# Patient Record
Sex: Male | Born: 1940 | Race: White | Hispanic: No | Marital: Single | Smoking: Former smoker
Health system: Southern US, Community
[De-identification: ages and names within clinical notes are randomized; demographics above are authoritative.]

## PROBLEM LIST (undated history)

## (undated) DIAGNOSIS — F329 Major depressive disorder, single episode, unspecified: Secondary | ICD-10-CM

## (undated) DIAGNOSIS — F419 Anxiety disorder, unspecified: Secondary | ICD-10-CM

## (undated) DIAGNOSIS — F32A Depression, unspecified: Secondary | ICD-10-CM

## (undated) DIAGNOSIS — E7800 Pure hypercholesterolemia, unspecified: Secondary | ICD-10-CM

## (undated) DIAGNOSIS — I10 Essential (primary) hypertension: Secondary | ICD-10-CM

## (undated) HISTORY — PX: WRIST FRACTURE SURGERY: SHX121

## (undated) HISTORY — PX: OTHER SURGICAL HISTORY: SHX169

---

## 1988-02-02 HISTORY — PX: SHOULDER SURGERY: SHX246

## 2008-10-19 ENCOUNTER — Emergency Department (HOSPITAL_COMMUNITY): Admission: EM | Admit: 2008-10-19 | Discharge: 2008-10-19 | Payer: Self-pay | Admitting: Emergency Medicine

## 2008-11-07 ENCOUNTER — Encounter: Admission: RE | Admit: 2008-11-07 | Discharge: 2008-11-07 | Payer: Self-pay | Admitting: Orthopedic Surgery

## 2008-11-27 ENCOUNTER — Encounter: Admission: RE | Admit: 2008-11-27 | Discharge: 2009-01-29 | Payer: Self-pay | Admitting: Neurology

## 2009-02-03 ENCOUNTER — Encounter: Admission: RE | Admit: 2009-02-03 | Discharge: 2009-02-05 | Payer: Self-pay | Admitting: Neurology

## 2009-02-25 ENCOUNTER — Encounter: Admission: RE | Admit: 2009-02-25 | Discharge: 2009-02-25 | Payer: Self-pay | Admitting: *Deleted

## 2009-06-20 ENCOUNTER — Encounter: Admission: RE | Admit: 2009-06-20 | Discharge: 2009-06-20 | Payer: Self-pay | Admitting: *Deleted

## 2010-10-03 IMAGING — CT CT CERVICAL SPINE W/O CM
3 of 4 series · 16 of 28 positions shown, 18 images · non-contrast
Comparison: MRI 11/07/2008.

CLINICAL DATA: Radiculitis.  Evaluate for nonunion.  Right arm
pain.  Prior cervical fusion.

CT CERVICAL SPINE WITHOUT CONTRAST
TECHNIQUE: Multidetector CT imaging of the cervical spine was
performed. Multiplanar CT image reconstructions were also
generated.

[Series 2: c spine bone · axial · 0.27mm/px · z∈[+73,+211]mm · 5 of 83 slices shown, 7 images]
[im 14/83  soft-tissue]
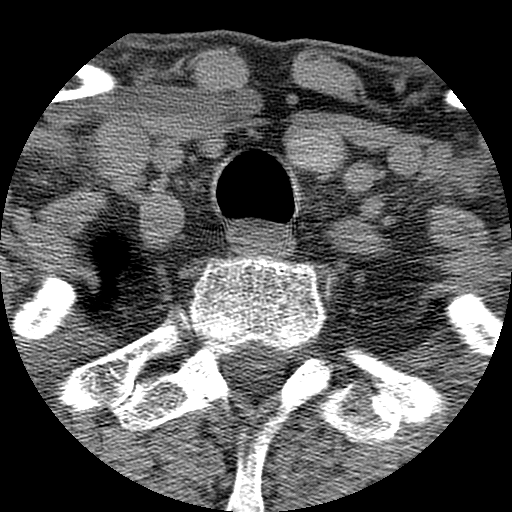
[im 14/83  bone]
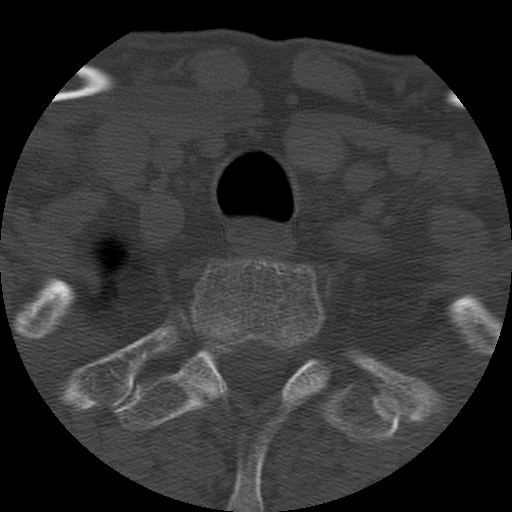
[im 28/83  bone]
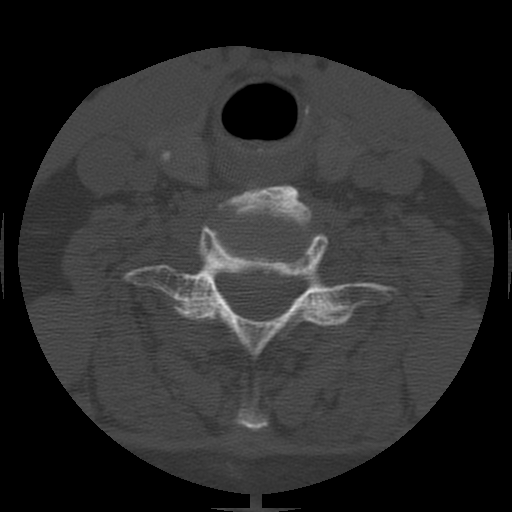
[im 42/83  bone]
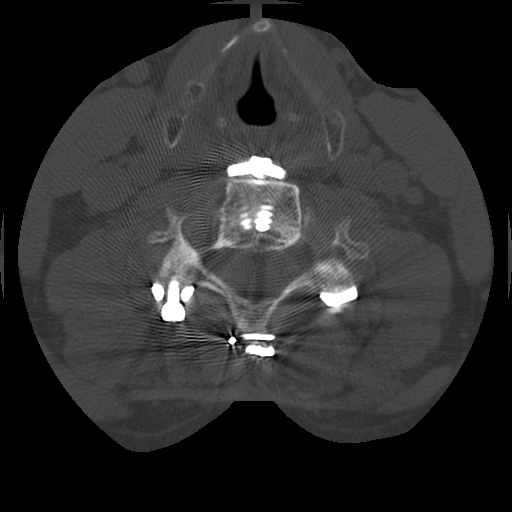
[im 55/83  bone]
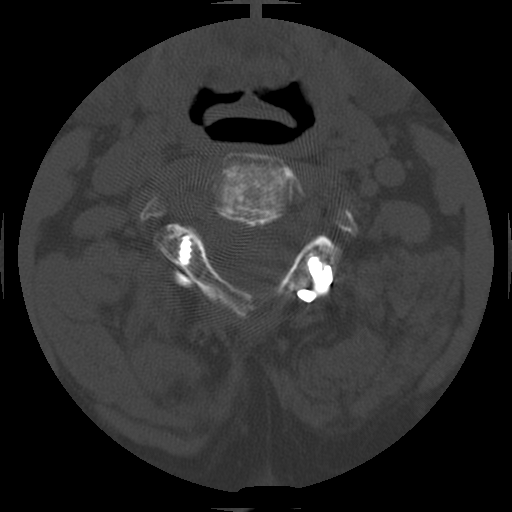
[im 69/83  soft-tissue]
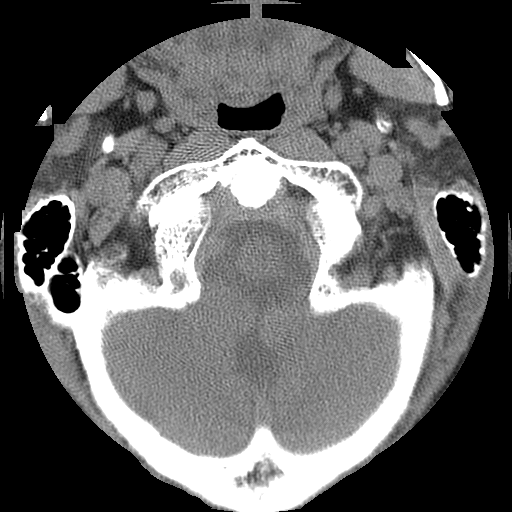
[im 69/83  bone]
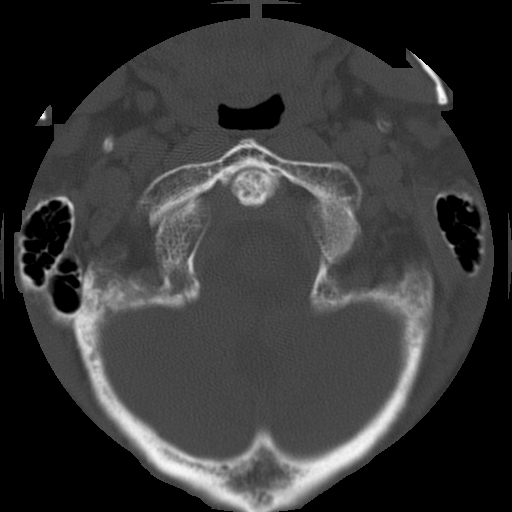

[Series 3: c spine soft · axial · 0.27mm/px · z∈[+68,+211]mm · 5 of 84 slices shown]
[im 14/84  soft-tissue]
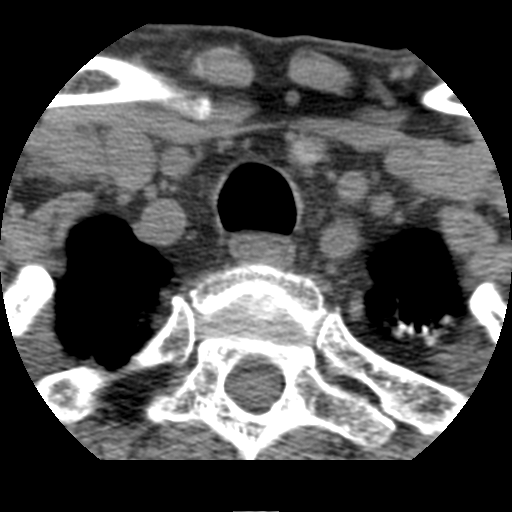
[im 28/84  soft-tissue]
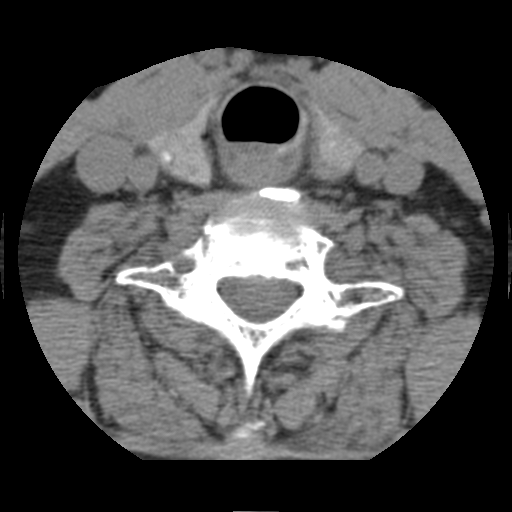
[im 42/84  soft-tissue]
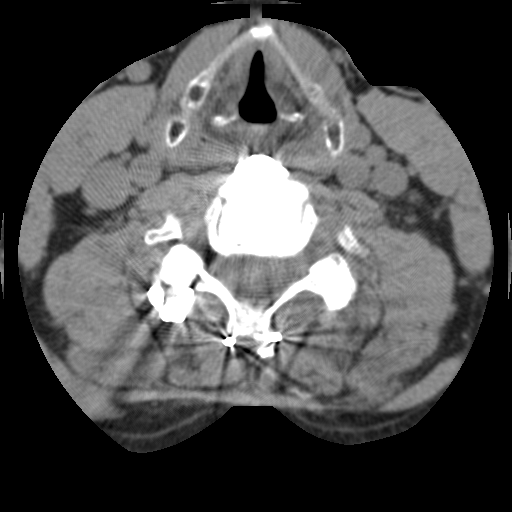
[im 56/84  soft-tissue]
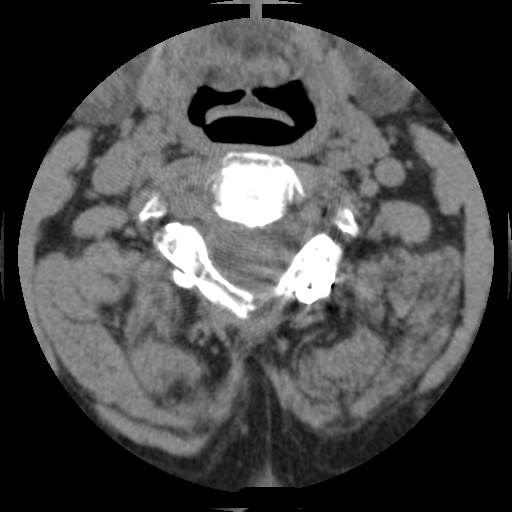
[im 70/84  soft-tissue]
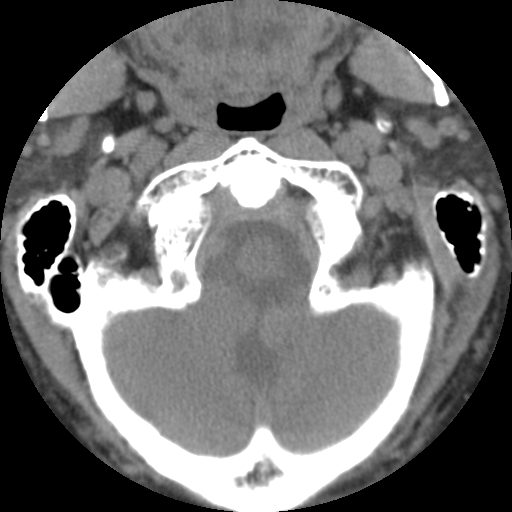

[Series 400: coronal · coronal · 0.42mm/px · 6 of 40 slices shown]
[im 2/40  soft-tissue]
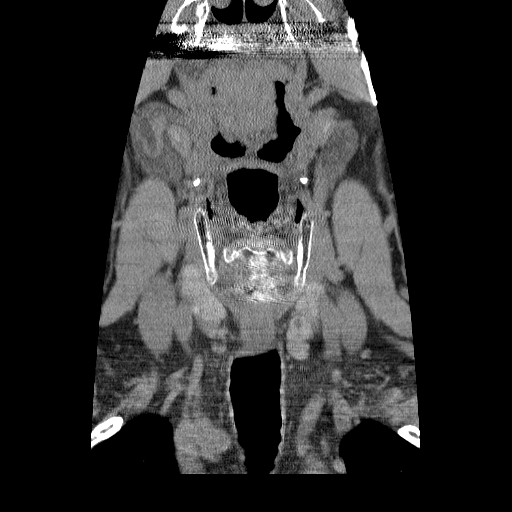
[im 7/40  bone]
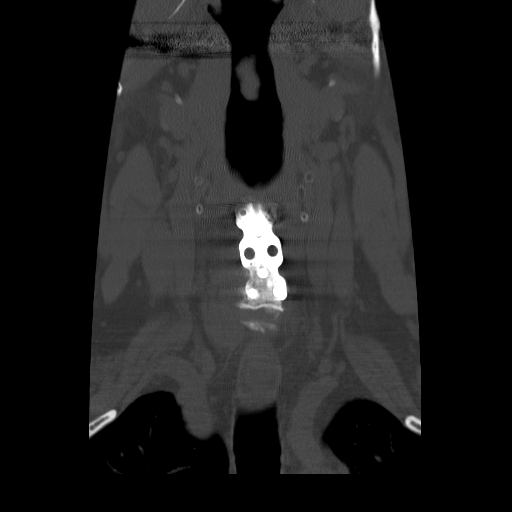
[im 14/40  bone]
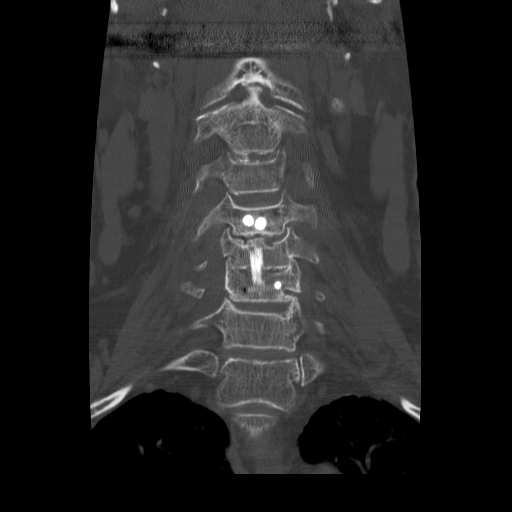
[im 20/40  bone]
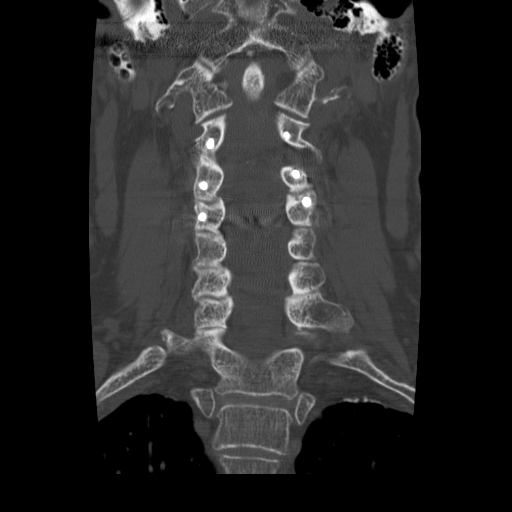
[im 27/40  bone]
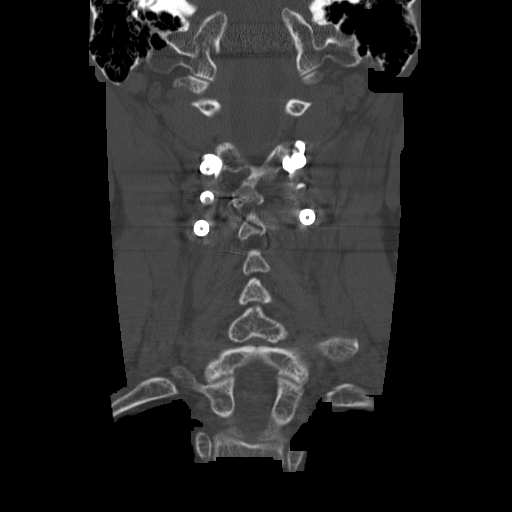
[im 33/40  bone]
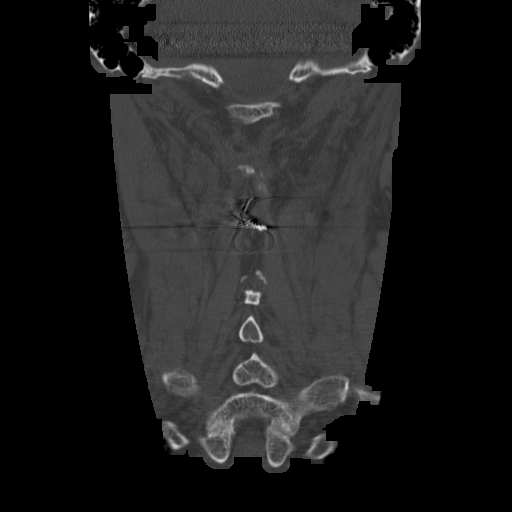

[16 of 28 positions shown; findings below may reference images not displayed]

FINDINGS: Normal cervical alignment.  No fracture or acute bony
abnormality.  Postsurgical changes and fusion are similar to the
prior study.

ACDF of C4-5 and C5-6.  Posterior screw and plate fusion of C2-C4.
Posterior cerclage wires at C3-4.  Hardware is similar to the prior
study.

C2-3:  Disc degeneration and mild spurring without stenosis.

C3-4:  Mild disc degeneration and spurring without stenosis.

C4-5:  Anterior plate and screws are unchanged.  There is lack of
bony fusion at this level compatible with pseudoarthrosis.  This is
unchanged from the CT of 10/19/2008.

C5-6:  Solid fusion.  There is incorporated bone graft material
which is unchanged.

C6-7:  Disc degeneration with broad-based central disc protrusion
and mild uncovertebral spurring.  There is mild spinal stenosis.
This is similar to the prior study.

C7-T1:  Negative

Multiple small thyroid nodules are present bilaterally.  There is
calcification of one of the nodules on the right.  This is
unchanged.
IMPRESSION: There is pseudoarthrosis of the ACDF at C4-5.  There is a solid
fusion at C5-6.  Posterior hardware at C2-C4 is unchanged appears
satisfactory.

Broad-based disc protrusion and spondylosis at C6-7 is unchanged
from the prior study.

## 2013-06-26 ENCOUNTER — Emergency Department (HOSPITAL_COMMUNITY): Payer: No Typology Code available for payment source

## 2013-06-26 ENCOUNTER — Emergency Department (HOSPITAL_COMMUNITY)
Admission: EM | Admit: 2013-06-26 | Discharge: 2013-06-26 | Disposition: A | Discharge status: Home / Self Care | Payer: No Typology Code available for payment source | Attending: Emergency Medicine | Admitting: Emergency Medicine

## 2013-06-26 ENCOUNTER — Encounter (HOSPITAL_COMMUNITY): Payer: Self-pay | Admitting: Emergency Medicine

## 2013-06-26 DIAGNOSIS — Z8639 Personal history of other endocrine, nutritional and metabolic disease: Secondary | ICD-10-CM | POA: Insufficient documentation

## 2013-06-26 DIAGNOSIS — S72409A Unspecified fracture of lower end of unspecified femur, initial encounter for closed fracture: Secondary | ICD-10-CM | POA: Diagnosis not present

## 2013-06-26 DIAGNOSIS — S0990XA Unspecified injury of head, initial encounter: Secondary | ICD-10-CM | POA: Diagnosis not present

## 2013-06-26 DIAGNOSIS — F411 Generalized anxiety disorder: Secondary | ICD-10-CM | POA: Insufficient documentation

## 2013-06-26 DIAGNOSIS — Y92410 Unspecified street and highway as the place of occurrence of the external cause: Secondary | ICD-10-CM | POA: Insufficient documentation

## 2013-06-26 DIAGNOSIS — S199XXA Unspecified injury of neck, initial encounter: Secondary | ICD-10-CM

## 2013-06-26 DIAGNOSIS — S8990XA Unspecified injury of unspecified lower leg, initial encounter: Secondary | ICD-10-CM | POA: Diagnosis not present

## 2013-06-26 DIAGNOSIS — I959 Hypotension, unspecified: Secondary | ICD-10-CM | POA: Insufficient documentation

## 2013-06-26 DIAGNOSIS — Z981 Arthrodesis status: Secondary | ICD-10-CM | POA: Insufficient documentation

## 2013-06-26 DIAGNOSIS — S298XXA Other specified injuries of thorax, initial encounter: Secondary | ICD-10-CM | POA: Diagnosis not present

## 2013-06-26 DIAGNOSIS — S0993XA Unspecified injury of face, initial encounter: Secondary | ICD-10-CM | POA: Diagnosis not present

## 2013-06-26 DIAGNOSIS — S72401A Unspecified fracture of lower end of right femur, initial encounter for closed fracture: Secondary | ICD-10-CM

## 2013-06-26 DIAGNOSIS — Y9389 Activity, other specified: Secondary | ICD-10-CM | POA: Insufficient documentation

## 2013-06-26 DIAGNOSIS — R61 Generalized hyperhidrosis: Secondary | ICD-10-CM | POA: Insufficient documentation

## 2013-06-26 DIAGNOSIS — S3981XA Other specified injuries of abdomen, initial encounter: Secondary | ICD-10-CM | POA: Diagnosis not present

## 2013-06-26 DIAGNOSIS — IMO0002 Reserved for concepts with insufficient information to code with codable children: Secondary | ICD-10-CM | POA: Diagnosis not present

## 2013-06-26 DIAGNOSIS — Z862 Personal history of diseases of the blood and blood-forming organs and certain disorders involving the immune mechanism: Secondary | ICD-10-CM | POA: Insufficient documentation

## 2013-06-26 DIAGNOSIS — M25569 Pain in unspecified knee: Secondary | ICD-10-CM | POA: Diagnosis not present

## 2013-06-26 HISTORY — DX: Depression, unspecified: F32.A

## 2013-06-26 HISTORY — DX: Pure hypercholesterolemia, unspecified: E78.00

## 2013-06-26 HISTORY — DX: Major depressive disorder, single episode, unspecified: F32.9

## 2013-06-26 LAB — I-STAT CHEM 8, ED
BUN: 19 mg/dL (ref 6–23)
CHLORIDE: 103 meq/L (ref 96–112)
Calcium, Ion: 1.16 mmol/L (ref 1.13–1.30)
Creatinine, Ser: 1.4 mg/dL — ABNORMAL HIGH (ref 0.50–1.35)
GLUCOSE: 170 mg/dL — AB (ref 70–99)
HEMATOCRIT: 47 % (ref 39.0–52.0)
HEMOGLOBIN: 16 g/dL (ref 13.0–17.0)
POTASSIUM: 3.2 meq/L — AB (ref 3.7–5.3)
SODIUM: 143 meq/L (ref 137–147)
TCO2: 19 mmol/L (ref 0–100)

## 2013-06-26 LAB — COMPREHENSIVE METABOLIC PANEL
ALBUMIN: 4.4 g/dL (ref 3.5–5.2)
ALT: 18 U/L (ref 0–53)
AST: 19 U/L (ref 0–37)
Alkaline Phosphatase: 63 U/L (ref 39–117)
BILIRUBIN TOTAL: 0.9 mg/dL (ref 0.3–1.2)
BUN: 20 mg/dL (ref 6–23)
CHLORIDE: 103 meq/L (ref 96–112)
CO2: 20 mEq/L (ref 19–32)
CREATININE: 1.29 mg/dL (ref 0.50–1.35)
Calcium: 9.7 mg/dL (ref 8.4–10.5)
GFR calc Af Amer: 63 mL/min — ABNORMAL LOW (ref >90)
GFR calc non Af Amer: 55 mL/min — ABNORMAL LOW (ref >90)
Glucose, Bld: 172 mg/dL — ABNORMAL HIGH (ref 70–99)
POTASSIUM: 3.5 meq/L — AB (ref 3.7–5.3)
Sodium: 140 mEq/L (ref 137–147)
TOTAL PROTEIN: 7.2 g/dL (ref 6.0–8.3)

## 2013-06-26 LAB — SAMPLE TO BLOOD BANK

## 2013-06-26 LAB — CBC
HEMATOCRIT: 44.3 % (ref 39.0–52.0)
Hemoglobin: 15.4 g/dL (ref 13.0–17.0)
MCH: 29 pg (ref 26.0–34.0)
MCHC: 34.8 g/dL (ref 30.0–36.0)
MCV: 83.4 fL (ref 78.0–100.0)
PLATELETS: 133 10*3/uL — AB (ref 150–400)
RBC: 5.31 MIL/uL (ref 4.22–5.81)
RDW: 13.9 % (ref 11.5–15.5)
WBC: 5.4 10*3/uL (ref 4.0–10.5)

## 2013-06-26 LAB — CBG MONITORING, ED
GLUCOSE-CAPILLARY: 176 mg/dL — AB (ref 70–99)
Glucose-Capillary: 112 mg/dL — ABNORMAL HIGH (ref 70–99)

## 2013-06-26 LAB — TROPONIN I: Troponin I: <0.3 ng/mL (ref <0.30)

## 2013-06-26 LAB — ETHANOL: Alcohol, Ethyl (B): <11 mg/dL (ref 0–11)

## 2013-06-26 LAB — PROTIME-INR
INR: 0.97 (ref 0.00–1.49)
Prothrombin Time: 12.7 seconds (ref 11.6–15.2)

## 2013-06-26 LAB — CDS SEROLOGY

## 2013-06-26 MED ORDER — ONDANSETRON 4 MG PO TBDP: 4.0000 mg | ORAL_TABLET | Freq: Four times a day (QID) | ORAL | Status: DC | PRN

## 2013-06-26 MED ORDER — DOCUSATE SODIUM 100 MG PO CAPS: 100.0000 mg | ORAL_CAPSULE | Freq: Two times a day (BID) | ORAL | Status: DC

## 2013-06-26 MED ORDER — ONDANSETRON 4 MG PO TBDP
4.0000 mg | ORAL_TABLET | Freq: Once | ORAL | Status: AC
  Administered 2013-06-26: 4 mg via ORAL
  Filled 2013-06-26: qty 1

## 2013-06-26 MED ORDER — SODIUM CHLORIDE 0.9 % IV BOLUS (SEPSIS)
500.0000 mL | Freq: Once | INTRAVENOUS | Status: AC
  Administered 2013-06-26: 500 mL via INTRAVENOUS

## 2013-06-26 MED ORDER — MORPHINE SULFATE 4 MG/ML IJ SOLN
4.0000 mg | Freq: Once | INTRAMUSCULAR | Status: AC
  Administered 2013-06-26: 4 mg via INTRAVENOUS
  Filled 2013-06-26: qty 1

## 2013-06-26 MED ORDER — IOHEXOL 300 MG/ML  SOLN
80.0000 mL | Freq: Once | INTRAMUSCULAR | Status: AC | PRN
  Administered 2013-06-26: 80 mL via INTRAVENOUS

## 2013-06-26 MED ORDER — ONDANSETRON HCL 4 MG/2ML IJ SOLN
4.0000 mg | Freq: Once | INTRAMUSCULAR | Status: AC
Start: 2013-06-26 — End: 2013-06-26
  Administered 2013-06-26: 4 mg via INTRAVENOUS
  Filled 2013-06-26: qty 2

## 2013-06-26 MED ORDER — OXYCODONE-ACETAMINOPHEN 5-325 MG PO TABS: 1.0000 | ORAL_TABLET | ORAL | Status: DC | PRN

## 2013-06-26 NOTE — Discharge Instructions (Signed)
Fracture A fracture is a break in a bone, due to a force on the bone that is greater than the bone's strength can handle. There are many types of fractures, including:  Complete fracture: The break passes completely through the bone.  Displaced: The ends of the bone fragments are not properly aligned.  Non-displaced: The ends of the bone fragments are in proper alignment.  Incomplete fracture (greenstick): The break does not pass completely through the bone. Incomplete fractures may or may not be angular (angulated).  Open fracture (compound): Part of the broken bone pokes through the skin. Open fractures have a high risk for infection.  Closed fracture: The fracture has not broken through the skin.  Comminuted fracture: The bone is broken into more than two pieces.  Compression fracture: The break occurs from extreme pressure on the bone (includes crushing injury).  Impacted fracture: The broken bone ends have been driven into each other.  Avulsion fracture: A ligament or tendon pulls a small piece of bone off from the main bony segment.  Pathologic fracture: A fracture due to the bone being made weak by a disease (osteoporosis or tumors).  Stress fracture: A fracture caused by intense exercise or repetitive and prolonged pressure that makes the bone weak. SYMPTOMS   Pain, tenderness, bleeding, bruising, and swelling at the fracture site.  Weakness and inability to bear weight on the injured extremity.  Paleness and deformity (sometimes).  Loss of pulse, numbness, tingling, or paralysis below the fracture site (usually a limb); these are emergencies. CAUSES  Bone being subjected to a force greater than its strength. RISK INCREASES WITH:  Contact sports and falls from heights.  Previous or current bone problems (osteoporosis or tumors).  Poor balance.  Poor strength and flexibility. PREVENTION   Warm up and stretch properly before activity.  Maintain physical  fitness:  Cardiovascular fitness.  Muscle strength.  Flexibility and endurance.  Wear proper protective equipment.  Use proper exercise technique. RELATED COMPLICATIONS   Bone fails to heal (nonunion).  Bone heals in a poor position (malunion).  Low blood volume (hypovolemic), shock due to blood loss.  Clump of fat cells travels through the blood (fat embolus) from the injury site to the lungs or brain (more common with thigh fractures).  Obstruction of nearby arteries. TREATMENT  Treatment first requires realigning of the bones (reduction) by a medically trained person, if the fracture is displaced. After realignment if the fracture is completed, or for non-displaced fractures, ice and medicine are used to reduce pain and inflammation. The bone and adjacent joints are then restrained with a splint, cast, or brace to allow the bones to heal without moving. Surgery is sometimes needed, to reposition the bones and hold the position with rods, pins, plates, or screws. Restraint for long periods of time may result in muscle and joint weakness or build up of fluid in tissues (edema). For this reason, physical therapy is often needed to regain strength and full range of motion. Recovery is complete when there is no bone motion at the fracture site and x-rays (radiographs) show complete healing.  MEDICATION   General anesthesia, sedation, or muscle relaxants may be needed to allow for realignment of the fracture. If pain medicine is needed, nonsteroidal anti-inflammatory medicines (aspirin and ibuprofen), or other minor pain relievers (acetaminophen), are often advised.  Do not take pain medicine for 7 days before surgery.  Stronger pain relievers may be prescribed by your caregiver. Use only as directed and only as much   as you need. SEEK MEDICAL CARE IF:   The following occur after restraint or surgery. (Report any of these signs immediately):  Swelling above or below the fracture  site.  Severe, persistent pain.  Blue or gray skin below the fracture site, especially under the nails. Numbness or loss of feeling below the fracture site. Document Released: 01/18/2005 Document Revised: 01/05/2012 Document Reviewed: 05/02/2008 [employer] Patient Information 2014 [employer].  

## 2013-06-26 NOTE — ED Notes (Signed)
RN notified [facility] [id number]

## 2013-06-26 NOTE — ED Notes (Signed)
Pt transported to radiology.

## 2013-06-26 NOTE — Progress Notes (Signed)
Orthopedic Tech Progress Note Patient Details:  Kevin Santiago 1941-01-29 [id number]  Ortho Devices Type of Ortho Device: Crutches;Knee Immobilizer Ortho Device/Splint Interventions: Application   [name] 06/26/2013, 1:40 PM
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
## 2013-06-26 NOTE — ED Notes (Signed)
After getting dressed and walking pt became nauseated, pale, and diaphoretic. Pt placed back in bed and more v/s obtained. HR 69 and BP 137/75. EDP made aware. Zofran given. Will continue to assess.

## 2013-06-26 NOTE — ED Notes (Signed)
After getting dressed Patient stated he felt ill.  Patient became very pale.   Helped  Patient back into stretcher. Notified RN.
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
## 2013-06-26 NOTE — ED Notes (Signed)
Pt reports he was feeling better then he ambulated around the room and became diaphoretic and nauseated again. Pt placed back in bed and EDP notified.

## 2013-06-26 NOTE — ED Provider Notes (Signed)
Ambulated with the tech and had no acute complaints. No nausea, worsening pain, or trouble with the crutches. He feels comfortable going home. Discussed return precautions and will give pain control and he will follow up with ortho  Audree Camel, MD 06/26/13 [phone]

## 2013-06-26 NOTE — ED Notes (Signed)
Notified RN of [facility] 112

## 2013-06-26 NOTE — ED Notes (Signed)
Assisted pt with crutches to ambulate out of the room into the hallway, pt stated " this is more than i've done the first two times."

## 2013-06-28 DIAGNOSIS — M79609 Pain in unspecified limb: Secondary | ICD-10-CM | POA: Diagnosis not present

## 2013-06-30 ENCOUNTER — Emergency Department (INDEPENDENT_AMBULATORY_CARE_PROVIDER_SITE_OTHER)
Admission: EM | Admit: 2013-06-30 | Discharge: 2013-06-30 | Disposition: A | Discharge status: Home / Self Care | Payer: Self-pay | Source: Home / Self Care | Attending: Family Medicine | Admitting: Family Medicine

## 2013-06-30 ENCOUNTER — Encounter (HOSPITAL_COMMUNITY): Payer: Self-pay | Admitting: Emergency Medicine

## 2013-06-30 DIAGNOSIS — K625 Hemorrhage of anus and rectum: Secondary | ICD-10-CM

## 2013-06-30 DIAGNOSIS — K649 Unspecified hemorrhoids: Secondary | ICD-10-CM

## 2013-06-30 MED ORDER — HYDROCORTISONE 2.5 % RE CREA: 1.0000 "application " | TOPICAL_CREAM | Freq: Two times a day (BID) | RECTAL | Status: DC

## 2013-06-30 NOTE — Discharge Instructions (Signed)
Hemorrhoids Hemorrhoids are puffy (swollen) veins around the rectum or anus. Hemorrhoids can cause pain, itching, bleeding, or irritation. HOME CARE  Eat foods with fiber, such as whole grains, beans, nuts, fruits, and vegetables. Ask your doctor about taking products with added fiber in them (fibersupplements).  Drink enough fluid to keep your pee (urine) clear or pale yellow.  Exercise often.  Go to the bathroom when you have the urge to poop. Do not wait.  Avoid straining to poop (bowel movement).  Keep the butt area dry and clean. Use wet toilet paper or moist paper towels.  Medicated creams and medicine inserted into the anus (anal suppository) may be used or applied as told.  Only take medicine as told by your doctor.  Take a warm water bath (sitz bath) for 15 20 minutes to ease pain. Do this 3 4 times a day.  Place ice packs on the area if it is tender or puffy. Use the ice packs between the warm water baths.  Put ice in a plastic bag.  Place a towel between your skin and the bag.  Leave the ice on for 15-20 minutes, 03-04 times a day.  Do not use a donut-shaped pillow or sit on the toilet for a long time. GET HELP RIGHT AWAY IF:   You have more pain that is not controlled by treatment or medicine.  You have bleeding that will not stop.  You have trouble or are unable to poop (bowel movement).  You have pain or puffiness outside the area of the hemorrhoids. MAKE SURE YOU:   Understand these instructions.  Will watch your condition.  Will get help right away if you are not doing well or get worse. Document Released: 10/28/2007 Document Revised: 01/05/2012 Document Reviewed: 11/30/2011 [facility] Patient Information 2014 [place], [place].  Treatment of constipation is the mainstay. Continue use of stool softner. Drink a lot of fluids and increase fiber in diet all of which are essential. Should the bleeding continue out past 1-2 weeks, keep f/u with primary  doctor.
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
## 2013-07-01 NOTE — ED Provider Notes (Signed)
Medical screening examination/treatment/procedure(s) were performed by resident physician or non-physician practitioner and as supervising physician I was immediately available for consultation/collaboration.   Barkley Bruns MD.   Linna Hoff, MD 07/01/13 [phone]

## 2013-07-12 DIAGNOSIS — IMO0002 Reserved for concepts with insufficient information to code with codable children: Secondary | ICD-10-CM | POA: Diagnosis not present

## 2013-07-20 DIAGNOSIS — M1710 Unilateral primary osteoarthritis, unspecified knee: Secondary | ICD-10-CM | POA: Diagnosis not present

## 2013-08-28 DIAGNOSIS — M1710 Unilateral primary osteoarthritis, unspecified knee: Secondary | ICD-10-CM | POA: Diagnosis not present

## 2014-05-13 DIAGNOSIS — M2241 Chondromalacia patellae, right knee: Secondary | ICD-10-CM | POA: Diagnosis not present

## 2014-07-01 ENCOUNTER — Emergency Department (HOSPITAL_COMMUNITY): Payer: Medicare Other

## 2014-07-01 ENCOUNTER — Inpatient Hospital Stay (HOSPITAL_COMMUNITY)
Admission: EM | Admit: 2014-07-01 | Discharge: 2014-07-05 | DRG: 814 | Disposition: A | Discharge status: Home / Self Care | Payer: Medicare Other | Attending: Internal Medicine | Admitting: Internal Medicine

## 2014-07-01 ENCOUNTER — Encounter (HOSPITAL_COMMUNITY): Payer: Self-pay | Admitting: Nurse Practitioner

## 2014-07-01 DIAGNOSIS — K661 Hemoperitoneum: Secondary | ICD-10-CM | POA: Diagnosis present

## 2014-07-01 DIAGNOSIS — D72819 Decreased white blood cell count, unspecified: Secondary | ICD-10-CM | POA: Diagnosis not present

## 2014-07-01 DIAGNOSIS — S36030A Superficial (capsular) laceration of spleen, initial encounter: Principal | ICD-10-CM | POA: Diagnosis present

## 2014-07-01 DIAGNOSIS — Z91013 Allergy to seafood: Secondary | ICD-10-CM | POA: Diagnosis not present

## 2014-07-01 DIAGNOSIS — D61818 Other pancytopenia: Secondary | ICD-10-CM | POA: Diagnosis not present

## 2014-07-01 DIAGNOSIS — D709 Neutropenia, unspecified: Secondary | ICD-10-CM | POA: Diagnosis present

## 2014-07-01 DIAGNOSIS — Z880 Allergy status to penicillin: Secondary | ICD-10-CM

## 2014-07-01 DIAGNOSIS — N400 Enlarged prostate without lower urinary tract symptoms: Secondary | ICD-10-CM | POA: Diagnosis present

## 2014-07-01 DIAGNOSIS — E780 Pure hypercholesterolemia: Secondary | ICD-10-CM | POA: Diagnosis present

## 2014-07-01 DIAGNOSIS — R109 Unspecified abdominal pain: Secondary | ICD-10-CM | POA: Diagnosis not present

## 2014-07-01 DIAGNOSIS — D696 Thrombocytopenia, unspecified: Secondary | ICD-10-CM | POA: Diagnosis present

## 2014-07-01 DIAGNOSIS — F329 Major depressive disorder, single episode, unspecified: Secondary | ICD-10-CM | POA: Diagnosis present

## 2014-07-01 DIAGNOSIS — E785 Hyperlipidemia, unspecified: Secondary | ICD-10-CM | POA: Diagnosis present

## 2014-07-01 DIAGNOSIS — E559 Vitamin D deficiency, unspecified: Secondary | ICD-10-CM | POA: Diagnosis present

## 2014-07-01 DIAGNOSIS — S36031A Moderate laceration of spleen, initial encounter: Secondary | ICD-10-CM | POA: Diagnosis not present

## 2014-07-01 DIAGNOSIS — R112 Nausea with vomiting, unspecified: Secondary | ICD-10-CM | POA: Diagnosis not present

## 2014-07-01 DIAGNOSIS — R58 Hemorrhage, not elsewhere classified: Secondary | ICD-10-CM | POA: Diagnosis not present

## 2014-07-01 DIAGNOSIS — S3600XA Unspecified injury of spleen, initial encounter: Secondary | ICD-10-CM | POA: Diagnosis present

## 2014-07-01 DIAGNOSIS — R161 Splenomegaly, not elsewhere classified: Secondary | ICD-10-CM | POA: Diagnosis not present

## 2014-07-01 DIAGNOSIS — N179 Acute kidney failure, unspecified: Secondary | ICD-10-CM | POA: Diagnosis present

## 2014-07-01 DIAGNOSIS — R7303 Prediabetes: Secondary | ICD-10-CM | POA: Diagnosis present

## 2014-07-01 DIAGNOSIS — R509 Fever, unspecified: Secondary | ICD-10-CM | POA: Diagnosis not present

## 2014-07-01 DIAGNOSIS — R1012 Left upper quadrant pain: Secondary | ICD-10-CM | POA: Diagnosis not present

## 2014-07-01 LAB — URINE MICROSCOPIC-ADD ON

## 2014-07-01 LAB — TYPE AND SCREEN
ABO/RH(D): A POS
ANTIBODY SCREEN: NEGATIVE

## 2014-07-01 LAB — COMPREHENSIVE METABOLIC PANEL
ALK PHOS: 76 U/L (ref 38–126)
ALT: 52 U/L (ref 17–63)
ANION GAP: 10 (ref 5–15)
AST: 60 U/L — ABNORMAL HIGH (ref 15–41)
Albumin: 4.2 g/dL (ref 3.5–5.0)
BILIRUBIN TOTAL: 2.5 mg/dL — AB (ref 0.3–1.2)
BUN: 17 mg/dL (ref 6–20)
CO2: 25 mmol/L (ref 22–32)
CREATININE: 1.58 mg/dL — AB (ref 0.61–1.24)
Calcium: 8.8 mg/dL — ABNORMAL LOW (ref 8.9–10.3)
Chloride: 102 mmol/L (ref 101–111)
GFR calc non Af Amer: 42 mL/min — ABNORMAL LOW (ref >60)
GFR, EST AFRICAN AMERICAN: 49 mL/min — AB (ref >60)
Glucose, Bld: 136 mg/dL — ABNORMAL HIGH (ref 65–99)
Potassium: 3.9 mmol/L (ref 3.5–5.1)
SODIUM: 137 mmol/L (ref 135–145)
Total Protein: 7 g/dL (ref 6.5–8.1)

## 2014-07-01 LAB — I-STAT CHEM 8, ED
BUN: 21 mg/dL — AB (ref 6–20)
CHLORIDE: 102 mmol/L (ref 101–111)
Calcium, Ion: 1.17 mmol/L (ref 1.13–1.30)
Creatinine, Ser: 1.6 mg/dL — ABNORMAL HIGH (ref 0.61–1.24)
GLUCOSE: 119 mg/dL — AB (ref 65–99)
HCT: 44 % (ref 39.0–52.0)
HEMOGLOBIN: 15 g/dL (ref 13.0–17.0)
POTASSIUM: 3.6 mmol/L (ref 3.5–5.1)
SODIUM: 137 mmol/L (ref 135–145)
TCO2: 21 mmol/L (ref 0–100)

## 2014-07-01 LAB — CBC WITH DIFFERENTIAL/PLATELET
BASOS PCT: 0 % (ref 0–1)
Basophils Absolute: 0 10*3/uL (ref 0.0–0.1)
EOS ABS: 0 10*3/uL (ref 0.0–0.7)
Eosinophils Relative: 0 % (ref 0–5)
HCT: 42.9 % (ref 39.0–52.0)
Hemoglobin: 14.7 g/dL (ref 13.0–17.0)
LYMPHS ABS: 0.5 10*3/uL — AB (ref 0.7–4.0)
Lymphocytes Relative: 13 % (ref 12–46)
MCH: 28.2 pg (ref 26.0–34.0)
MCHC: 34.3 g/dL (ref 30.0–36.0)
MCV: 82.2 fL (ref 78.0–100.0)
MONO ABS: 0.2 10*3/uL (ref 0.1–1.0)
Monocytes Relative: 5 % (ref 3–12)
NEUTROS ABS: 3.2 10*3/uL (ref 1.7–7.7)
Neutrophils Relative %: 82 % — ABNORMAL HIGH (ref 43–77)
Platelets: 65 10*3/uL — ABNORMAL LOW (ref 150–400)
RBC: 5.22 MIL/uL (ref 4.22–5.81)
RDW: 13.8 % (ref 11.5–15.5)
WBC: 3.9 10*3/uL — AB (ref 4.0–10.5)

## 2014-07-01 LAB — URINALYSIS, ROUTINE W REFLEX MICROSCOPIC
GLUCOSE, UA: NEGATIVE mg/dL
HGB URINE DIPSTICK: NEGATIVE
Ketones, ur: 15 mg/dL — AB
NITRITE: NEGATIVE
PROTEIN: 30 mg/dL — AB
Specific Gravity, Urine: 1.028 (ref 1.005–1.030)
Urobilinogen, UA: 1 mg/dL (ref 0.0–1.0)
pH: 5 (ref 5.0–8.0)

## 2014-07-01 LAB — APTT: aPTT: 29 seconds (ref 24–37)

## 2014-07-01 LAB — RETICULOCYTES
RBC.: 4.62 MIL/uL (ref 4.22–5.81)
RETIC COUNT ABSOLUTE: 46.2 10*3/uL (ref 19.0–186.0)
Retic Ct Pct: 1 % (ref 0.4–3.1)

## 2014-07-01 LAB — PROTIME-INR
INR: 1.25 (ref 0.00–1.49)
PROTHROMBIN TIME: 15.9 s — AB (ref 11.6–15.2)

## 2014-07-01 LAB — SAVE SMEAR

## 2014-07-01 LAB — BILIRUBIN, FRACTIONATED(TOT/DIR/INDIR)
Bilirubin, Direct: 0.5 mg/dL (ref 0.1–0.5)
Indirect Bilirubin: 1.1 mg/dL — ABNORMAL HIGH (ref 0.3–0.9)
Total Bilirubin: 1.6 mg/dL — ABNORMAL HIGH (ref 0.3–1.2)

## 2014-07-01 LAB — I-STAT TROPONIN, ED: Troponin i, poc: 0 ng/mL (ref 0.00–0.08)

## 2014-07-01 LAB — LIPASE, BLOOD: Lipase: 26 U/L (ref 22–51)

## 2014-07-01 LAB — ABO/RH: ABO/RH(D): A POS

## 2014-07-01 MED ORDER — ONDANSETRON HCL 4 MG/2ML IJ SOLN
4.0000 mg | Freq: Once | INTRAMUSCULAR | Status: AC | PRN
Start: 2014-07-01 — End: 2014-07-01
  Administered 2014-07-01: 4 mg via INTRAVENOUS
  Filled 2014-07-01: qty 2

## 2014-07-01 MED ORDER — ACETAMINOPHEN 650 MG RE SUPP: 650.0000 mg | Freq: Four times a day (QID) | RECTAL | Status: DC | PRN

## 2014-07-01 MED ORDER — IOHEXOL 300 MG/ML  SOLN
80.0000 mL | Freq: Once | INTRAMUSCULAR | Status: AC | PRN
  Administered 2014-07-01: 100 mL via INTRAVENOUS

## 2014-07-01 MED ORDER — MORPHINE SULFATE 2 MG/ML IJ SOLN
2.0000 mg | Freq: Once | INTRAMUSCULAR | Status: AC
  Administered 2014-07-01: 2 mg via INTRAVENOUS
  Filled 2014-07-01: qty 1

## 2014-07-01 MED ORDER — PRAVASTATIN SODIUM 20 MG PO TABS
20.0000 mg | ORAL_TABLET | Freq: Every day | ORAL | Status: DC
Start: 2014-07-01 — End: 2014-07-05
  Administered 2014-07-01 – 2014-07-04 (×4): 20 mg via ORAL
  Filled 2014-07-01 (×5): qty 1

## 2014-07-01 MED ORDER — SODIUM CHLORIDE 0.9 % IV BOLUS (SEPSIS)
1000.0000 mL | Freq: Once | INTRAVENOUS | Status: AC
  Administered 2014-07-01: 1000 mL via INTRAVENOUS

## 2014-07-01 MED ORDER — IOHEXOL 300 MG/ML  SOLN
25.0000 mL | Freq: Once | INTRAMUSCULAR | Status: AC | PRN
  Administered 2014-07-01: 25 mL via ORAL

## 2014-07-01 MED ORDER — SODIUM CHLORIDE 0.9 % IV SOLN
INTRAVENOUS | Status: DC
  Administered 2014-07-01 – 2014-07-02 (×2): via INTRAVENOUS

## 2014-07-01 MED ORDER — SERTRALINE HCL 25 MG PO TABS
25.0000 mg | ORAL_TABLET | Freq: Every day | ORAL | Status: DC
  Administered 2014-07-01 – 2014-07-04 (×4): 25 mg via ORAL
  Filled 2014-07-01 (×5): qty 1

## 2014-07-01 MED ORDER — SODIUM CHLORIDE 0.9 % IV SOLN: Freq: Once | INTRAVENOUS | Status: DC

## 2014-07-01 MED ORDER — ACETAMINOPHEN 325 MG PO TABS
650.0000 mg | ORAL_TABLET | Freq: Four times a day (QID) | ORAL | Status: DC | PRN
  Administered 2014-07-01 – 2014-07-04 (×7): 650 mg via ORAL
  Filled 2014-07-01 (×7): qty 2

## 2014-07-01 MED ORDER — TAMSULOSIN HCL 0.4 MG PO CAPS
0.4000 mg | ORAL_CAPSULE | Freq: Every day | ORAL | Status: DC
  Administered 2014-07-01 – 2014-07-04 (×4): 0.4 mg via ORAL
  Filled 2014-07-01 (×5): qty 1

## 2014-07-01 MED ORDER — MORPHINE SULFATE 4 MG/ML IJ SOLN
4.0000 mg | Freq: Once | INTRAMUSCULAR | Status: AC
  Administered 2014-07-01: 4 mg via INTRAVENOUS
  Filled 2014-07-01: qty 1

## 2014-07-01 NOTE — ED Notes (Signed)
Dr. Goldston at bedside.  

## 2014-07-01 NOTE — ED Notes (Signed)
Pt sts Saturday legs started having generalized pain with subjective  fevers and chills, on Sunday pt was unable to leave the house due to pain being all over. Pt sts this morning had LMQ or abdomen pain, worse with deep inspiration, denies injury. Patient has had 2 episodes of vomiting since abd pain started this morning. Pt endorses odd taste in mouth.

## 2014-07-01 NOTE — ED Provider Notes (Signed)
CSN: [id number]     Arrival date & time 07/01/14  4098 History   First MD Initiated Contact with Patient 07/01/14 1002     Chief Complaint  Patient presents with  . Abdominal Pain     (Consider location/radiation/quality/duration/timing/severity/associated sxs/prior Treatment) HPI  74 year old male presents with left upper quadrant abdominal pain that started this morning. 2 days ago he started feeling generalized body aches like he was having the flu subjective fever and chills but then this morning it seems like the aching has resolved but now he has left upper quadrant abdominal pain. He is not having any shortness of breath but the pain worsens with deep inspiration. Has had nausea and has vomited twice this morning. No diarrhea. No chest pain. Denies any back pain or urinary symptoms. Has not taken anything for the pain.  Past Medical History  Diagnosis Date  . Depression   . Hypercholesteremia    Past Surgical History  Procedure Laterality Date  . Cervical fusion    . Wrist fracture surgery     No family history on file. History  Substance Use Topics  . Smoking status: Never Smoker   . Smokeless tobacco: Not on file  . Alcohol Use: Yes     Comment: ocassional    Review of Systems  Constitutional: Negative for fever.  Respiratory: Negative for shortness of breath.   Cardiovascular: Negative for chest pain.  Gastrointestinal: Positive for nausea, vomiting and abdominal pain. Negative for diarrhea.  Genitourinary: Negative for dysuria and hematuria.  Musculoskeletal: Positive for arthralgias.      Allergies  Fish-derived products and Penicillins  Home Medications   Prior to Admission medications   Medication Sig Start Date End Date Taking? Authorizing Provider  aspirin EC 81 MG tablet Take 81 mg by mouth daily.    Historical Provider, MD  cholecalciferol (VITAMIN D) 1000 UNITS tablet Take 1,000 Units by mouth at bedtime.    Historical Provider, MD  docusate  sodium (COLACE) 100 MG capsule Take 1 capsule (100 mg total) by mouth every 12 (twelve) hours. 06/26/13   Pricilla Loveless, MD  hydrocortisone (ANUSOL-HC) 2.5 % rectal cream Place 1 application rectally 2 (two) times daily. 06/30/13   Riki Sheer, PA-C  hydroxypropyl methylcellulose (ISOPTO TEARS) 2.5 % ophthalmic solution Place 2 drops into both eyes 2 (two) times daily as needed for dry eyes.    Historical Provider, MD  ondansetron (ZOFRAN ODT) 4 MG disintegrating tablet Take 1 tablet (4 mg total) by mouth every 6 (six) hours as needed for nausea or vomiting. 06/26/13   Pricilla Loveless, MD  oxyCODONE-acetaminophen (PERCOCET/ROXICET) 5-325 MG per tablet Take 1 tablet by mouth every 4 (four) hours as needed for severe pain. 06/26/13   Zadie Rhine, MD  pravastatin (PRAVACHOL) 20 MG tablet Take 20 mg by mouth at bedtime.    Historical Provider, MD  sertraline (ZOLOFT) 25 MG tablet Take 25 mg by mouth at bedtime.    Historical Provider, MD  tacrolimus (PROGRAF) 1 MG capsule Take 1 mg by mouth at bedtime.    Historical Provider, MD   BP 103/62 mmHg  Pulse 60  Temp(Src) 97.7 F (36.5 C) (Oral)  Resp 25  Ht  (1.778 m)  Wt 180 lb (81.647 kg)  BMI 25.83 kg/m2  SpO2 96% Physical Exam  Constitutional: He is oriented to person, place, and time. He appears well-developed and well-nourished. No distress.  HENT:  Head: Normocephalic and atraumatic.  Right Ear: External ear normal.  Left  Ear: External ear normal.  Nose: Nose normal.  Eyes: Right eye exhibits no discharge. Left eye exhibits no discharge.  Neck: Neck supple.  Cardiovascular: Normal rate, regular rhythm, normal heart sounds and intact distal pulses.   Pulmonary/Chest: Effort normal and breath sounds normal. He has no wheezes. He has no rales. He exhibits no tenderness.  Abdominal: Soft. There is tenderness (LUQ tenderness, palpation anywhere causes referred pain to LUQ).  Musculoskeletal: He exhibits no edema.  Neurological: He  is alert and oriented to person, place, and time.  Skin: Skin is warm and dry.  Nursing note and vitals reviewed.   ED Course  Procedures (including critical care time) Labs Review Labs Reviewed  CBC WITH DIFFERENTIAL/PLATELET - Abnormal; Notable for the following:    WBC 3.9 (*)    Platelets 65 (*)    Neutrophils Relative % 82 (*)    Lymphs Abs 0.5 (*)    All other components within normal limits  COMPREHENSIVE METABOLIC PANEL - Abnormal; Notable for the following:    Glucose, Bld 136 (*)    Creatinine, Ser 1.58 (*)    Calcium 8.8 (*)    AST 60 (*)    Total Bilirubin 2.5 (*)    GFR calc non Af Amer 42 (*)    GFR calc Af Amer 49 (*)    All other components within normal limits  URINALYSIS, ROUTINE W REFLEX MICROSCOPIC (NOT AT [facility]) - Abnormal; Notable for the following:    Color, Urine ORANGE (*)    Bilirubin Urine SMALL (*)    Ketones, ur 15 (*)    Protein, ur 30 (*)    Leukocytes, UA TRACE (*)    All other components within normal limits  URINE MICROSCOPIC-ADD ON - Abnormal; Notable for the following:    Casts HYALINE CASTS (*)    All other components within normal limits  I-STAT CHEM 8, ED - Abnormal; Notable for the following:    BUN 21 (*)    Creatinine, Ser 1.60 (*)    Glucose, Bld 119 (*)    All other components within normal limits  LIPASE, BLOOD  PROTIME-INR  I-STAT TROPOININ, ED  I-STAT CHEM 8, ED  TYPE AND SCREEN    Imaging Review Ct Abdomen Pelvis W Contrast  07/01/2014   CLINICAL DATA:  General body aches, pain and fever.  EXAM: CT ABDOMEN AND PELVIS WITH CONTRAST  TECHNIQUE: Multidetector CT imaging of the abdomen and pelvis was performed using the standard protocol following bolus administration of intravenous contrast.  CONTRAST:  100mL OMNIPAQUE IOHEXOL 300 MG/ML  SOLN  COMPARISON:  06/26/2013  FINDINGS: Lower chest: The lung bases are clear. There is no pleural effusion.  Hepatobiliary: Small low-attenuation foci within the liver are noted. The  largest measures 5 mm and is unchanged from previous exam, likely cyst. The spleen is normal. The gallbladder is normal. No biliary dilatation.  Pancreas: Negative  Spleen: Progressive splenomegaly. The spleen currently measures 16 cm in length. There is a subtle focus of low attenuation within the mid spleen, image 27/ series 201.  Adrenals/Urinary Tract: The adrenal glands are normal. Unremarkable appearance of the left kidney. There is a cyst within the right kidney measuring 1.1 cm, image 30/ series 201. The urinary bladder appears normal.  Stomach/Bowel: The stomach and the small bowel loops have a normal course and caliber. There is no evidence for bowel obstruction. Normal appearance of the colon.  Vascular/Lymphatic: Normal appearance of the abdominal aorta. No enlarged retroperitoneal or mesenteric  adenopathy. No enlarged pelvic or inguinal lymph nodes.  Reproductive: Prostate gland and seminal vesicles are normal.  Other: There is high attenuation fluid identified around the liver and spleen which is concerning for hemo peritoneum.  Musculoskeletal: No acute bone abnormalities noted.  IMPRESSION: 1. There is high attenuation fluid around the liver and spleen consistent with hemo peritoneum. 2. Subtle area of low attenuation within the enlarged left spleen which may indicate a small grade 1 laceration. This may explain the presence of hemo peritoneum. Alternatively, the patient is noted have very low platelets which could result in spontaneous hemorrhage. Further workup advised. 3. Critical Value/emergent results were called by telephone at the time of interpretation on 07/01/2014 at 12:00 pm to Dr. Pricilla Loveless , who verbally acknowledged these results.   Electronically Signed   By: Signa Kell M.D.   On: 07/01/2014 12:00     EKG Interpretation None     ED ECG REPORT   Date: 07/01/2014  Rate: 64  Rhythm: normal sinus rhythm  QRS Axis: normal  Intervals: normal  ST/T Wave abnormalities:  normal  Conduction Disutrbances:left anterior fascicular block  Narrative Interpretation:   Old EKG Reviewed: unchanged  I have personally reviewed the EKG tracing and agree with the computerized printout as noted.  MDM   Final diagnoses:  Splenomegaly  Thrombocytopenia  Hemoperitoneum    Patient with an apparent atraumatic laceration of spleen with hemoperitoneum. Hemoglobin is normal. Platelets are low and has splenomegaly, concern for new cancer/bone marrow disorder. Is currently stable, but will need further workup and monitoring with serial abdominal exam. Will admit to medicine, surgery consulted.    Pricilla Loveless, MD 07/01/14 [phone]
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
## 2014-07-01 NOTE — Plan of Care (Signed)
Problem: Phase I Progression Outcomes Goal: Initial discharge plan identified Outcome: Completed/Met Date Met:  07/01/14 Return home alone when medically cleared

## 2014-07-01 NOTE — H&P (Signed)
Date: 07/01/2014               Patient Name:  Kevin Santiago MRN: [id number]  DOB: 03/29/1940 Age / Sex: 74 y.o., male   PCP: No primary care provider on file.         Medical Service: Internal Medicine Teaching Service         Attending Physician: Dr. Inez CatalinaEmily B Mullen, MD    First Contact: Dr. Isabella BowensKrall Pager: [phone]  Second Contact: Dr. Yetta BarreJones Pager: [phone]       After Hours (After 5p/  First Contact Pager: [phone]  weekends / holidays): Second Contact Pager: [phone]   Chief Complaint: Abdominal pain  History of Present Illness: Kevin Santiago is a 74 year old man with history of HLD and depression presenting with abdominal pain. He had subjective fevers on Saturday night. He reports myalgias Saturday. Initially felt like he was dehydrated. This resolved yesterday after taking a shower. This morning, he started having a LUQ abdominal pain that is sharp and constant. Worse with breathing. No relieving factors. Denies trauma. Denies previous episodes. He also reports diaphoresis, dizziness, pre-syncope, emesis. Denies cough, hemetemesis, diarrhea, hematochezia, melena, dysuria, hematuria, edema.  Meds: No current facility-administered medications for this encounter.   Current Outpatient Prescriptions  Medication Sig Dispense Refill  . aspirin EC 81 MG tablet Take 81 mg by mouth daily.    . cholecalciferol (VITAMIN D) 1000 UNITS tablet Take 1,000 Units by mouth at bedtime.    . docusate sodium (COLACE) 100 MG capsule Take 1 capsule (100 mg total) by mouth every 12 (twelve) hours. 60 capsule 0  . hydrocortisone (ANUSOL-HC) 2.5 % rectal cream Place 1 application rectally 2 (two) times daily. 30 g 0  . hydroxypropyl methylcellulose (ISOPTO TEARS) 2.5 % ophthalmic solution Place 2 drops into both eyes 2 (two) times daily as needed for dry eyes.    Marland Kitchen. ondansetron (ZOFRAN ODT) 4 MG disintegrating tablet Take 1 tablet (4 mg total) by mouth every 6 (six) hours as needed for nausea or vomiting.  20 tablet 0  . oxyCODONE-acetaminophen (PERCOCET/ROXICET) 5-325 MG per tablet Take 1 tablet by mouth every 4 (four) hours as needed for severe pain. 20 tablet 0  . pravastatin (PRAVACHOL) 20 MG tablet Take 20 mg by mouth at bedtime.    . sertraline (ZOLOFT) 25 MG tablet Take 25 mg by mouth at bedtime.    . tacrolimus (PROGRAF) 1 MG capsule Take 1 mg by mouth at bedtime.      Allergies: Allergies as of 07/01/2014 - Review Complete 07/01/2014  Allergen Reaction Noted  . Fish-derived products Rash 06/26/2013  . Penicillins Itching and Rash 06/26/2013   Past Medical History  Diagnosis Date  . Depression   . Hypercholesteremia    Past Surgical History  Procedure Laterality Date  . Cervical fusion    . Wrist fracture surgery     No family history on file. History   Social History  . Marital Status: Single    Spouse Name: N/A  . Number of Children: N/A  . Years of Education: N/A   Occupational History  . Not on file.   Social History Main Topics  . Smoking status: Never Smoker   . Smokeless tobacco: Not on file  . Alcohol Use: Yes     Comment: ocassional  . Drug Use: No  . Sexual Activity: Yes   Other Topics Concern  . Not on file   Social History Narrative    Review  of Systems: Constitutional: +subjective fevers/chills Eyes: no vision changes Ears, nose, mouth, throat, and face: no cough Respiratory: no shortness of breath Cardiovascular: no chest pain Gastrointestinal: +nausea/vomiting, +abdominal pain, no constipation, no diarrhea Genitourinary: no dysuria, no hematuria Integument: no rash Hematologic/lymphatic: no bleeding/bruising, no edema Musculoskeletal: no arthralgias, no myalgias Neurological: no paresthesias, no weakness  Physical Exam: Blood pressure 118/65, pulse 63, temperature 97.7 F (36.5 C), temperature source Oral, resp. rate 18, height  (1.778 m), weight 180 lb (81.647 kg), SpO2 95 %. General Apperance: NAD Head: Normocephalic,  atraumatic Eyes: PERRL, EOMI, anicteric sclera Ears: Normal external ear canal Nose: Nares normal, septum midline, mucosa normal Throat: Lips, mucosa and tongue normal  Neck: Supple, trachea midline Back: No tenderness or bony abnormality  Lungs: Clear to auscultation bilaterally. No wheezes, rhonchi or rales. Breathing comfortably Chest Wall: Nontender, no deformity Heart: Regular rate and rhythm, no murmur/rub/gallop Abdomen: Soft, LUQ abdominal pain, nondistended, no rebound/guarding Extremities: Normal, atraumatic, warm and well perfused, no edema Pulses: 2+ throughout Skin: No rashes or lesions Neurologic: Alert and oriented x 3. CNII-XII intact. Normal strength and sensation  Lab results: Basic Metabolic Panel:  Recent Labs  16/10/96 1010 07/01/14 1301  NA 137 137  K 3.9 3.6  CL 102 102  CO2 25  --   GLUCOSE 136* 119*  BUN 17 21*  CREATININE 1.58* 1.60*  CALCIUM 8.8*  --    Liver Function Tests:  Recent Labs  07/01/14 1010  AST 60*  ALT 52  ALKPHOS 76  BILITOT 2.5*  PROT 7.0  ALBUMIN 4.2    Recent Labs  07/01/14 1010  LIPASE 26   CBC:  Recent Labs  07/01/14 1010 07/01/14 1301  WBC 3.9*  --   NEUTROABS 3.2  --   HGB 14.7 15.0  HCT 42.9 44.0  MCV 82.2  --   PLT 65*  --    Cardiac Enzymes: Troponin POC 07/01/2014 0.00  Urinalysis:  Recent Labs  07/01/14 1057  COLORURINE ORANGE*  LABSPEC 1.028  PHURINE 5.0  GLUCOSEU NEGATIVE  HGBUR NEGATIVE  BILIRUBINUR SMALL*  KETONESUR 15*  PROTEINUR 30*  UROBILINOGEN 1.0  NITRITE NEGATIVE  LEUKOCYTESUR TRACE*    Imaging results:  Ct Abdomen Pelvis W Contrast  07/01/2014   CLINICAL DATA:  General body aches, pain and fever.  EXAM: CT ABDOMEN AND PELVIS WITH CONTRAST  TECHNIQUE: Multidetector CT imaging of the abdomen and pelvis was performed using the standard protocol following bolus administration of intravenous contrast.  CONTRAST:  OMNIPAQUE IOHEXOL 300 MG/ML  SOLN  COMPARISON:   06/26/2013  FINDINGS: Lower chest: The lung bases are clear. There is no pleural effusion.  Hepatobiliary: Small low-attenuation foci within the liver are noted. The largest measures 5 mm and is unchanged from previous exam, likely cyst. The spleen is normal. The gallbladder is normal. No biliary dilatation.  Pancreas: Negative  Spleen: Progressive splenomegaly. The spleen currently measures 16 cm in length. There is a subtle focus of low attenuation within the mid spleen, image 27/ series 201.  Adrenals/Urinary Tract: The adrenal glands are normal. Unremarkable appearance of the left kidney. There is a cyst within the right kidney measuring 1.1 cm, image 30/ series 201. The urinary bladder appears normal.  Stomach/Bowel: The stomach and the small bowel loops have a normal course and caliber. There is no evidence for bowel obstruction. Normal appearance of the colon.  Vascular/Lymphatic: Normal appearance of the abdominal aorta. No enlarged retroperitoneal or mesenteric adenopathy. No enlarged pelvic  or inguinal lymph nodes.  Reproductive: Prostate gland and seminal vesicles are normal.  Other: There is high attenuation fluid identified around the liver and spleen which is concerning for hemo peritoneum.  Musculoskeletal: No acute bone abnormalities noted.  IMPRESSION: 1. There is high attenuation fluid around the liver and spleen consistent with hemo peritoneum. 2. Subtle area of low attenuation within the enlarged left spleen which may indicate a small grade 1 laceration. This may explain the presence of hemo peritoneum. Alternatively, the patient is noted have very low platelets which could result in spontaneous hemorrhage. Further workup advised. 3. Critical Value/emergent results were called by telephone at the time of interpretation on 07/01/2014 at 12:00 pm to Dr. Pricilla Loveless , who verbally acknowledged these results.   Electronically Signed   By: Signa Kell M.D.   On: 07/01/2014 12:00    Assessment  & Plan by Problem: Active Problems:   Splenomegaly  Abdominal pain: Lipase 26. Total bilirubin 1.6 with 1.1 indirect bilirubin. UA with 15 ketones, trace leukocytes, negative nitrite, and rare bacteria. CT A/P w/ contrast 07/01/2014 with high attenuation fluid around the liver and spleen and subtle area of low attenuation within the enlarged left spleen that may indicate a small grade 1 laceration. INR 1.25 and PTT 29. General surgery consulted. Dr. Cyndie Chime (heme/onc) was consulted. Concern for lymphoma versus myeloproliferative disease. Other differential includes infection. -General surgery consulted, appreciate recommendations -CBC Q6hr -Heme/onc to see in AM -Peripheral smear -Reticulocytes -EKG pending  Leukopenia and thrombocytopenia: WBC 3.9 and plts 65 on admission. Previous WBC 5.4 and plts 133 06/26/2013. -Plan as above  AKI: Creatinine on admission 1.6. Previous Cr 1.29 06/26/2013. -NS@100ml /hr  Hyperlipidemia: continue home pravastatin  QHS  Depression: continue home sertraline  daily  BPH: continue home tamsulosin 0.4mg  QHS  FEN:  -Regular diet, NPO after midnight -NS@100ml /hr  VTE ppx: SCDs  Dispo: Disposition is deferred at this time, awaiting improvement of current medical problems. Anticipated discharge in approximately 1-2 day(s).   The patient does not have a current PCP (No primary care provider on file.) and does not need an [facility] hospital follow-up appointment after discharge.  The patient does not have transportation limitations that hinder transportation to clinic appointments.  Signed: Lora Paula, MD PGY-1 07/01/2014, 2:13 PM

## 2014-07-01 NOTE — Progress Notes (Signed)
NURSING PROGRESS NOTE  Kevin Santiago [id number] Admitted to [id number]: 07/01/2014 Attending Provider: Inez CatalinaEmily B Mullen, MD    Kevin Santiago is a 74 y.o. male patient admitted from ED awake, alert  & orientated  X 4,  Full Code, VSS - Blood pressure 132/75, pulse 64, temperature 97.8 F (36.6 C), temperature source Oral, resp. rate 16, height 5\' 10"  (1.778 m), weight 81.647 kg (180 lb), SpO2 98 %., no c/o shortness of breath, no c/o chest pain, no distress noted. nontele   IV site WDL:  antecubital right, condition patent and no redness with a transparent dsg that's clean dry and intact.  Allergies:   Allergies  Allergen Reactions  . Fish-Derived Products Rash    Fish sticks  . Penicillins Itching and Rash     Past Medical History  Diagnosis Date  . Depression   . Hypercholesteremia     History:  obtained from the patient.  Pt orientation to unit, room and routine. Information packet given to patient/family and safety video watched.  Admission INP armband ID verified with patient/family, and in place. SR up x 2, fall risk assessment complete with Patient and family verbalizing understanding of risks associated with falls. Pt verbalizes an understanding of how to use the call bell and to call for help before getting out of bed.  Skin, clean-dry- intact without evidence of bruising, or skin tears.   No evidence of skin break down noted on exam.    Will cont to monitor and assist as needed.  Julien NordmannJackson, Samik Balkcom Mounds[place], RN 07/01/2014

## 2014-07-01 NOTE — ED Notes (Signed)
The patient is unable to give a urine specimen at this time. 

## 2014-07-01 NOTE — ED Notes (Signed)
Admitting at bedside 

## 2014-07-02 DIAGNOSIS — N179 Acute kidney failure, unspecified: Secondary | ICD-10-CM

## 2014-07-02 DIAGNOSIS — R1012 Left upper quadrant pain: Secondary | ICD-10-CM

## 2014-07-02 DIAGNOSIS — Z981 Arthrodesis status: Secondary | ICD-10-CM

## 2014-07-02 DIAGNOSIS — F329 Major depressive disorder, single episode, unspecified: Secondary | ICD-10-CM

## 2014-07-02 DIAGNOSIS — N400 Enlarged prostate without lower urinary tract symptoms: Secondary | ICD-10-CM

## 2014-07-02 DIAGNOSIS — R7303 Prediabetes: Secondary | ICD-10-CM | POA: Diagnosis present

## 2014-07-02 DIAGNOSIS — D696 Thrombocytopenia, unspecified: Secondary | ICD-10-CM | POA: Diagnosis present

## 2014-07-02 DIAGNOSIS — K661 Hemoperitoneum: Secondary | ICD-10-CM | POA: Diagnosis present

## 2014-07-02 DIAGNOSIS — D61818 Other pancytopenia: Secondary | ICD-10-CM | POA: Diagnosis present

## 2014-07-02 DIAGNOSIS — E785 Hyperlipidemia, unspecified: Secondary | ICD-10-CM

## 2014-07-02 DIAGNOSIS — Z79899 Other long term (current) drug therapy: Secondary | ICD-10-CM

## 2014-07-02 DIAGNOSIS — R161 Splenomegaly, not elsewhere classified: Secondary | ICD-10-CM

## 2014-07-02 DIAGNOSIS — D72819 Decreased white blood cell count, unspecified: Secondary | ICD-10-CM

## 2014-07-02 LAB — MONONUCLEOSIS SCREEN: Mono Screen: NEGATIVE

## 2014-07-02 LAB — CBC
HEMATOCRIT: 36.6 % — AB (ref 39.0–52.0)
Hemoglobin: 12.6 g/dL — ABNORMAL LOW (ref 13.0–17.0)
MCH: 28.3 pg (ref 26.0–34.0)
MCHC: 34.4 g/dL (ref 30.0–36.0)
MCV: 82.1 fL (ref 78.0–100.0)
PLATELETS: 50 10*3/uL — AB (ref 150–400)
RBC: 4.46 MIL/uL (ref 4.22–5.81)
RDW: 14.1 % (ref 11.5–15.5)
WBC: 1.5 10*3/uL — AB (ref 4.0–10.5)

## 2014-07-02 LAB — COMPREHENSIVE METABOLIC PANEL
ALBUMIN: 3.5 g/dL (ref 3.5–5.0)
ALK PHOS: 59 U/L (ref 38–126)
ALT: 41 U/L (ref 17–63)
ANION GAP: 5 (ref 5–15)
AST: 38 U/L (ref 15–41)
BUN: 14 mg/dL (ref 6–20)
CALCIUM: 7.8 mg/dL — AB (ref 8.9–10.3)
CO2: 24 mmol/L (ref 22–32)
Chloride: 109 mmol/L (ref 101–111)
Creatinine, Ser: 1.13 mg/dL (ref 0.61–1.24)
GFR calc non Af Amer: >60 mL/min (ref >60)
GLUCOSE: 105 mg/dL — AB (ref 65–99)
Potassium: 4 mmol/L (ref 3.5–5.1)
Sodium: 138 mmol/L (ref 135–145)
TOTAL PROTEIN: 5.5 g/dL — AB (ref 6.5–8.1)
Total Bilirubin: 1.8 mg/dL — ABNORMAL HIGH (ref 0.3–1.2)

## 2014-07-02 LAB — PROTIME-INR
INR: 1.16 (ref 0.00–1.49)
Prothrombin Time: 15 seconds (ref 11.6–15.2)

## 2014-07-02 LAB — APTT: aPTT: 31 seconds (ref 24–37)

## 2014-07-02 LAB — TECHNOLOGIST SMEAR REVIEW

## 2014-07-02 NOTE — Progress Notes (Signed)
  Date: 07/02/2014  Patient name: Kevin Santiago  Medical record number: [id number]  Date of birth: 1940/08/06   I have seen and evaluated Kevin Santiago and discussed their care with the Residency Team.  Please see Dr. Joaquin MusicKrall's note from 07/01/14.   Briefly, Kevin Santiago is a 74yo man with PMH of HLD and depression who presented with LUQ abdominal pain.  He reports that over the wedkend he had myalgias and subjective fevers and he thought he had the flu or was dehydrated.  This resolved after taking a shower.  He then, on morning of admission, developed LUQ pain which was sharp and constant, worse with deep breathing, no relieving factors noted.  He denies trauma or any other symptoms.  He has had skin cancers which had to be removed from his arms.   On exam, he is an elderly man, NAD, alert, oriented, RR, NR, no murmur, CTAB, pain with deep breathing, + splenomegaly, TTP over LUQ, palpation in other quadrants causes LUQ pain.  Legs are thin.  He has darkened skin over forearms and some rough skin lesions.    Labs showed a Cr of 1.58, AST of 60, WBC of 3.9, Plt of 65.  CT of the abdomen showed likely small splenic laceration.    Assessment and Plan: I have seen and evaluated the patient as outlined above. I agree with the formulated Assessment and Plan as detailed in the residents' admission note, with the following changes:   1. LUQ abdominal pain, splenomegaly - DDx includes infection, lymphoma, MP disease.  - Hematology/Oncology consulted - Plan for BM biopsy - Trend CBC for given hemoperitoneum - Serial abdominal exams.  - Peripheral smear - Retics - Gen surgery consulted for possible splenectomy.   2. Leukopenia, thrombocytopenia - Plan as above, if related to an acute hematologic issue, work up started  Other issues per resident note.   Inez CatalinaEmily B Koichi Platte, MD 5/31/20162:41 PM

## 2014-07-02 NOTE — Progress Notes (Signed)
Subjective: No acute events overnight. He reports he is still having some abdominal pain. Denies nausea or vomiting. No shortness of breath or chest pain.  Objective: Vital signs in last 24 hours: Filed Vitals:   07/01/14 1430 07/01/14 1533 07/01/14 2050 07/02/14 0549  BP: 129/78 132/75 140/66 135/72  Pulse: 54 64 61 63  Temp:  97.8 F (36.6 C) 98.4 F (36.9 C) 98.2 F (36.8 C)  TempSrc:  Oral Oral Oral  Resp: $Remo'21 16 16 15  'ZlRoV$ Height:      Weight:      SpO2: 98% 98% 97% 96%   Weight change:   Intake/Output Summary (Last 24 hours) at 07/02/14 1055 Last data filed at 07/02/14 0700  Gross per 24 hour  Intake    465 ml  Output    900 ml  Net   -435 ml   General Apperance: NAD HEENT: Normocephalic, atraumatic, PERRL, EOMI, anicteric sclera Neck: Supple, trachea midline Lungs: Clear to auscultation bilaterally. No wheezes, rhonchi or rales. Breathing comfortably Heart: Regular rate and rhythm, no murmur/rub/gallop Abdomen: Soft, LUQ abdominal pain, nondistended, no rebound/guarding, + splenomegaly Extremities: Normal, atraumatic, warm and well perfused, no edema Pulses: 2+ throughout Skin: No rashes or lesions Neurologic: Alert and oriented x 3. CNII-XII intact. Normal strength and sensation  Lab Results: Basic Metabolic Panel:  Recent Labs Lab 07/01/14 1010 07/01/14 1301 07/02/14 0441  NA 137 137 138  K 3.9 3.6 4.0  CL 102 102 109  CO2 25  --  24  GLUCOSE 136* 119* 105*  BUN 17 21* 14  CREATININE 1.58* 1.60* 1.13  CALCIUM 8.8*  --  7.8*   Liver Function Tests:  Recent Labs Lab 07/01/14 1010 07/01/14 1715 07/02/14 0441  AST 60*  --  38  ALT 52  --  41  ALKPHOS 76  --  59  BILITOT 2.5* 1.6* 1.8*  PROT 7.0  --  5.5*  ALBUMIN 4.2  --  3.5    Recent Labs Lab 07/01/14 1010  LIPASE 26   CBC:  Recent Labs Lab 07/01/14 1010 07/01/14 1301 07/02/14 0441  WBC 3.9*  --  1.5*  NEUTROABS 3.2  --   --   HGB 14.7 15.0 12.6*  HCT 42.9 44.0 36.6*  MCV  82.2  --  82.1  PLT 65*  --  50*   Coagulation:  Recent Labs Lab 07/01/14 1445 07/02/14 0441  LABPROT 15.9* 15.0  INR 1.25 1.16   Anemia Panel:  Recent Labs Lab 07/01/14 1715  RETICCTPCT 1.0   Urinalysis:  Recent Labs Lab 07/01/14 1057  COLORURINE ORANGE*  LABSPEC 1.028  PHURINE 5.0  GLUCOSEU NEGATIVE  HGBUR NEGATIVE  BILIRUBINUR SMALL*  KETONESUR 15*  PROTEINUR 30*  UROBILINOGEN 1.0  NITRITE NEGATIVE  LEUKOCYTESUR TRACE*    Micro Results: No results found for this or any previous visit (from the past 240 hour(s)). Studies/Results: Ct Abdomen Pelvis W Contrast  07/01/2014   CLINICAL DATA:  General body aches, pain and fever.  EXAM: CT ABDOMEN AND PELVIS WITH CONTRAST  TECHNIQUE: Multidetector CT imaging of the abdomen and pelvis was performed using the standard protocol following bolus administration of intravenous contrast.  CONTRAST:  153mL OMNIPAQUE IOHEXOL 300 MG/ML  SOLN  COMPARISON:  06/26/2013  FINDINGS: Lower chest: The lung bases are clear. There is no pleural effusion.  Hepatobiliary: Small low-attenuation foci within the liver are noted. The largest measures 5 mm and is unchanged from previous exam, likely cyst. The spleen is normal. The  gallbladder is normal. No biliary dilatation.  Pancreas: Negative  Spleen: Progressive splenomegaly. The spleen currently measures 16 cm in length. There is a subtle focus of low attenuation within the mid spleen, image 27/ series 201.  Adrenals/Urinary Tract: The adrenal glands are normal. Unremarkable appearance of the left kidney. There is a cyst within the right kidney measuring 1.1 cm, image 30/ series 201. The urinary bladder appears normal.  Stomach/Bowel: The stomach and the small bowel loops have a normal course and caliber. There is no evidence for bowel obstruction. Normal appearance of the colon.  Vascular/Lymphatic: Normal appearance of the abdominal aorta. No enlarged retroperitoneal or mesenteric adenopathy. No  enlarged pelvic or inguinal lymph nodes.  Reproductive: Prostate gland and seminal vesicles are normal.  Other: There is high attenuation fluid identified around the liver and spleen which is concerning for hemo peritoneum.  Musculoskeletal: No acute bone abnormalities noted.  IMPRESSION: 1. There is high attenuation fluid around the liver and spleen consistent with hemo peritoneum. 2. Subtle area of low attenuation within the enlarged left spleen which may indicate a small grade 1 laceration. This may explain the presence of hemo peritoneum. Alternatively, the patient is noted have very low platelets which could result in spontaneous hemorrhage. Further workup advised. 3. Critical Value/emergent results were called by telephone at the time of interpretation on 07/01/2014 at 12:00 pm to Dr. Sherwood Gambler , who verbally acknowledged these results.   Electronically Signed   By: Kerby Moors M.D.   On: 07/01/2014 12:00   Medications: I have reviewed the patient's current medications. Scheduled Meds: . pravastatin  20 mg Oral QHS  . sertraline  25 mg Oral QHS  . tamsulosin  0.4 mg Oral QHS   Continuous Infusions:  PRN Meds:.acetaminophen **OR** acetaminophen Assessment/Plan: Active Problems:   Splenomegaly   Spleen injury   Other pancytopenia   Hemoperitoneum   Thrombocytopenia Splenomegaly: No immature cells on review of blood smear per Dr. Beryle Beams. Monospot negative. Reticulocytes unremarkable -General surgery consulted, appreciate recommendations -Heme/onc consultation, appreciate recommendations -IR bone biopsy on 07/04/2014 -If bone marrow biopsy nondiagnostic will need diagnostic/therapeutic splenectomy -HIV antibody pending -Obtain CMV, EBV, acute hepatitis panel, LDH, uric acid  Pancytopenia: WBC 3.9 and plts 65 on admission. Previous WBC 5.4 and plts 133 06/26/2013. WBC 1.5, hemoglobin 12.6, platelets 50 today. -Plan as above  AKI: Creatinine on admission 1.6. Previous Cr 1.29  06/26/2013. Resolved with creatinine 1.13 today  Hyperlipidemia: continue home pravastatin $RemoveBeforeDE'20mg'[id number]$  QHS  Depression: continue home sertraline $RemoveBeforeD'25mg'gzqjiekivnfVpp$  daily  BPH: continue home tamsulosin 0.$RemoveBeforeDEI'4mg'eDKpuKSBbXbRsXcf$  QHS  FEN:  -Regular diet, NPO after midnight  VTE ppx: SCDs  Dispo: Disposition is deferred at this time, awaiting improvement of current medical problems.  Anticipated discharge in approximately 2-3 day(s).   The patient does not have a current PCP (No primary care provider on file.) and does not need an [facility] hospital follow-up appointment after discharge.  The patient does not have transportation limitations that hinder transportation to clinic appointments.  .Services Needed at time of discharge: Y = Yes, Blank = No PT:   OT:   RN:   Equipment:   Other:     LOS: 1 day   Milagros Loll, MD 07/02/2014, 10:55 AM

## 2014-07-02 NOTE — Progress Notes (Signed)
Utilization Review completed. Dat Derksen RN BSN CM 
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
## 2014-07-02 NOTE — Consult Note (Signed)
Chief Complaint: Chief Complaint  Patient presents with  . Abdominal Pain  splenomegaly pancytopenia  Referring Physician(s): Dr Cyndie Chime  History of Present Illness: Kevin Santiago is a 74 y.o. male   Pt admitted with "full feeling in abd" Unable to eat much Weakness Work up reveals pancytopenia; splenomegaly Dr Cyndie Chime requesting Bone Marrow bx Scheduled for 8am 6/2 in Radiology   Past Medical History  Diagnosis Date  . Depression   . Hypercholesteremia     Past Surgical History  Procedure Laterality Date  . Cervical fusion    . Wrist fracture surgery    . Shoulder surgery Left 1990    Allergies: Fish-derived products and Penicillins  Medications: Prior to Admission medications   Medication Sig Start Date End Date Taking? Authorizing Provider  aspirin EC 81 MG tablet Take 81 mg by mouth daily.   Yes Historical Provider, MD  Calcium-Magnesium-Vitamin D (CALCIUM 500 PO) Take 1 tablet by mouth daily.   Yes Historical Provider, MD  cholecalciferol (VITAMIN D) 1000 UNITS tablet Take 1,000 Units by mouth at bedtime.   Yes Historical Provider, MD  hydrocortisone (ANUSOL-HC) 2.5 % rectal cream Place 1 application rectally 2 (two) times daily. Patient taking differently: Place 1 application rectally 2 (two) times daily as needed for itching.  06/30/13  Yes Riki Sheer, PA-C  hydroxypropyl methylcellulose (ISOPTO TEARS) 2.5 % ophthalmic solution Place 2 drops into both eyes 2 (two) times daily as needed for dry eyes.   Yes Historical Provider, MD  omeprazole (PRILOSEC) 40 MG capsule Take 40 mg by mouth daily as needed (ACID REFLUX).   Yes Historical Provider, MD  ondansetron (ZOFRAN ODT) 4 MG disintegrating tablet Take 1 tablet (4 mg total) by mouth every 6 (six) hours as needed for nausea or vomiting. 06/26/13  Yes Pricilla Loveless, MD  pravastatin (PRAVACHOL) 20 MG tablet Take 20 mg by mouth at bedtime.   Yes Historical Provider, MD  PRESCRIPTION MEDICATION Pt  gets infusion once a year for osteoporosis  (RECLAST?)  at the [facility] in [place] - Last dose was Feb 2016   Yes Historical Provider, MD  sertraline (ZOLOFT) 25 MG tablet Take 25 mg by mouth at bedtime.   Yes Historical Provider, MD  tamsulosin (FLOMAX) 0.4 MG CAPS capsule Take 0.4 mg by mouth at bedtime.   Yes Historical Provider, MD     History reviewed. No pertinent family history.  History   Social History  . Marital Status: Single    Spouse Name: N/A  . Number of Children: N/A  . Years of Education: N/A   Social History Main Topics  . Smoking status: Never Smoker   . Smokeless tobacco: Not on file  . Alcohol Use: 14.4 oz/week    24 Cans of beer per week     Comment: ocassional case a month  . Drug Use: No  . Sexual Activity: Yes    Birth Control/ Protection: None   Other Topics Concern  . None   Social History Narrative    Review of Systems: A 12 point ROS discussed and pertinent positives are indicated in the HPI above.  All other systems are negative.  Review of Systems  Constitutional: Positive for activity change, appetite change and fatigue. Negative for fever.  Respiratory: Positive for shortness of breath.   Gastrointestinal: Positive for abdominal pain and abdominal distention.  Neurological: Negative for weakness.  Psychiatric/Behavioral: Negative for behavioral problems and confusion.    Vital Signs: BP 135/72 mmHg  Pulse  63  Temp(Src) 98.2 F (36.8 C) (Oral)  Resp 15  Ht 5\' 10"  (1.778 m)  Wt 81.647 kg (180 lb)  BMI 25.83 kg/m2  SpO2 96%  Physical Exam  Constitutional: He is oriented to person, place, and time. He appears well-nourished.  Cardiovascular: Normal rate, regular rhythm and normal heart sounds.   No murmur heard. Pulmonary/Chest: Effort normal and breath sounds normal. He has no wheezes.  Abdominal: Soft. Bowel sounds are normal. He exhibits distension.  Musculoskeletal: Normal range of motion.  Neurological: He is alert and oriented  to person, place, and time.  Skin: Skin is warm and dry.  Psychiatric: He has a normal mood and affect. His behavior is normal. Judgment and thought content normal.  Nursing note and vitals reviewed.   Mallampati Score:  MD Evaluation Airway: WNL Heart: WNL Abdomen: WNL Chest/ Lungs: WNL ASA  Classification: 2 Mallampati/Airway Score: Two  Imaging: Ct Abdomen Pelvis W Contrast  07/01/2014   CLINICAL DATA:  General body aches, pain and fever.  EXAM: CT ABDOMEN AND PELVIS WITH CONTRAST  TECHNIQUE: Multidetector CT imaging of the abdomen and pelvis was performed using the standard protocol following bolus administration of intravenous contrast.  CONTRAST:  100mL OMNIPAQUE IOHEXOL 300 MG/ML  SOLN  COMPARISON:  06/26/2013  FINDINGS: Lower chest: The lung bases are clear. There is no pleural effusion.  Hepatobiliary: Small low-attenuation foci within the liver are noted. The largest measures 5 mm and is unchanged from previous exam, likely cyst. The spleen is normal. The gallbladder is normal. No biliary dilatation.  Pancreas: Negative  Spleen: Progressive splenomegaly. The spleen currently measures 16 cm in length. There is a subtle focus of low attenuation within the mid spleen, image 27/ series 201.  Adrenals/Urinary Tract: The adrenal glands are normal. Unremarkable appearance of the left kidney. There is a cyst within the right kidney measuring 1.1 cm, image 30/ series 201. The urinary bladder appears normal.  Stomach/Bowel: The stomach and the small bowel loops have a normal course and caliber. There is no evidence for bowel obstruction. Normal appearance of the colon.  Vascular/Lymphatic: Normal appearance of the abdominal aorta. No enlarged retroperitoneal or mesenteric adenopathy. No enlarged pelvic or inguinal lymph nodes.  Reproductive: Prostate gland and seminal vesicles are normal.  Other: There is high attenuation fluid identified around the liver and spleen which is concerning for hemo  peritoneum.  Musculoskeletal: No acute bone abnormalities noted.  IMPRESSION: 1. There is high attenuation fluid around the liver and spleen consistent with hemo peritoneum. 2. Subtle area of low attenuation within the enlarged left spleen which may indicate a small grade 1 laceration. This may explain the presence of hemo peritoneum. Alternatively, the patient is noted have very low platelets which could result in spontaneous hemorrhage. Further workup advised. 3. Critical Value/emergent results were called by telephone at the time of interpretation on 07/01/2014 at 12:00 pm to Dr. Pricilla LovelessSCOTT GOLDSTON , who verbally acknowledged these results.   Electronically Signed   By: Signa Kellaylor  Stroud M.D.   On: 07/01/2014 12:00    Labs:  CBC:  Recent Labs  07/01/14 1010 07/01/14 1301 07/02/14 0441  WBC 3.9*  --  1.5*  HGB 14.7 15.0 12.6*  HCT 42.9 44.0 36.6*  PLT 65*  --  50*    COAGS:  Recent Labs  07/01/14 1445 07/01/14 1715 07/02/14 0441  INR 1.25  --  1.16  APTT  --  29 31    BMP:  Recent Labs  07/01/14 1010  07/01/14 1301 07/02/14 0441  NA 137 137 138  K 3.9 3.6 4.0  CL 102 102 109  CO2 25  --  24  GLUCOSE 136* 119* 105*  BUN 17 21* 14  CALCIUM 8.8*  --  7.8*  CREATININE 1.58* 1.60* 1.13  GFRNONAA 42*  --  >60  GFRAA 49*  --  >60    LIVER FUNCTION TESTS:  Recent Labs  07/01/14 1010 07/01/14 1715 07/02/14 0441  BILITOT 2.5* 1.6* 1.8*  AST 60*  --  38  ALT 52  --  41  ALKPHOS 76  --  59  PROT 7.0  --  5.5*  ALBUMIN 4.2  --  3.5    TUMOR MARKERS: No results for input(s): AFPTM, CEA, CA199, CHROMGRNA in the last 8760 hours.  Assessment and Plan:  Splenomegaly Pancytopenia Scheduled for BM bx 6/2 (secondary CT and cyto schedule) Risks and Benefits discussed with the patient including, but not limited to bleeding, infection, damage to adjacent structures or low yield requiring additional tests. All of the patient's questions were answered, patient is agreeable to  proceed. Consent signed and in chart.   Thank you for this interesting consult.  I greatly enjoyed meeting DEVERON SHAMOON and look forward to participating in their care.  Signed: Mandisa Persinger A 07/02/2014, 11:35 AM   I spent a total of 20 minutes  in face to face in clinical consultation, greater than 50% of which was counseling/coordinating care for BM bx

## 2014-07-02 NOTE — Progress Notes (Signed)
Patient ID: Kevin Santiago, male   DOB: [birth year], 74 y.o.   MRN: [id number]    Subjective: Pt still with some abdominal pain, but has not taken pain medicine since last night.  Objective: Vital signs in last 24 hours: Temp:  [97.8 F (36.6 C)-98.4 F (36.9 C)] 98.2 F (36.8 C) (05/31 0549) Pulse Rate:  [54-99] 63 (05/31 0549) Resp:  [15-25] 15 (05/31 0549) BP: (107-140)/(59-85) 135/72 mmHg (05/31 0549) SpO2:  [92 %-98 %] 96 % (05/31 0549) Last BM Date: 06/29/14  Intake/Output from previous day: 05/30 0701 - 05/31 0700 In: 465 [P.O.:240; I.V.:225] Out: 900 [Urine:900] Intake/Output this shift:    PE: Abd: soft, mild tenderness, especially in LUQ  Lab Results:   Recent Labs  07/01/14 1010 07/01/14 1301 07/02/14 0441  WBC 3.9*  --  1.5*  HGB 14.7 15.0 12.6*  HCT 42.9 44.0 36.6*  PLT 65*  --  50*   BMET  Recent Labs  07/01/14 1010 07/01/14 1301 07/02/14 0441  NA 137 137 138  K 3.9 3.6 4.0  CL 102 102 109  CO2 25  --  24  GLUCOSE 136* 119* 105*  BUN 17 21* 14  CREATININE 1.58* 1.60* 1.13  CALCIUM 8.8*  --  7.8*   PT/INR  Recent Labs  07/01/14 1445 07/02/14 0441  LABPROT 15.9* 15.0  INR 1.25 1.16   CMP     Component Value Date/Time   NA 138 07/02/2014 0441   K 4.0 07/02/2014 0441   CL 109 07/02/2014 0441   CO2 24 07/02/2014 0441   GLUCOSE 105* 07/02/2014 0441   BUN 14 07/02/2014 0441   CREATININE 1.13 07/02/2014 0441   CALCIUM 7.8* 07/02/2014 0441   PROT 5.5* 07/02/2014 0441   ALBUMIN 3.5 07/02/2014 0441   AST 38 07/02/2014 0441   ALT 41 07/02/2014 0441   ALKPHOS 59 07/02/2014 0441   BILITOT 1.8* 07/02/2014 0441   GFRNONAA >60 07/02/2014 0441   GFRAA >60 07/02/2014 0441   Lipase     Component Value Date/Time   LIPASE 26 07/01/2014 1010       Studies/Results: Ct Abdomen Pelvis W Contrast  07/01/2014   CLINICAL DATA:  General body aches, pain and fever.  EXAM: CT ABDOMEN AND PELVIS WITH CONTRAST  TECHNIQUE: Multidetector CT  imaging of the abdomen and pelvis was performed using the standard protocol following bolus administration of intravenous contrast.  CONTRAST:  OMNIPAQUE IOHEXOL 300 MG/ML  SOLN  COMPARISON:  06/26/2013  FINDINGS: Lower chest: The lung bases are clear. There is no pleural effusion.  Hepatobiliary: Small low-attenuation foci within the liver are noted. The largest measures 5 mm and is unchanged from previous exam, likely cyst. The spleen is normal. The gallbladder is normal. No biliary dilatation.  Pancreas: Negative  Spleen: Progressive splenomegaly. The spleen currently measures 16 cm in length. There is a subtle focus of low attenuation within the mid spleen, image 27/ series 201.  Adrenals/Urinary Tract: The adrenal glands are normal. Unremarkable appearance of the left kidney. There is a cyst within the right kidney measuring 1.1 cm, image 30/ series 201. The urinary bladder appears normal.  Stomach/Bowel: The stomach and the small bowel loops have a normal course and caliber. There is no evidence for bowel obstruction. Normal appearance of the colon.  Vascular/Lymphatic: Normal appearance of the abdominal aorta. No enlarged retroperitoneal or mesenteric adenopathy. No enlarged pelvic or inguinal lymph nodes.  Reproductive: Prostate gland and seminal vesicles are normal.  Other: There  is high attenuation fluid identified around the liver and spleen which is concerning for hemo peritoneum.  Musculoskeletal: No acute bone abnormalities noted.  IMPRESSION: 1. There is high attenuation fluid around the liver and spleen consistent with hemo peritoneum. 2. Subtle area of low attenuation within the enlarged left spleen which may indicate a small grade 1 laceration. This may explain the presence of hemo peritoneum. Alternatively, the patient is noted have very low platelets which could result in spontaneous hemorrhage. Further workup advised. 3. Critical Value/emergent results were called by telephone at the time  of interpretation on 07/01/2014 at 12:00 pm to Dr. Pricilla LovelessSCOTT GOLDSTON , who verbally acknowledged these results.   Electronically Signed   By: Signa Kellaylor  Stroud M.D.   On: 07/01/2014 12:00    Anti-infectives: Anti-infectives    None       Assessment/Plan  1. Splenomegaly -Dr. Cyndie ChimeGranfortuna has requested a bone marrow bx, pending results, patient may require a splenectomy.   -will follow and chart check results as there is nothing acutely surgical to do.   LOS: 1 day    Koralee Wedeking E 07/02/2014, 10:05 AM Pager: [phone]

## 2014-07-03 DIAGNOSIS — K661 Hemoperitoneum: Secondary | ICD-10-CM

## 2014-07-03 LAB — CBC WITH DIFFERENTIAL/PLATELET
BASOS ABS: 0 10*3/uL (ref 0.0–0.1)
Basophils Relative: 1 % (ref 0–1)
Eosinophils Absolute: 0 10*3/uL (ref 0.0–0.7)
Eosinophils Relative: 0 % (ref 0–5)
HCT: 35.6 % — ABNORMAL LOW (ref 39.0–52.0)
HEMOGLOBIN: 12.3 g/dL — AB (ref 13.0–17.0)
Lymphocytes Relative: 37 % (ref 12–46)
Lymphs Abs: 0.4 10*3/uL — ABNORMAL LOW (ref 0.7–4.0)
MCH: 28.1 pg (ref 26.0–34.0)
MCHC: 34.6 g/dL (ref 30.0–36.0)
MCV: 81.5 fL (ref 78.0–100.0)
Monocytes Absolute: 0.2 10*3/uL (ref 0.1–1.0)
Monocytes Relative: 15 % — ABNORMAL HIGH (ref 3–12)
NEUTROS PCT: 47 % (ref 43–77)
Neutro Abs: 0.5 10*3/uL — ABNORMAL LOW (ref 1.7–7.7)
Platelets: 55 10*3/uL — ABNORMAL LOW (ref 150–400)
RBC: 4.37 MIL/uL (ref 4.22–5.81)
RDW: 14.1 % (ref 11.5–15.5)
Smear Review: DECREASED
WBC: 1.1 10*3/uL — CL (ref 4.0–10.5)

## 2014-07-03 LAB — BASIC METABOLIC PANEL
Anion gap: 8 (ref 5–15)
BUN: 11 mg/dL (ref 6–20)
CALCIUM: 8 mg/dL — AB (ref 8.9–10.3)
CO2: 25 mmol/L (ref 22–32)
CREATININE: 1.19 mg/dL (ref 0.61–1.24)
Chloride: 105 mmol/L (ref 101–111)
GFR calc Af Amer: >60 mL/min (ref >60)
GFR calc non Af Amer: 60 mL/min — ABNORMAL LOW (ref >60)
GLUCOSE: 110 mg/dL — AB (ref 65–99)
Potassium: 3.8 mmol/L (ref 3.5–5.1)
SODIUM: 138 mmol/L (ref 135–145)

## 2014-07-03 LAB — PATHOLOGIST SMEAR REVIEW

## 2014-07-03 LAB — PHOSPHORUS: PHOSPHORUS: 2.3 mg/dL — AB (ref 2.5–4.6)

## 2014-07-03 LAB — HIV ANTIBODY (ROUTINE TESTING W REFLEX): HIV Screen 4th Generation wRfx: NONREACTIVE

## 2014-07-03 LAB — MAGNESIUM: Magnesium: 2.2 mg/dL (ref 1.7–2.4)

## 2014-07-03 LAB — LACTATE DEHYDROGENASE: LDH: 272 U/L — AB (ref 98–192)

## 2014-07-03 LAB — URIC ACID: URIC ACID, SERUM: 3.9 mg/dL — AB (ref 4.4–7.6)

## 2014-07-03 MED ORDER — ONDANSETRON HCL 4 MG PO TABS
4.0000 mg | ORAL_TABLET | Freq: Once | ORAL | Status: AC
  Administered 2014-07-03: 4 mg via ORAL
  Filled 2014-07-03: qty 1

## 2014-07-03 MED ORDER — BISACODYL 5 MG PO TBEC
5.0000 mg | DELAYED_RELEASE_TABLET | Freq: Every day | ORAL | Status: DC | PRN
  Administered 2014-07-03: 5 mg via ORAL
  Filled 2014-07-03: qty 1

## 2014-07-03 NOTE — Progress Notes (Signed)
Subjective:  Complaining of headache over the last 24 hours that resolved with Tylenol and Zofran. No reported focal neurological deficits. Appetite has improved. Patient not reporting any other issues.  Objective: Vital signs in last 24 hours: Filed Vitals:   07/02/14 1421 07/02/14 2140 07/03/14 0520 07/03/14 1406  BP: 128/66 114/53 125/74 128/71  Pulse: 74 66 60 61  Temp: 98.7 F (37.1 C) 99.7 F (37.6 C) 99 F (37.2 C) 98.3 F (36.8 C)  TempSrc: Oral Oral Oral Oral  Resp: _0 Height:      Weight:      SpO2: 97% 97% 98% 96%   Weight change:   Intake/Output Summary (Last 24 hours) at 07/03/14 1628 Last data filed at 07/03/14 1343  Gross per 24 hour  Intake    360 ml  Output   1150 ml  Net   -790 ml   General Apperance: NAD HEENT: Normocephalic, atraumatic, PERRL, EOMI, anicteric sclera Neck: Supple, trachea midline Lungs: Clear to auscultation bilaterally. No wheezes, rhonchi or rales. Breathing comfortably Heart: Regular rate and rhythm, no murmur/rub/gallop Abdomen: Soft, LUQ abdominal pain, nondistended, no rebound/guarding, + splenomegaly Extremities: Normal, atraumatic, warm and well perfused, no edema Pulses: 2+ throughout Skin: No rashes or lesions Neurologic: Alert and oriented x 3. CNII-XII intact. Normal strength and sensation  Lab Results: Basic Metabolic Panel:  Recent Labs Lab 07/02/14 0441 07/03/14 0708  NA 138 138  K 4.0 3.8  CL 109 105  CO2 24 25  GLUCOSE 105* 110*  BUN 14 11  CREATININE 1.13 1.19  CALCIUM 7.8* 8.0*  MG  --  2.2  PHOS  --  2.3*   Liver Function Tests:  Recent Labs Lab 07/01/14 1010 07/01/14 1715 07/02/14 0441  AST 60*  --  38  ALT 52  --  41  ALKPHOS 76  --  59  BILITOT 2.5* 1.6* 1.8*  PROT 7.0  --  5.5*  ALBUMIN 4.2  --  3.5    Recent Labs Lab 07/01/14 1010  LIPASE 26   CBC:  Recent Labs Lab 07/01/14 1010  07/02/14 0441 07/03/14 0708  WBC 3.9*  --  1.5* 1.1*  NEUTROABS 3.2  --   --   0.5*  HGB 14.7  < > 12.6* 12.3*  HCT 42.9  < > 36.6* 35.6*  MCV 82.2  --  82.1 81.5  PLT 65*  --  50* 55*  < > = values in this interval not displayed. Coagulation:  Recent Labs Lab 07/01/14 1445 07/02/14 0441  LABPROT 15.9* 15.0  INR 1.25 1.16   Anemia Panel:  Recent Labs Lab 07/01/14 1715  RETICCTPCT 1.0   Urinalysis:  Recent Labs Lab 07/01/14 1057  COLORURINE ORANGE*  LABSPEC 1.028  PHURINE 5.0  GLUCOSEU NEGATIVE  HGBUR NEGATIVE  BILIRUBINUR SMALL*  KETONESUR 15*  PROTEINUR 30*  UROBILINOGEN 1.0  NITRITE NEGATIVE  LEUKOCYTESUR TRACE*    Micro Results: No results found for this or any previous visit (from the past 240 hour(s)). Studies/Results: No results found. Medications: I have reviewed the patient's current medications. Scheduled Meds: . pravastatin  20 mg Oral QHS  . sertraline  25 mg Oral QHS  . tamsulosin  0.4 mg Oral QHS   Continuous Infusions:  PRN Meds:.acetaminophen **OR** acetaminophen, bisacodyl Assessment/Plan: Principal Problem:   Splenomegaly Active Problems:   Spleen injury   Other pancytopenia   Hemoperitoneum   Thrombocytopenia   Hypocalcemia   Hyperglycemia   AKI (acute kidney injury)  Splenomegaly with mild intra-abdominal bleed: Differential overall include malignancy versus infectious causes. No immature cells on review of blood smear per Dr. Beryle Beams. Monospot negative. Reticulocytes unremarkable -General surgery consulted, appreciate recommendations -Heme/onc consultation, appreciate recommendations -IR bone biopsy on 07/04/2014 -If bone marrow biopsy nondiagnostic will need diagnostic/therapeutic splenectomy -HIV and Monospot negative -CMV, EBV, vitamin D, mycoplasma, hepatitis panel pending  Pancytopenia: Continued downtrend of white count to 1.1 from 3.9 on admission (borderline neutropenic at 500). Platelets stable at 55. Hemoglobin stable at 12.3. No additional signs of bleeding. High LDH, uric acid, and  indirect bilirubin suggestive of hemolysis. -Plan as above -CBC with differential -Neutropenic precautions  AKI: Resolved. Creatinine stable at 1.19.  Hyperlipidemia: continue home pravastatin 12m QHS  Depression: continue home sertraline 296mdaily  BPH: continue home tamsulosin 0.36m38mHS  FEN:  -Regular diet, NPO after midnight  VTE ppx: SCDs  Dispo: Disposition is deferred at this time, awaiting improvement of current medical problems.  Anticipated discharge in approximately 2-3 day(s).   The patient does not have a current PCP (No primary care provider on file.) and [facility] not [facility] after [facility].  The patient does not have transportation limitations that hinder transportation to clinic appointments.  .Services Needed at time of discharge: Y = Yes, Blank = No PT:   OT:   RN:   Equipment:   Other:     LOS: 2 days   LawLuan MooreD 07/03/2014, 4:28 PM

## 2014-07-03 NOTE — Progress Notes (Signed)
Subjective:    Patient complained of headache with associated nausea overnight, resolved with Tylenol and Zofran. Denies any weakness, numbness/tingling, or vision changes. Appetite has improved. No other issues.    Objective:    Vital Signs:   Temp:  [98.7 F (37.1 C)-99.7 F (37.6 C)] 99 F (37.2 C) (06/01 0520) Pulse Rate:  [60-74] 60 (06/01 0520) Resp:  [16-18] 18 (06/01 0520) BP: (114-128)/(53-74) 125/74 mmHg (06/01 0520) SpO2:  [97 %-98 %] 98 % (06/01 0520) Last BM Date: 06/29/14  Intake/Output:   Intake/Output Summary (Last 24 hours) at 07/03/14 1347 Last data filed at 07/03/14 1343  Gross per 24 hour  Intake    600 ml  Output   1150 ml  Net   -550 ml      Physical Exam: General: Appears well-developed, well-nourished, in no acute distress; alert, appropriate and cooperative throughout examination.  Lungs:  Normal respiratory effort. Clear to auscultation BL without crackles or wheezes.  Heart: RRR. S1 and S2 normal without no murmurs appreciated.   Abdomen:  BS normoactive. Soft, non-distended, minimal tenderness in periumbilical area and LUQ.  LUQ fullness present.   Extremities: No pretibial edema. 2+ dorsalis pedis and radial pulses.      Labs: Labs and studies reviewed.   Basic Metabolic Panel:  Recent Labs Lab 07/01/14 1010 07/01/14 1301 07/02/14 0441 07/03/14 0708  NA 137 137 138 138  K 3.9 3.6 4.0 3.8  CL 102 102 109 105  CO2 25  --  24 25  GLUCOSE 136* 119* 105* 110*  BUN 17 21* 14 11  CREATININE 1.58* 1.60* 1.13 1.19  CALCIUM 8.8*  --  7.8* 8.0*  MG  --   --   --  2.2  PHOS  --   --   --  2.3*    Liver Function Tests:  Recent Labs Lab 07/01/14 1010 07/01/14 1715 07/02/14 0441  AST 60*  --  38  ALT 52  --  41  ALKPHOS 76  --  59  BILITOT 2.5* 1.6* 1.8*  PROT 7.0  --  5.5*  ALBUMIN 4.2  --  3.5    Recent Labs Lab 07/01/14 1010  LIPASE 26    CBC:  Recent Labs Lab 07/01/14 1010 07/01/14 1301 07/02/14 0441  07/03/14 0708  WBC 3.9*  --  1.5* 1.1*  NEUTROABS 3.2  --   --  0.5*  HGB 14.7 15.0 12.6* 12.3*  HCT 42.9 44.0 36.6* 35.6*  MCV 82.2  --  82.1 81.5  PLT 65*  --  50* 55*    Coagulation Studies:  Recent Labs  07/01/14 1445 07/02/14 0441  LABPROT 15.9* 15.0  INR 1.25 1.16     No new imaging.     Medications:    Infusions: None    Scheduled Medications: . pravastatin  20 mg Oral QHS  . sertraline  25 mg Oral QHS  . tamsulosin  0.4 mg Oral QHS    PRN Medications: acetaminophen **OR** acetaminophen, bisacodyl   Assessment/ Plan:    Kevin Santiago is a 74 year old man on hospital day #2 who presented with splenomegaly, leukopenia, and thrombocytopenia. He is currently undergoing work-up for infectious versus malignant causes  Principal Problem:   Splenomegaly Active Problems:   Spleen injury   Other pancytopenia   Hemoperitoneum   Thrombocytopenia   Hypocalcemia   Hyperglycemia   AKI (acute kidney injury)   Splenomegaly with splenic laceration and pancytopenia: Differential includes malignancy (lymphoma, leukemia, other  myeloproliferative disorder) versus infectious causes. Normal cell morphology on blood smear review per Dr. Beryle Beams, hematologist. Mono screen and HIV negative.   - Heme/onc consulted, appreciate recommendations - General surgery consulted, appreciate recommendations - no surgical intervention at this time - IR bone marrow biopsy scheduled from 8 am on 07/04/14 - If bone marrow biopsy nondiagnostic, will need diagnostic/therapeutic splenectomy - Follow-up studies: CMV, EBV, 25-OH Vit D, Mycoplasma, acute hepatitis panel - Daily CBC with diff - Pancytopenia management as below  Pancytopenia: WBC count continues to down-trend to 1.1 from 3.9 on admission (neutropenic with ANC 500, lymophenic with ALC 400). Platelets 55 and Hgb 12.3 today. Labs from 06/24/14 showed WBC 5.4, platelets 133. Most likely secondary to bone marrow process or infectious  etiology as above, although hypersplenism could also decrease counts. High LDH, uric acid, and indirect bilirubin suggest hemolysis may be contributing to anemia. - Plan as above - Daily CBC with diff - Neutropenic precautions  AKI: Resolved. Cr 1.6 on admission and appeared dry, Cr was 1.29 on 06/26/13. Now about stable at Cr 1.19. Urine output appropriate. - Continue to monitor  Hyperlipidemia: continue home pravastatin $RemoveBeforeDE'20mg'[id number]$  QHS  Depression: continue home sertraline $RemoveBeforeD'25mg'[id number]$  daily  BPH: continue home tamsulosin 0.$RemoveBeforeDEI'4mg'[id number]$  QHS  FEN: Regular diet, NPO after midnight  Dispo: Floor status. Discharge pending stable status after bone marrow biopsy scheduled for 07/04/14  Length of Stay: 2 day(s)   Signed: Betsey Amen, MS4

## 2014-07-03 NOTE — Progress Notes (Signed)
Patient seen and examined. Case d/w residents in detail. I agree with findings and plan as documented in Dr. Wyatt HasteNgo's note.  Patient feels better today. Tolerating PO and HA has resolved. He is scheduled for a BM biopsy with IR in the AM for pancytopenia. Will f/u CMV, mycoplasma titres. No acute surgical intervention for splenomegaly at this time.

## 2014-07-03 NOTE — Progress Notes (Signed)
Patient c/o "bad headache and nauseated", paged MD, spoke with Dr Vivianne Masterroung and advised of above. Will put orders in.

## 2014-07-04 ENCOUNTER — Inpatient Hospital Stay (HOSPITAL_COMMUNITY): Payer: Medicare Other

## 2014-07-04 DIAGNOSIS — R58 Hemorrhage, not elsewhere classified: Secondary | ICD-10-CM

## 2014-07-04 DIAGNOSIS — E559 Vitamin D deficiency, unspecified: Secondary | ICD-10-CM | POA: Diagnosis present

## 2014-07-04 LAB — CBC WITH DIFFERENTIAL/PLATELET
BASOS PCT: 1 % (ref 0–1)
Basophils Absolute: 0 10*3/uL (ref 0.0–0.1)
EOS PCT: 0 % (ref 0–5)
Eosinophils Absolute: 0 10*3/uL (ref 0.0–0.7)
HCT: 36.9 % — ABNORMAL LOW (ref 39.0–52.0)
Hemoglobin: 12.8 g/dL — ABNORMAL LOW (ref 13.0–17.0)
Lymphocytes Relative: 34 % (ref 12–46)
Lymphs Abs: 0.5 10*3/uL — ABNORMAL LOW (ref 0.7–4.0)
MCH: 28 pg (ref 26.0–34.0)
MCHC: 34.7 g/dL (ref 30.0–36.0)
MCV: 80.7 fL (ref 78.0–100.0)
MONOS PCT: 12 % (ref 3–12)
Monocytes Absolute: 0.2 10*3/uL (ref 0.1–1.0)
Neutro Abs: 0.9 10*3/uL — ABNORMAL LOW (ref 1.7–7.7)
Neutrophils Relative %: 53 % (ref 43–77)
Platelets: 60 10*3/uL — ABNORMAL LOW (ref 150–400)
RBC: 4.57 MIL/uL (ref 4.22–5.81)
RDW: 13.9 % (ref 11.5–15.5)
Smear Review: DECREASED
WBC: 1.6 10*3/uL — AB (ref 4.0–10.5)

## 2014-07-04 LAB — VITAMIN D 25 HYDROXY (VIT D DEFICIENCY, FRACTURES): Vit D, 25-Hydroxy: 26.9 ng/mL — ABNORMAL LOW (ref 30.0–100.0)

## 2014-07-04 LAB — HEPATITIS PANEL, ACUTE
HEP B S AG: NEGATIVE — AB
Hep A IgM: NEGATIVE — AB
Hep B C IgM: NEGATIVE — AB

## 2014-07-04 LAB — HEMOGLOBIN A1C
HEMOGLOBIN A1C: 5.8 % — AB (ref 4.8–5.6)
Mean Plasma Glucose: 120 mg/dL

## 2014-07-04 LAB — EBV AB TO VIRAL CAPSID AG PNL, IGG+IGM
EBV VCA IGG: 262 U/mL — AB (ref 0.0–17.9)
EBV VCA IgM: <36 U/mL (ref 0.0–35.9)

## 2014-07-04 LAB — BONE MARROW EXAM

## 2014-07-04 LAB — CMV ANTIBODY, IGG (EIA): CMV Ab - IgG: >10 U/mL — ABNORMAL HIGH (ref 0.00–0.59)

## 2014-07-04 LAB — CMV IGM: CMV IgM: <30 AU/mL (ref 0.0–29.9)

## 2014-07-04 MED ORDER — LIDOCAINE HCL 1 % IJ SOLN
INTRAMUSCULAR | Status: AC
  Filled 2014-07-04: qty 20

## 2014-07-04 MED ORDER — MIDAZOLAM HCL 2 MG/2ML IJ SOLN
INTRAMUSCULAR | Status: AC
  Filled 2014-07-04: qty 2

## 2014-07-04 MED ORDER — FENTANYL CITRATE (PF) 100 MCG/2ML IJ SOLN
INTRAMUSCULAR | Status: AC | PRN
  Administered 2014-07-04: 50 ug via INTRAVENOUS
  Administered 2014-07-04: 25 ug via INTRAVENOUS

## 2014-07-04 MED ORDER — VITAMIN D3 25 MCG (1000 UNIT) PO TABS
1000.0000 [IU] | ORAL_TABLET | Freq: Every day | ORAL | Status: DC
  Administered 2014-07-04 – 2014-07-05 (×2): 1000 [IU] via ORAL
  Filled 2014-07-04 (×2): qty 1

## 2014-07-04 MED ORDER — FENTANYL CITRATE (PF) 100 MCG/2ML IJ SOLN
INTRAMUSCULAR | Status: AC
  Filled 2014-07-04: qty 2

## 2014-07-04 MED ORDER — MIDAZOLAM HCL 2 MG/2ML IJ SOLN
INTRAMUSCULAR | Status: AC | PRN
  Administered 2014-07-04 (×2): 1 mg via INTRAVENOUS

## 2014-07-04 NOTE — Sedation Documentation (Signed)
Patient denies pain and is resting comfortably.  

## 2014-07-04 NOTE — Progress Notes (Signed)
Patient seen and examined. Case d/w residents in detail. I agree with findings and plan as documented in Dr. Wyatt HasteNgo's note  Patient with no new complaints. He is s/p BM biopsy. Will f/u results. Blood counts are stable. Will monitor for now. Patient with possible leukemia/high grade lymphoma and may need transfer depending on BM biopsy results

## 2014-07-04 NOTE — Progress Notes (Signed)
Subjective:    No acute issues overnight. Patient underwent IR bone marrow biopsy today 6/2, tolerated procedure well with no pain or bleeding. He reports mild abdominal tenderness in periumbilical and LUQ area. No other complaints.   Objective:    Vital Signs:   Temp:  [98.1 F (36.7 C)-99.3 F (37.4 C)] 98.1 F (36.7 C) (06/02 1442) Pulse Rate:  [58-71] 71 (06/02 1442) Resp:  [9-24] 16 (06/02 1442) BP: (120-152)/(51-75) 135/70 mmHg (06/02 1442) SpO2:  [96 %-100 %] 96 % (06/02 1442) Last BM Date: 06/29/14  24-hour weight change: Weight change:   Intake/Output:   Intake/Output Summary (Last 24 hours) at 07/04/14 1512 Last data filed at 07/04/14 0900  Gross per 24 hour  Intake    120 ml  Output    300 ml  Net   -180 ml      Physical Exam: General: Vital signs reviewed and noted. No acute distress, alert and interactive, sitting up in bed.  Lungs:  Normal respiratory effort. Clear to auscultation BL without crackles or wheezes.  Heart: RRR. S1 and S2 normal without gallop, murmur, or rubs.  Abdomen:  BS normoactive. Soft, Nondistended, non-tender.  No masses or organomegaly.  Extremities: No pretibial edema.     Labs:  Basic Metabolic Panel:  Recent Labs Lab 07/01/14 1010 07/01/14 1301 07/02/14 0441 07/03/14 0708  NA 137 137 138 138  K 3.9 3.6 4.0 3.8  CL 102 102 109 105  CO2 25  --  24 25  GLUCOSE 136* 119* 105* 110*  BUN 17 21* 14 11  CREATININE 1.58* 1.60* 1.13 1.19  CALCIUM 8.8*  --  7.8* 8.0*  MG  --   --   --  2.2  PHOS  --   --   --  2.3*    CBC:  Recent Labs Lab 07/01/14 1010 07/01/14 1301 07/02/14 0441 07/03/14 0708 07/04/14 0653  WBC 3.9*  --  1.5* 1.1* 1.6*  NEUTROABS 3.2  --   --  0.5* 0.9*  HGB 14.7 15.0 12.6* 12.3* 12.8*  HCT 42.9 44.0 36.6* 35.6* 36.9*  MCV 82.2  --  82.1 81.5 80.7  PLT 65*  --  50* 55* 60*   Coagulation Studies:  Recent Labs  07/02/14 0441  LABPROT 15.0  INR 1.16      Medications:     Infusions:    Scheduled Medications: . cholecalciferol  1,000 Units Oral Daily  . fentaNYL      . lidocaine      . midazolam      . pravastatin  20 mg Oral QHS  . sertraline  25 mg Oral QHS  . tamsulosin  0.4 mg Oral QHS    PRN Medications: acetaminophen **OR** acetaminophen, bisacodyl   Assessment/ Plan:    Mr. Boedecker is a 74 year old man on hospital day #3 who presented with splenomegaly, leukopenia, and thrombocytopenia.   Principal Problem:   Splenomegaly Active Problems:   Spleen injury   Other pancytopenia   Hemoperitoneum   Thrombocytopenia   Hypocalcemia   Hyperglycemia   AKI (acute kidney injury)   Vitamin D insufficiency   Splenomegaly with splenic laceration and pancytopenia: Differential includes malignancy (lymphoma, leukemia, other myeloproliferative disorder) versus infectious causes. Normal cell morphology with no immature cells on blood smear review per Dr. Beryle Beams, hematologist. Mono screen, HIV Ab, CMV IgM, and hepatitis panel negative. - Heme/onc consulted, appreciate recommendations - General surgery consulted, appreciate recommendations - no surgical intervention at this time -  IR bone marrow biopsy performed 07/04/14, expect preliminary results 6/3. Plan to transfer to tertiary care center if preliminary findings concerning for acute leukemia. If consistent with high-grade lymphoma, will transfer patient to [facility] for specialty care. If biopsy findings are non-diagnostic, would consider diagnostic/therapeutic splenectomy. - Follow-up studies:Mycoplasma, EBV - Daily CBC with diff - Pancytopenia management as below  Pancytopenia: WBC count stable at 1.6, down from 3.9 on admission (borderline neutropenia at [facility] 500). Thrombocytopenia improving minimally, platelets at 60K. H/H remain stable with mild anemia. Most likely secondary to bone marrow process or infectious etiology as above, although hypersplenism could also decrease counts.  High LDH, uric acid, and indirect bilirubin suggest hemolysis may be contributing to anemia. - Plan as above - Daily CBC with diff - Neutropenic precautions  Vitamin D insufficiency:  - Start cholecalciferol 1000 units daily  AKI: Resolved. Creatinine stable at 1.19, at baseline.  Hyperlipidemia: continue home pravastatin 13m QHS  Depression: continue home sertraline 239mdaily  BPH: continue home tamsulosin 0.70m59mHS  FEN: Regular diet  Dispo: Floor status. Disposition deferred pending improvement of current medical problems. Anticipated discharge in approximately 1-2 days.   The patient does not have a current PCP (No primary care provider on file.) and [facility].    Does the patient have transportation limitations that hinder transportation to clinic appointments? no   [place]      Y = Yes, Blank = No PT:   OT:   RN:   Equipment:   Other:      Length of Stay: 3 day(s)   Signed: JorBetsey AmenS4

## 2014-07-04 NOTE — Procedures (Signed)
Interventional Radiology Procedure Note  Procedure: CT guided aspirate and core biopsy of right iliac bone Complications: None Recommendations: - Bedrest supine x 2 hrs - Hydrocodone PRN  Pain - Follow biopsy results  Signed,  Osmany Azer K. Shawanna Zanders, MD   

## 2014-07-04 NOTE — Progress Notes (Signed)
Subjective:  No acute issues overnight. Mild tenderness in the abdomen in the area along the splenic border area. No reports of bleeding. Tolerated IR bone marrow biopsy well.  Objective: Vital signs in last 24 hours: Filed Vitals:   07/04/14 0607 07/04/14 0821 07/04/14 0830 07/04/14 0845  BP: 120/51 152/74 148/72 132/72  Pulse: 62 67 65 62  Temp: 99.3 F (37.4 C)     TempSrc: Oral     Resp: _0 Height:      Weight:      SpO2: 98% 100% 99% 96%   Weight change:   Intake/Output Summary (Last 24 hours) at 07/04/14 1134 Last data filed at 07/04/14 0900  Gross per 24 hour  Intake    240 ml  Output   1000 ml  Net   -760 ml   General Apperance: NAD HEENT: Normocephalic, atraumatic, PERRL, EOMI, anicteric sclera Neck: Supple, trachea midline Lungs: Clear to auscultation bilaterally. No wheezes, rhonchi or rales. Breathing comfortably Heart: Regular rate and rhythm, no murmur/rub/gallop Abdomen: Soft, mild left lower quadrant tenderness, nondistended, no rebound/guarding, + splenomegaly Back: Bone marrow biopsy site clean dry and intact Extremities: Normal, atraumatic, warm and well perfused, no edema Pulses: 2+ throughout Skin: No rashes or lesions Neurologic: Alert and oriented x 3. CNII-XII intact. Normal strength and sensation  Lab Results: Basic Metabolic Panel:  Recent Labs Lab 07/02/14 0441 07/03/14 0708  NA 138 138  K 4.0 3.8  CL 109 105  CO2 24 25  GLUCOSE 105* 110*  BUN 14 11  CREATININE 1.13 1.19  CALCIUM 7.8* 8.0*  MG  --  2.2  PHOS  --  2.3*   Liver Function Tests:  Recent Labs Lab 07/01/14 1010 07/01/14 1715 07/02/14 0441  AST 60*  --  38  ALT 52  --  41  ALKPHOS 76  --  59  BILITOT 2.5* 1.6* 1.8*  PROT 7.0  --  5.5*  ALBUMIN 4.2  --  3.5    Recent Labs Lab 07/01/14 1010  LIPASE 26   CBC:  Recent Labs Lab 07/03/14 0708 07/04/14 0653  WBC 1.1* 1.6*  NEUTROABS 0.5* 0.9*  HGB 12.3* 12.8*  HCT 35.6* 36.9*  MCV 81.5  80.7  PLT 55* 60*   Coagulation:  Recent Labs Lab 07/01/14 1445 07/02/14 0441  LABPROT 15.9* 15.0  INR 1.25 1.16   Anemia Panel:  Recent Labs Lab 07/01/14 1715  RETICCTPCT 1.0   Urinalysis:  Recent Labs Lab 07/01/14 1057  COLORURINE ORANGE*  LABSPEC 1.028  PHURINE 5.0  GLUCOSEU NEGATIVE  HGBUR NEGATIVE  BILIRUBINUR SMALL*  KETONESUR 15*  PROTEINUR 30*  UROBILINOGEN 1.0  NITRITE NEGATIVE  LEUKOCYTESUR TRACE*    Micro Results: No results found for this or any previous visit (from the past 240 hour(s)). Studies/Results: No results found. Medications: I have reviewed the patient's current medications. Scheduled Meds: . cholecalciferol  1,000 Units Oral Daily  . fentaNYL      . lidocaine      . midazolam      . pravastatin  20 mg Oral QHS  . sertraline  25 mg Oral QHS  . tamsulosin  0.4 mg Oral QHS   Continuous Infusions:  PRN Meds:.acetaminophen **OR** acetaminophen, bisacodyl Assessment/Plan: Principal Problem:   Splenomegaly Active Problems:   Spleen injury   Other pancytopenia   Hemoperitoneum   Thrombocytopenia   Hypocalcemia   Hyperglycemia   AKI (acute kidney injury)   Vitamin D insufficiency  Splenomegaly  with mild intra-abdominal bleed: Patient status post IR guided bone marrow biopsy today. Differential overall include malignancy versus infectious causes. No immature cells on review of blood smear per Dr. Beryle Beams. Monospot negative. Reticulocytes unremarkable. -General surgery consulted, appreciate recommendations -Heme/onc consultation, appreciate recommendations -IR bone biopsy on 07/04/2014, expecting preliminary results tomorrow. May need tertiary care transfer if concerning for acute leukemia. Otherwise, will need transfer to [facility] long for high-grade lymphoma. Otherwise, if negative, may move toward splenectomy. -HIV, Monospot, CMV, hepatitis panel negative -EBV, mycoplasma  Pancytopenia: Continued downtrend of white count to 1.1  from 3.9 on admission (borderline neutropenic at 500). Platelets stable at 55. Hemoglobin stable at 12.3. No additional signs of bleeding. High LDH, uric acid, and indirect bilirubin suggestive of hemolysis. -Plan as above -CBC with differential -Neutropenic precautions  Vitamin D insufficiency: -Cholecalciferol 1000 units daily  AKI: Resolved. Creatinine stable at 1.19.  Hyperlipidemia: continue home pravastatin 45m QHS  Depression: continue home sertraline 223mdaily  BPH: continue home tamsulosin 0.38m40mHS  FEN:  -Regular diet  VTE ppx: SCDs  Dispo: Disposition is deferred at this time, awaiting improvement of current medical problems.  Anticipated discharge in approximately 1-2 day(s).   The patient does not have a current PCP (No primary care provider on file.) and does not [facility].  The patient does not have transportation limitations that hinder transportation to clinic appointments.  .Services Needed at time of discharge: Y = Yes, Blank = No PT:   OT:   RN:   Equipment:   Other:     LOS: 3 days   LawLuan MooreD 07/04/2014, 11:34 AM

## 2014-07-05 LAB — CBC WITH DIFFERENTIAL/PLATELET
Basophils Absolute: 0 10*3/uL (ref 0.0–0.1)
Basophils Relative: 1 % (ref 0–1)
EOS ABS: 0 10*3/uL (ref 0.0–0.7)
EOS PCT: 1 % (ref 0–5)
HEMATOCRIT: 36.1 % — AB (ref 39.0–52.0)
Hemoglobin: 12.5 g/dL — ABNORMAL LOW (ref 13.0–17.0)
Lymphocytes Relative: 36 % (ref 12–46)
Lymphs Abs: 0.8 10*3/uL (ref 0.7–4.0)
MCH: 28.1 pg (ref 26.0–34.0)
MCHC: 34.6 g/dL (ref 30.0–36.0)
MCV: 81.1 fL (ref 78.0–100.0)
Monocytes Absolute: 0.3 10*3/uL (ref 0.1–1.0)
Monocytes Relative: 11 % (ref 3–12)
Neutro Abs: 1.1 10*3/uL — ABNORMAL LOW (ref 1.7–7.7)
Neutrophils Relative %: 50 % (ref 43–77)
Platelets: 72 10*3/uL — ABNORMAL LOW (ref 150–400)
RBC: 4.45 MIL/uL (ref 4.22–5.81)
RDW: 14.1 % (ref 11.5–15.5)
WBC: 2.3 10*3/uL — AB (ref 4.0–10.5)

## 2014-07-05 MED ORDER — BISACODYL 5 MG PO TBEC: 5.0000 mg | DELAYED_RELEASE_TABLET | Freq: Every day | ORAL | Status: DC | PRN

## 2014-07-05 NOTE — Progress Notes (Signed)
Reviewed discharge instructions with pt.  Pt denied any needs at this time.  Pt waiting for transport.  Will take to discharge location when ride available.

## 2014-07-05 NOTE — Progress Notes (Signed)
Utilization Review completed. Ronald Londo RN BSN CM 

## 2014-07-05 NOTE — Progress Notes (Signed)
Patient seen and examined. Case d/w residents in detail. I agree with findings and plan as documented in Dr. Wyatt HasteNgo's note  Patient with no new complaints. BM biopsy with no evidence of malignancy. Pancytopenia is possibly secondary to infectious etiology - possibly viral.Discussed with heme - will check JAK 2 levels and have patient follow up as outpatient. Leukopenia and thrombocytopenia are improving. Will d/c patient home today with outpatient f/u with Dr. Cyndie ChimeGranfortuna.

## 2014-07-05 NOTE — Discharge Instructions (Signed)
Please follow up with Dr. Beryle Beams for the rest of your lab results. Please return to the hospital should you have any bleeding or worsened abdominal pain.    Bone Biopsy, Open An open bone biopsy is a surgical procedure to remove a small piece of bone. This piece of bone is examined under a microscope by a specialist [employer] forces logistics/support/administrative officer). It is usually taken in the area where X-rays and or MRI have identified a concern. The biopsy may be done to make sure something seen in the bone is not cancer or another problem that needs treatment. It can identify problems such as:  A tumor of the bone marrow (multiple myeloma).  Bone that forms abnormally (Paget's disease).  Benign bone cysts.  Bony growths.  An infection in the bone (osteomyelitis). LET YOUR CAREGIVER KNOW ABOUT:   Previous problems with anesthetics or medicines used to numb the skin.  Allergies to dyes, iodine, foods, or latex.  Medicines taken including herbs, eye drops, prescription medicines (especially medicines used to "thin the blood"), aspirin and other over-the-counter medicines, and steroids (by mouth or as a cream).  History of bleeding or blood problems.  Possibility of pregnancy, if this applies.  History of blood clots in your legs or lungs .  Previous surgery.  Other important health problems. RISKS AND COMPLICATIONS All surgery is associated with risks. Some of these risks are:   Excessive bleeding.  Infection.  Injury to surrounding tissue. BEFORE THE PROCEDURE  Ask your caregiver how long to withhold aspirin or blood thinners prior to the procedure, as these can cause excessive bleeding after surgery.  Do not eat or drink anything after midnight the night before the biopsy  Let your caregiver know if you develop a cold or other infectious problem prior to surgery.  You should be present 60 minutes prior to your procedure or as directed. PROCEDURE   A general anesthetic is usually given. This  means you will be asleep during the procedure. Sometimes a regional anesthesia is used. This means just the location of the surgical site will be numbed for the procedure.  After the sample is removed through a cut made by the surgeon, the cut is sewn up with stitches.  This is usually a same day surgery, although your caregiver may want you to stay overnight for observation. If you are allowed to go home, you can usually leave within a couple of hours after surgery as long as there are no complications. HOME CARE INSTRUCTIONS   Keep your surgery site clean and dry.  You may shower and bathe normally unless instructed otherwise by your surgeon.  Pat the wound dry and put on a new dressing (medication and bandage) after cleansing.  Protect the wound until your caregiver advises you can return to regular daily activities. Finding out the results of your test Not all test results are available during your visit. If your test results are not back during the visit, make an appointment with your caregiver to find out the results. Do not assume everything is normal if you have not heard from your caregiver or the medical facility. It is important for you to follow up on all of your test results.  SEEK MEDICAL CARE IF:   You have redness, swelling, or increasing pain in the surgical site.  You have pus coming from the surgical site.  You have drainage from the biopsy site lasting longer than 1 day.  You notice a bad smell coming from the biopsy site  or dressing.  You have a breaking open of the site (edges not staying together) after sutures have been removed.  You develop persistent nausea or vomiting. SEEK IMMEDIATE MEDICAL CARE IF:   You have a fever.  You develop a rash.  You have difficulty breathing.  You develop any reaction or side effects to medicines given. Document Released: 11/28/2003 Document Revised: 04/12/2011 Document Reviewed: 06/26/2008 [facility] Patient Information  2015 [place], [place]. This information is not intended to replace advice given to you by your health care provider. Make sure you discuss any questions you have with your health care provider.   Thrombocytopenia Thrombocytopenia is a condition in which there is an abnormally small number of platelets in your blood. Platelets are also called thrombocytes. Platelets are needed for blood clotting. CAUSES Thrombocytopenia is caused by:   Decreased production of platelets. This can be caused by:  Aplastic anemia in which your bone marrow quits making blood cells.  Cancer in the bone marrow.  Use of certain medicines, including chemotherapy.  Infection in the bone marrow.  Heavy alcohol consumption.  Increased destruction of platelets. This can be caused by:  Certain immune diseases.  Use of certain drugs.  Certain blood clotting disorders.  Certain inherited disorders.  Certain bleeding disorders.  Pregnancy.  Having an enlarged spleen (hypersplenism). In hypersplenism, the spleen gathers up platelets from circulation. This means the platelets are not available to help with blood clotting. The spleen can enlarge due to cirrhosis or other conditions. SYMPTOMS  The symptoms of thrombocytopenia are side effects of poor blood clotting. Some of these are:  Abnormal bleeding.  Nosebleeds.  Heavy menstrual periods.  Blood in the urine or stools.  Purpura. This is a purplish discoloration in the skin produced by small bleeding vessels near the surface of the skin.  Bruising.  A rash that may be petechial. This looks like pinpoint, purplish-red spots on the skin and mucous membranes. It is caused by bleeding from small blood vessels (capillaries). DIAGNOSIS  Your caregiver will make this diagnosis based on your exam and blood tests. Sometimes, a bone marrow study is done to look for the original cells (megakaryocytes) that make platelets. TREATMENT  Treatment depends on the  cause of the condition.  Medicines may be given to help protect your platelets from being destroyed.  In some cases, a replacement (transfusion) of platelets may be required to stop or prevent bleeding.  Sometimes, the spleen must be surgically removed. HOME CARE INSTRUCTIONS   Check the skin and linings inside your mouth for bruising or bleeding as directed by your caregiver.  Check your sputum, urine, and stool for blood as directed by your caregiver.  Do not return to any activities that could cause bumps or bruises until your caregiver says it is okay.  Take extra care not to cut yourself when shaving or when using scissors, needles, knives, and other tools.  Take extra care not to burn yourself when ironing or cooking.  Ask your caregiver if it is okay for you to drink alcohol.  Only take over-the-counter or prescription medicines as directed by your caregiver.  Notify all your caregivers, including dentists and eye doctors, about your condition. SEEK IMMEDIATE MEDICAL CARE IF:   You develop active bleeding from anywhere in your body.  You develop unexplained bruising or bleeding.  You have blood in your sputum, urine, or stool. MAKE SURE YOU:  Understand these instructions.  Will watch your condition.  Will get help right away if  you are not doing well or get worse. Document Released: 01/18/2005 Document Revised: 04/12/2011 Document Reviewed: 11/20/2010 [facility] Patient Information 2015 [place], [place]. This information is not intended to replace advice given to you by your health care provider. Make sure you discuss any questions you have with your health care provider.

## 2014-07-05 NOTE — Progress Notes (Signed)
Subjective:  No acute issues overnight. No complaints this morning. Still awaiting biopsy results.   Objective: Vital signs in last 24 hours: Filed Vitals:   07/04/14 0845 07/04/14 1442 07/04/14 2110 07/05/14 0532  BP: 132/72 135/70 133/88 136/68  Pulse: 62 71 66 56  Temp:  98.1 F (36.7 C) 98.4 F (36.9 C) 98 F (36.7 C)  TempSrc:  Oral Oral Oral  Resp: _0 Height:      Weight:      SpO2: 96% 96% 98% 96%   Weight change:   Intake/Output Summary (Last 24 hours) at 07/05/14 1113 Last data filed at 07/05/14 1022  Gross per 24 hour  Intake    240 ml  Output   1250 ml  Net  -1010 ml   General Apperance: NAD HEENT: Normocephalic, atraumatic, PERRL, EOMI, anicteric sclera Neck: Supple, trachea midline Lungs: Clear to auscultation bilaterally. No wheezes, rhonchi or rales. Breathing comfortably Heart: Regular rate and rhythm, no murmur/rub/gallop Abdomen: Soft, mild left lower quadrant tenderness, nondistended, no rebound/guarding, + splenomegaly Back: Bone marrow biopsy site clean dry and intact Extremities: Normal, atraumatic, warm and well perfused, no edema Pulses: 2+ throughout Skin: No rashes or lesions Neurologic: Alert and oriented x 3. CNII-XII intact. Normal strength and sensation  Lab Results: Basic Metabolic Panel:  Recent Labs Lab 07/02/14 0441 07/03/14 0708  NA 138 138  K 4.0 3.8  CL 109 105  CO2 24 25  GLUCOSE 105* 110*  BUN 14 11  CREATININE 1.13 1.19  CALCIUM 7.8* 8.0*  MG  --  2.2  PHOS  --  2.3*   Liver Function Tests:  Recent Labs Lab 07/01/14 1010 07/01/14 1715 07/02/14 0441  AST 60*  --  38  ALT 52  --  41  ALKPHOS 76  --  59  BILITOT 2.5* 1.6* 1.8*  PROT 7.0  --  5.5*  ALBUMIN 4.2  --  3.5    Recent Labs Lab 07/01/14 1010  LIPASE 26   CBC:  Recent Labs Lab 07/04/14 0653 07/05/14 0706  WBC 1.6* 2.3*  NEUTROABS 0.9* 1.1*  HGB 12.8* 12.5*  HCT 36.9* 36.1*  MCV 80.7 81.1  PLT 60* 72*    Coagulation:  Recent Labs Lab 07/01/14 1445 07/02/14 0441  LABPROT 15.9* 15.0  INR 1.25 1.16   Anemia Panel:  Recent Labs Lab 07/01/14 1715  RETICCTPCT 1.0   Urinalysis:  Recent Labs Lab 07/01/14 1057  COLORURINE ORANGE*  LABSPEC 1.028  PHURINE 5.0  GLUCOSEU NEGATIVE  HGBUR NEGATIVE  BILIRUBINUR SMALL*  KETONESUR 15*  PROTEINUR 30*  UROBILINOGEN 1.0  NITRITE NEGATIVE  LEUKOCYTESUR TRACE*    Micro Results: No results found for this or any previous visit (from the past 240 hour(s)). Studies/Results: Ct Biopsy  07/04/2014   CLINICAL DATA:  74 year old male with pancytopenia and splenomegaly. He presents for CT-guided bone marrow biopsy.  EXAM: CT BIOPSY  Date: 07/04/2014  PROCEDURE: 1. CT guided bone marrow aspiration and core biopsy Interventional Radiologist:  Criselda Peaches, MD  ANESTHESIA/SEDATION: Moderate (conscious) sedation was used. 2 mg Versed, 75 mcg Fentanyl were administered intravenously. The patient's vital signs were monitored continuously by radiology nursing throughout the procedure.  Sedation Time: 7 minutes  FLUOROSCOPY TIME:  None  TECHNIQUE: Informed consent was obtained from the patient following explanation of the procedure, risks, benefits and alternatives. The patient understands, agrees and consents for the procedure. All questions were addressed. A time out was performed.  The patient  was positioned prone and noncontrast localization CT was performed of the pelvis to demonstrate the iliac marrow spaces.  Maximal barrier sterile technique utilized including caps, mask, sterile gowns, sterile gloves, large sterile drape, hand hygiene, and betadine prep.  Under sterile conditions and local anesthesia, an 11 gauge coaxial bone biopsy needle was advanced into the right iliac marrow space. Needle position was confirmed with CT imaging. Initially, bone marrow aspiration was performed. Next, the 11 gauge outer cannula was utilized to obtain a right iliac  bone marrow core biopsy. Needle was removed. Hemostasis was obtained with compression. The patient tolerated the procedure well. Samples were prepared with the cytotechnologist. No immediate complications.  IMPRESSION: CT guided right iliac bone marrow aspiration and core biopsy.   Electronically Signed   By: Jacqulynn Cadet M.D.   On: 07/04/2014 18:07   Medications: I have reviewed the patient's current medications. Scheduled Meds: . cholecalciferol  1,000 Units Oral Daily  . pravastatin  20 mg Oral QHS  . sertraline  25 mg Oral QHS  . tamsulosin  0.4 mg Oral QHS   Continuous Infusions:  PRN Meds:.acetaminophen **OR** acetaminophen, bisacodyl Assessment/Plan: Principal Problem:   Splenomegaly Active Problems:   Spleen injury   Other pancytopenia   Hemoperitoneum   Thrombocytopenia   Hypocalcemia   Prediabetes   AKI (acute kidney injury)   Vitamin D insufficiency  Splenomegaly with mild intra-abdominal bleed: Patient status post IR guided bone marrow biopsy yesterday. Differential overall include malignancy versus infectious causes. No immature cells on review of blood smear per Dr. Beryle Beams. Monospot negative. Reticulocytes unremarkable. -General surgery consulted, appreciate recommendations -Heme/onc consultation, appreciate recommendations -IR bone biopsy on 07/04/2014, expecting preliminary results today. May need tertiary care transfer if concerning for acute leukemia. Otherwise, will need transfer to [facility] long for high-grade lymphoma. Otherwise, if negative, may move toward splenectomy. -HIV, Monospot, CMV, hepatitis panel negative -EBV, mycoplasma  Pancytopenia: White count up to 2.3 with ANC of 1100. Platelets stable at 72. Hemoglobin stable at 12.5. No additional signs of bleeding. High LDH, uric acid, and indirect bilirubin suggestive of hemolysis. -Plan as above -CBC with differential -Neutropenic precautions  Vitamin D insufficiency: -Cholecalciferol 1000 units  daily  AKI: Resolved.   Hyperlipidemia: continue home pravastatin 63m QHS  Depression: continue home sertraline 235mdaily  BPH: continue home tamsulosin 0.30m30mHS  FEN:  -Regular diet  VTE ppx: SCDs  Dispo: Disposition is deferred at this time, awaiting improvement of current medical problems.  Anticipated discharge in approximately 1-2 day(s).   The patient does not have a current PCP (No primary care provider on file.) and [facility].  The patient does not have transportation limitations that hinder transportation to clinic appointments.  .Services Needed at time of discharge: Y = Yes, Blank = No PT:   OT:   RN:   Equipment:   Other:     LOS: 4 days   LawLuan MooreD 07/05/2014, 11:13 AM

## 2014-07-06 NOTE — Discharge Summary (Signed)
Name: Kevin Santiago MRN: [id number] DOB: Mar 27, 1940 74 y.o. PCP: No primary care provider on file.  Date of Admission: 07/01/2014  9:39 AM Date of Discharge: 07/06/2014 Attending Physician: Dr. Aldine Contes  Discharge Diagnosis: Principal Problem:   Splenomegaly Active Problems:   Spleen injury   Other pancytopenia   Hemoperitoneum   Thrombocytopenia   Hypocalcemia   Prediabetes   AKI (acute kidney injury)   Vitamin D insufficiency  Discharge Medications:   Medication List    TAKE these medications        aspirin EC 81 MG tablet  Take 81 mg by mouth daily.     bisacodyl 5 MG EC tablet  Commonly known as:  DULCOLAX  Take 1 tablet (5 mg total) by mouth daily as needed for moderate constipation.     CALCIUM 500 PO  Take 1 tablet by mouth daily.     cholecalciferol 1000 UNITS tablet  Commonly known as:  VITAMIN D  Take 1,000 Units by mouth at bedtime.     hydrocortisone 2.5 % rectal cream  Commonly known as:  ANUSOL-HC  Place 1 application rectally 2 (two) times daily.     hydroxypropyl methylcellulose / hypromellose 2.5 % ophthalmic solution  Commonly known as:  ISOPTO TEARS / GONIOVISC  Place 2 drops into both eyes 2 (two) times daily as needed for dry eyes.     omeprazole 40 MG capsule  Commonly known as:  PRILOSEC  Take 40 mg by mouth daily as needed (ACID REFLUX).     ondansetron 4 MG disintegrating tablet  Commonly known as:  ZOFRAN ODT  Take 1 tablet (4 mg total) by mouth every 6 (six) hours as needed for nausea or vomiting.     pravastatin 20 MG tablet  Commonly known as:  PRAVACHOL  Take 20 mg by mouth at bedtime.     PRESCRIPTION MEDICATION  Pt gets infusion once a year for osteoporosis  (RECLAST?)  at the [facility] in [place] - Last dose was Feb 2016     sertraline 25 MG tablet  Commonly known as:  ZOLOFT  Take 25 mg by mouth at bedtime.     tamsulosin 0.4 MG Caps capsule  Commonly known as:  FLOMAX  Take 0.4 mg by mouth at bedtime.          Disposition and follow-up:   KevinAhmani A Santiago was discharged from [facility] in [place] condition.  At the hospital follow up visit please address:  Splenomegaly with pancytopenia and mild intra-abdominal bleed: Would follow-up mycoplasma and JAK2 which are pending at the time of discharge. Would also follow up final pathology report from bone marrow biopsy and aspiration.  2.  Labs / imaging needed at time of follow-up: Per hematology/oncology  3.  Pending labs/ test needing follow-up: none  Follow-up Appointments:     Follow-up Information    Follow up with Annia Belt, MD.   Specialty:  Oncology   Why:  07/16/14 3:30 pm   Contact information:   [place] [id number] [phone]       Discharge Instructions: Discharge Instructions    Call MD for:  difficulty breathing, headache or visual disturbances    Complete by:  As directed      Call MD for:  extreme fatigue    Complete by:  As directed      Call MD for:  hives    Complete by:  As directed  Call MD for:  persistant dizziness or light-headedness    Complete by:  As directed      Call MD for:  persistant nausea and vomiting    Complete by:  As directed      Call MD for:  redness, tenderness, or signs of infection (pain, swelling, redness, odor or green/yellow discharge around incision site)    Complete by:  As directed      Call MD for:  severe uncontrolled pain    Complete by:  As directed      Call MD for:  temperature >100.4    Complete by:  As directed      Diet - low sodium heart healthy    Complete by:  As directed      Increase activity slowly    Complete by:  As directed            Consultations: Treatment Team:  Annia Belt, MD  Procedures Performed:  Ct Abdomen Pelvis W Contrast  07/01/2014   CLINICAL DATA:  General body aches, pain and fever.  EXAM: CT ABDOMEN AND PELVIS WITH CONTRAST  TECHNIQUE: Multidetector CT imaging of the abdomen and  pelvis was performed using the standard protocol following bolus administration of intravenous contrast.  CONTRAST:  166m OMNIPAQUE IOHEXOL 300 MG/ML  SOLN  COMPARISON:  06/26/2013  FINDINGS: Lower chest: The lung bases are clear. There is no pleural effusion.  Hepatobiliary: Small low-attenuation foci within the liver are noted. The largest measures 5 mm and is unchanged from previous exam, likely cyst. The spleen is normal. The gallbladder is normal. No biliary dilatation.  Pancreas: Negative  Spleen: Progressive splenomegaly. The spleen currently measures 16 cm in length. There is a subtle focus of low attenuation within the mid spleen, image 27/ series 201.  Adrenals/Urinary Tract: The adrenal glands are normal. Unremarkable appearance of the left kidney. There is a cyst within the right kidney measuring 1.1 cm, image 30/ series 201. The urinary bladder appears normal.  Stomach/Bowel: The stomach and the small bowel loops have a normal course and caliber. There is no evidence for bowel obstruction. Normal appearance of the colon.  Vascular/Lymphatic: Normal appearance of the abdominal aorta. No enlarged retroperitoneal or mesenteric adenopathy. No enlarged pelvic or inguinal lymph nodes.  Reproductive: Prostate gland and seminal vesicles are normal.  Other: There is high attenuation fluid identified around the liver and spleen which is concerning for hemo peritoneum.  Musculoskeletal: No acute bone abnormalities noted.  IMPRESSION: 1. There is high attenuation fluid around the liver and spleen consistent with hemo peritoneum. 2. Subtle area of low attenuation within the enlarged left spleen which may indicate a small grade 1 laceration. This may explain the presence of hemo peritoneum. Alternatively, the patient is noted have very low platelets which could result in spontaneous hemorrhage. Further workup advised. 3. Critical Value/emergent results were called by telephone at the time of interpretation on  07/01/2014 at 12:00 pm to Dr. SSherwood Gambler, who verbally acknowledged these results.   Electronically Signed   By: TKerby MoorsM.D.   On: 07/01/2014 12:00   Ct Biopsy  07/04/2014   CLINICAL DATA:  74year old male with pancytopenia and splenomegaly. He presents for CT-guided bone marrow biopsy.  EXAM: CT BIOPSY  Date: 07/04/2014  PROCEDURE: 1. CT guided bone marrow aspiration and core biopsy Interventional Radiologist:  HCriselda Peaches MD  ANESTHESIA/SEDATION: Moderate (conscious) sedation was used. 2 mg Versed, 75 mcg Fentanyl were administered intravenously. The patient's  vital signs were monitored continuously by radiology nursing throughout the procedure.  Sedation Time: 7 minutes  FLUOROSCOPY TIME:  None  TECHNIQUE: Informed consent was obtained from the patient following explanation of the procedure, risks, benefits and alternatives. The patient understands, agrees and consents for the procedure. All questions were addressed. A time out was performed.  The patient was positioned prone and noncontrast localization CT was performed of the pelvis to demonstrate the iliac marrow spaces.  Maximal barrier sterile technique utilized including caps, mask, sterile gowns, sterile gloves, large sterile drape, hand hygiene, and betadine prep.  Under sterile conditions and local anesthesia, an 11 gauge coaxial bone biopsy needle was advanced into the right iliac marrow space. Needle position was confirmed with CT imaging. Initially, bone marrow aspiration was performed. Next, the 11 gauge outer cannula was utilized to obtain a right iliac bone marrow core biopsy. Needle was removed. Hemostasis was obtained with compression. The patient tolerated the procedure well. Samples were prepared with the cytotechnologist. No immediate complications.  IMPRESSION: CT guided right iliac bone marrow aspiration and core biopsy.   Electronically Signed   By: Jacqulynn Cadet M.D.   On: 07/04/2014 18:07    2D Echo:  none  Cardiac Cath: none  Admission HPI:   Mr. EDRIS FRIEDT is a 74 year old man with history of HLD and depression presenting with abdominal pain. He had subjective fevers on Saturday night. He reports myalgias Saturday. Initially felt like he was dehydrated. This resolved yesterday after taking a shower. This morning, he started having a LUQ abdominal pain that is sharp and constant. Worse with breathing. No relieving factors. Denies trauma. Denies previous episodes. He also reports diaphoresis, dizziness, pre-syncope, emesis. Denies cough, hemetemesis, diarrhea, hematochezia, melena, dysuria, hematuria, edema.  Hospital Course by problem list: Principal Problem:   Splenomegaly Active Problems:   Spleen injury   Other pancytopenia   Hemoperitoneum   Thrombocytopenia   Hypocalcemia   Prediabetes   AKI (acute kidney injury)   Vitamin D insufficiency   Splenomegaly with pancytopenia and mild intra-abdominal bleed: Patient initially presenting with abdominal pain with a CT abdomen and pelvis on the day of admission remarkable for mild hemoperitoneum along with splenomegaly associated with a small grade 1 laceration. INR of 1.25 and PTT of 2.9, although patient also found to be thrombocytopenic with platelets of 65. Patient also found to have a leukopenia of 3.9. Dr. Beryle Beams of hematology oncology was consulted. Findings were thought to be suspicious for a primary bone marrow disorder: lymphoma versus leukemia. Other infiltrative diseases of the bone marrow are also a consideration. Patient was arranged for a bone marrow biopsy and aspiration under sedation with interventional radiology. Preliminary read on bone marrow biopsy and aspiration unremarkable for any evidence of malignancy. Further workup for infectious causes also unremarkable given negative HIV, EBV, CMV, and hepatitis panel. Pancytopenia slightly improved upon discharge with a white count of 2.3, hemoglobin of 12.5, and platelets  of 72. Would follow-up mycoplasma and JAK2 which are pending at the time of discharge. Would also follow up final pathology report from bone marrow biopsy and aspiration.  Vitamin D insufficiency: Patient was started on cholecalciferol 1000 units daily.  Hyperlipidemia: continued home pravastatin 83m QHS  Depression: continued home sertraline 271mdaily  BPH: continued home tamsulosin 0.32m3mHS  Discharge Vitals:   BP 136/88 mmHg  Pulse 63  Temp(Src) 98 F (36.7 C) (Oral)  Resp 16  Ht _0  (1.778 m)  Wt 180 lb (81.647 kg)  BMI 25.83 kg/m2  SpO2 100%  Discharge Labs:  No results found for this or any previous visit (from the past 24 hour(s)).  Signed: Luan Moore, MD 07/06/2014, 4:19 PM    Services Ordered on Discharge: none Equipment Ordered on Discharge: none

## 2014-07-07 LAB — MYCOPLASMA PNEUMONIAE ANTIBODY, IGM: Mycoplasma pneumo IgM: 36 U/mL (ref <770)

## 2014-07-09 LAB — JAK2 GENOTYPR

## 2014-07-15 LAB — CHROMOSOME ANALYSIS, BONE MARROW

## 2014-07-16 ENCOUNTER — Encounter: Payer: Self-pay | Admitting: Oncology

## 2014-07-16 ENCOUNTER — Ambulatory Visit (INDEPENDENT_AMBULATORY_CARE_PROVIDER_SITE_OTHER): Payer: Medicare Other | Admitting: Oncology

## 2014-07-16 VITALS — BP 129/78 | HR 77 | Temp 98.1°F | Ht 70.0 in | Wt 173.2 lb

## 2014-07-16 DIAGNOSIS — R161 Splenomegaly, not elsewhere classified: Secondary | ICD-10-CM | POA: Diagnosis not present

## 2014-07-16 DIAGNOSIS — K661 Hemoperitoneum: Secondary | ICD-10-CM | POA: Diagnosis not present

## 2014-07-16 DIAGNOSIS — D61818 Other pancytopenia: Secondary | ICD-10-CM | POA: Diagnosis not present

## 2014-07-16 LAB — CBC WITH DIFFERENTIAL/PLATELET
BASOS ABS: 0 10*3/uL (ref 0.0–0.1)
Basophils Relative: 0 % (ref 0–1)
EOS ABS: 0.1 10*3/uL (ref 0.0–0.7)
EOS PCT: 1 % (ref 0–5)
HCT: 37.2 % — ABNORMAL LOW (ref 39.0–52.0)
Hemoglobin: 12.8 g/dL — ABNORMAL LOW (ref 13.0–17.0)
Lymphocytes Relative: 32 % (ref 12–46)
Lymphs Abs: 2.6 10*3/uL (ref 0.7–4.0)
MCH: 27.6 pg (ref 26.0–34.0)
MCHC: 34.4 g/dL (ref 30.0–36.0)
MCV: 80.2 fL (ref 78.0–100.0)
MONO ABS: 0.5 10*3/uL (ref 0.1–1.0)
MPV: 10.2 fL (ref 8.6–12.4)
Monocytes Relative: 6 % (ref 3–12)
Neutro Abs: 4.9 10*3/uL (ref 1.7–7.7)
Neutrophils Relative %: 61 % (ref 43–77)
PLATELETS: 156 10*3/uL (ref 150–400)
RBC: 4.64 MIL/uL (ref 4.22–5.81)
RDW: 14.2 % (ref 11.5–15.5)
WBC: 8 10*3/uL (ref 4.0–10.5)

## 2014-07-16 NOTE — Patient Instructions (Addendum)
CBC today Schedule CT Abdomen  & lab for 8/30 MD visit 1-2 weeks after tests done
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
## 2014-07-18 ENCOUNTER — Telehealth: Payer: Self-pay | Admitting: *Deleted

## 2014-07-18 NOTE — Telephone Encounter (Signed)
Pt called / informed blood counts are almost completely back to normal per Dr Cyndie Chime.

## 2014-07-18 NOTE — Telephone Encounter (Signed)
-----   Message from Levert Feinstein, MD sent at 07/17/2014  3:09 PM EDT ----- Call pt: blood count almost completely back to normal!

## 2014-07-19 ENCOUNTER — Encounter (HOSPITAL_COMMUNITY): Payer: Self-pay

## 2014-08-21 ENCOUNTER — Telehealth: Payer: Self-pay | Admitting: *Deleted

## 2014-08-21 NOTE — Telephone Encounter (Signed)
Pt called - no answer; left message re: Ct Abdomen on 8/31@ 1300PM; arrive @ 1245PM; [facility]. Also need to pick-up contrast prior to test. And to call if he has any questions. Will mail appt.

## 2014-10-02 ENCOUNTER — Ambulatory Visit (HOSPITAL_COMMUNITY)
Admission: RE | Admit: 2014-10-02 | Discharge: 2014-10-02 | Disposition: A | Discharge status: Home / Self Care | Payer: Medicare Other | Source: Ambulatory Visit | Attending: Oncology | Admitting: Oncology

## 2014-10-02 ENCOUNTER — Encounter (HOSPITAL_COMMUNITY): Payer: Self-pay

## 2014-10-02 DIAGNOSIS — Z09 Encounter for follow-up examination after completed treatment for conditions other than malignant neoplasm: Secondary | ICD-10-CM | POA: Insufficient documentation

## 2014-10-02 DIAGNOSIS — R161 Splenomegaly, not elsewhere classified: Secondary | ICD-10-CM | POA: Diagnosis present

## 2014-10-02 DIAGNOSIS — K661 Hemoperitoneum: Secondary | ICD-10-CM

## 2014-10-02 DIAGNOSIS — D61818 Other pancytopenia: Secondary | ICD-10-CM

## 2014-10-04 ENCOUNTER — Telehealth: Payer: Self-pay | Admitting: *Deleted

## 2014-10-04 NOTE — Telephone Encounter (Signed)
-----   Message from Levert Feinstein, MD sent at 10/04/2014  3:19 PM EDT ----- Call pt: CT looking better. All hemorrhage resolved and spleen shrinking back to normal size

## 2014-10-04 NOTE — Telephone Encounter (Signed)
Pt called / informed "CT looking better. All hemorrhage resolved and spleen shrinking back to normal size" per Dr Cyndie Chime. Stated good; no questions.

## 2014-10-21 ENCOUNTER — Ambulatory Visit: Payer: Medicare Other | Admitting: Oncology

## 2014-10-21 ENCOUNTER — Encounter: Payer: Self-pay | Admitting: Oncology
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
## 2017-05-21 ENCOUNTER — Encounter (HOSPITAL_COMMUNITY): Payer: Self-pay | Admitting: *Deleted

## 2017-05-21 ENCOUNTER — Ambulatory Visit (HOSPITAL_COMMUNITY)
Admission: EM | Admit: 2017-05-21 | Discharge: 2017-05-21 | Disposition: A | Discharge status: Home / Self Care | Payer: Medicare Other | Attending: Family Medicine | Admitting: Family Medicine

## 2017-05-21 DIAGNOSIS — S61211A Laceration without foreign body of left index finger without damage to nail, initial encounter: Secondary | ICD-10-CM | POA: Diagnosis not present

## 2017-05-21 DIAGNOSIS — W268XXA Contact with other sharp object(s), not elsewhere classified, initial encounter: Secondary | ICD-10-CM | POA: Diagnosis not present

## 2017-05-21 HISTORY — DX: Essential (primary) hypertension: I10

## 2017-05-21 HISTORY — DX: Anxiety disorder, unspecified: F41.9
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
## 2017-05-21 NOTE — ED Notes (Signed)
Applied Atb ointment to wound, covered with nonadherent bandage and Coban. Pt tolerated well and understood care instructions.

## 2017-05-21 NOTE — Discharge Instructions (Signed)
Please keep clean and dry over the next 2-3 days, wash with warm soapy water twice a day and dry really well after cleaning and showering.  Please return in 7-10 days for suture removal.  You have 6 stitches.

## 2017-05-22 NOTE — ED Provider Notes (Signed)
[facility]    CSN: [id number] Arrival date & time: 05/21/17  1553     History   Chief Complaint Chief Complaint  Patient presents with  . Finger Injury    HPI Kevin Santiago is a 77 y.o. male presenting today for evaluation of a laceration to his left index finger.  States that he got it stuck between a metal grinder in the car earlier today.  Patient denies any difficulty moving his denies the possibility of there being lateral to the wound.  Patient was sent out with soap and water as well as hydrogen peroxide prior to arrival.  HPI  Past Medical History:  Diagnosis Date  . Anxiety   . Depression   . Hypercholesteremia   . Hypertension     Patient Active Problem List   Diagnosis Date Noted  . Vitamin D insufficiency 07/04/2014  . Hypocalcemia 07/02/2014  . Prediabetes 07/02/2014  . AKI (acute kidney injury) (HCC) 07/02/2014  . Other pancytopenia (HCC)   . Hemoperitoneum   . Thrombocytopenia (HCC)   . Splenomegaly 07/01/2014  . Spleen injury 07/01/2014    Past Surgical History:  Procedure Laterality Date  . cervical fusion    . SHOULDER SURGERY Left 1990  . WRIST FRACTURE SURGERY         Home Medications    Prior to Admission medications   Medication Sig Start Date End Date Taking? Authorizing Provider  pravastatin (PRAVACHOL) 20 MG tablet Take 20 mg by mouth at bedtime.   Yes [provider]  sertraline (ZOLOFT) 25 MG tablet Take 25 mg by mouth at bedtime.   Yes [provider]  UNKNOWN TO PATIENT HTN med   Yes [provider]  aspirin EC 81 MG tablet Take 81 mg by mouth daily.    [provider]  bisacodyl (DULCOLAX) 5 MG EC tablet Take 1 tablet (5 mg total) by mouth daily as needed for moderate constipation. 07/05/14   Harold Barban, MD  Calcium-Magnesium-Vitamin D (CALCIUM 500 PO) Take 1 tablet by mouth daily.    [provider]  cholecalciferol (VITAMIN D) 1000 UNITS tablet Take 1,000 Units by  mouth at bedtime.    [provider]  hydrocortisone (ANUSOL-HC) 2.5 % rectal cream Place 1 application rectally 2 (two) times daily. Patient taking differently: Place 1 application rectally 2 (two) times daily as needed for itching.  06/30/13   Riki Sheer, PA-C  hydroxypropyl methylcellulose (ISOPTO TEARS) 2.5 % ophthalmic solution Place 2 drops into both eyes 2 (two) times daily as needed for dry eyes.    [provider]  omeprazole (PRILOSEC) 40 MG capsule Take 40 mg by mouth daily as needed (ACID REFLUX).    [provider]  ondansetron (ZOFRAN ODT) 4 MG disintegrating tablet Take 1 tablet (4 mg total) by mouth every 6 (six) hours as needed for nausea or vomiting. 06/26/13   Pricilla Loveless, MD  PRESCRIPTION MEDICATION Pt gets infusion once a year for osteoporosis  (RECLAST?)  at the [facility] in [place] - Last dose was Feb 2016    [provider]  tamsulosin (FLOMAX) 0.4 MG CAPS capsule Take 0.4 mg by mouth at bedtime.    [provider]    Family History Family History  Problem Relation Age of Onset  . Kidney disease Mother   . Heart attack Father   . Cancer Sister   . Cancer Brother   . Cancer Sister     Social History Social History  Tobacco Use  . Smoking status: Former Games developer  . Smokeless tobacco: Never Used  Substance Use Topics  . Alcohol use: Not Currently  . Drug use: No     Allergies   Fish-derived products and Penicillins   Review of Systems Review of Systems  Constitutional: Negative for fatigue and fever.  Respiratory: Negative for shortness of breath.   Cardiovascular: Negative for chest pain.  Gastrointestinal: Negative for nausea and vomiting.  Musculoskeletal: Negative for arthralgias and joint swelling.  Skin: Positive for color change and wound.  Neurological: Negative for dizziness, weakness, light-headedness, numbness and headaches.     Physical Exam Triage Vital Signs ED Triage Vitals  Enc  Vitals Group     BP 05/21/17 1641 (!) 141/72     Pulse Rate 05/21/17 1640 (!) 59     Resp 05/21/17 1640 14     Temp 05/21/17 1640 98.1 F (36.7 C)     Temp src --      SpO2 05/21/17 1640 96 %     Weight --      Height --      Head Circumference --      Peak Flow --      Pain Score 05/21/17 1641 3     Pain Loc --      Pain Edu? --      Excl. in GC? --    No data found.  Updated Vital Signs BP (!) 141/72   Pulse (!) 59   Temp 98.1 F (36.7 C)   Resp 14   SpO2 96%   Visual Acuity Right Eye Distance:   Left Eye Distance:   Bilateral Distance:    Right Eye Near:   Left Eye Near:    Bilateral Near:     Physical Exam  Constitutional: He appears well-developed and well-nourished.  HENT:  Head: Normocephalic and atraumatic.  Eyes: Conjunctivae are normal.  Neck: Neck supple.  Cardiovascular: Normal rate.  Pulmonary/Chest: Effort normal. No respiratory distress.  Musculoskeletal: He exhibits no edema.  Neurological: He is alert.  Skin: Skin is warm and dry.  2 cm slightly jagged laceration to posterior surface of left index finger. Bleeding controlled. No foriegn bodies observed.   Psychiatric: He has a normal mood and affect.  Nursing note and vitals reviewed.    UC Treatments / Results  Labs (all labs ordered are listed, but only abnormal results are displayed) Labs Reviewed - No data to display  EKG None Radiology No results found.  Procedures Laceration Repair Date/Time: 05/22/2017 9:54 AM Performed by: Lajuane Leatham, Junius Creamer, PA-C Authorized by: Sharlene Dory, DO   Consent:    Consent obtained:  Verbal   Consent given by:  Patient   Risks discussed:  Pain, poor cosmetic result and retained foreign body   Alternatives discussed:  No treatment Anesthesia (see MAR for exact dosages):    Anesthesia method:  Local infiltration   Local anesthetic:  Lidocaine 2% w/o epi Laceration details:    Location:  Finger   Finger location:  L index finger    Length (cm):  2   Depth (mm):  5 Repair type:    Repair type:  Simple Pre-procedure details:    Preparation:  Patient was prepped and draped in usual sterile fashion Exploration:    Hemostasis achieved with:  Direct pressure   Wound exploration: wound explored through full range of motion     Wound extent: no foreign bodies/material noted, no muscle damage noted, no tendon  damage noted and no underlying fracture noted     Contaminated: no   Treatment:    Area cleansed with:  Soap and water   Amount of cleaning:  Standard   Irrigation solution:  Sterile water   Irrigation method:  Pressure wash and tap   Visualized foreign bodies/material removed: no   Skin repair:    Repair method:  Sutures   Suture size:  4-0   Suture material:  Prolene   Suture technique:  Simple interrupted   Number of sutures:  6 Approximation:    Approximation:  Close Post-procedure details:    Dressing:  Open (no dressing)   Patient tolerance of procedure:  Tolerated well, no immediate complications   (including critical care time)  Medications Ordered in UC Medications - No data to display   Initial Impression / Assessment and Plan / UC Course  I have reviewed the triage vital signs and the nursing notes.  Pertinent labs & imaging results that were available during my care of the patient were reviewed by me and considered in my medical decision making (see chart for details).     6 sutures in place, tolerated well. Discussed wound care management. Return for signs of infection. Removal in 7-10 days.  Final Clinical Impressions(s) / UC Diagnoses   Final diagnoses:  Laceration of left index finger without foreign body without damage to nail, initial encounter    ED Discharge Orders    None       Controlled Substance Prescriptions [place] Controlled Substance Registry consulted? Not Applicable   Lew DawesWieters, Mykayla Brinton C, [place] JerseyPA-C 05/22/17 [phone]

## 2017-05-31 ENCOUNTER — Ambulatory Visit (HOSPITAL_COMMUNITY)
Admission: EM | Admit: 2017-05-31 | Discharge: 2017-05-31 | Disposition: A | Discharge status: Home / Self Care | Payer: Medicare Other

## 2017-05-31 DIAGNOSIS — Z4802 Encounter for removal of sutures: Secondary | ICD-10-CM

## 2017-05-31 DIAGNOSIS — S61211D Laceration without foreign body of left index finger without damage to nail, subsequent encounter: Secondary | ICD-10-CM

## 2017-05-31 NOTE — ED Notes (Signed)
Pt here for 6 sutures removed from finger. Wound well healed and approximated. Pt has no further questions. Sutures removed, no bleeding. Pt advised return precautions for signs of infection

## 2018-09-01 ENCOUNTER — Other Ambulatory Visit: Payer: Self-pay

## 2020-12-31 ENCOUNTER — Emergency Department (HOSPITAL_COMMUNITY): Payer: No Typology Code available for payment source

## 2020-12-31 ENCOUNTER — Inpatient Hospital Stay (HOSPITAL_COMMUNITY)
Admission: EM | Admit: 2020-12-31 | Discharge: 2021-01-04 | DRG: 023 | Disposition: A | Discharge status: Home / Self Care | Payer: No Typology Code available for payment source | Attending: Internal Medicine | Admitting: Internal Medicine

## 2020-12-31 ENCOUNTER — Other Ambulatory Visit: Payer: Self-pay

## 2020-12-31 DIAGNOSIS — Z7982 Long term (current) use of aspirin: Secondary | ICD-10-CM

## 2020-12-31 DIAGNOSIS — I639 Cerebral infarction, unspecified: Secondary | ICD-10-CM | POA: Diagnosis not present

## 2020-12-31 DIAGNOSIS — I358 Other nonrheumatic aortic valve disorders: Secondary | ICD-10-CM | POA: Diagnosis present

## 2020-12-31 DIAGNOSIS — Z79899 Other long term (current) drug therapy: Secondary | ICD-10-CM

## 2020-12-31 DIAGNOSIS — Z7902 Long term (current) use of antithrombotics/antiplatelets: Secondary | ICD-10-CM

## 2020-12-31 DIAGNOSIS — R471 Dysarthria and anarthria: Secondary | ICD-10-CM | POA: Diagnosis present

## 2020-12-31 DIAGNOSIS — I6529 Occlusion and stenosis of unspecified carotid artery: Secondary | ICD-10-CM

## 2020-12-31 DIAGNOSIS — G936 Cerebral edema: Secondary | ICD-10-CM | POA: Diagnosis present

## 2020-12-31 DIAGNOSIS — K219 Gastro-esophageal reflux disease without esophagitis: Secondary | ICD-10-CM | POA: Diagnosis present

## 2020-12-31 DIAGNOSIS — Z66 Do not resuscitate: Secondary | ICD-10-CM | POA: Diagnosis present

## 2020-12-31 DIAGNOSIS — F419 Anxiety disorder, unspecified: Secondary | ICD-10-CM | POA: Diagnosis not present

## 2020-12-31 DIAGNOSIS — Z8249 Family history of ischemic heart disease and other diseases of the circulatory system: Secondary | ICD-10-CM

## 2020-12-31 DIAGNOSIS — I6329 Cerebral infarction due to unspecified occlusion or stenosis of other precerebral arteries: Secondary | ICD-10-CM | POA: Diagnosis not present

## 2020-12-31 DIAGNOSIS — Z91013 Allergy to seafood: Secondary | ICD-10-CM

## 2020-12-31 DIAGNOSIS — Z20822 Contact with and (suspected) exposure to covid-19: Secondary | ICD-10-CM | POA: Diagnosis present

## 2020-12-31 DIAGNOSIS — R29703 NIHSS score 3: Secondary | ICD-10-CM | POA: Diagnosis present

## 2020-12-31 DIAGNOSIS — F32A Depression, unspecified: Secondary | ICD-10-CM | POA: Diagnosis present

## 2020-12-31 DIAGNOSIS — N400 Enlarged prostate without lower urinary tract symptoms: Secondary | ICD-10-CM | POA: Diagnosis not present

## 2020-12-31 DIAGNOSIS — I6322 Cerebral infarction due to unspecified occlusion or stenosis of basilar arteries: Secondary | ICD-10-CM

## 2020-12-31 DIAGNOSIS — I69354 Hemiplegia and hemiparesis following cerebral infarction affecting left non-dominant side: Secondary | ICD-10-CM

## 2020-12-31 DIAGNOSIS — I10 Essential (primary) hypertension: Secondary | ICD-10-CM

## 2020-12-31 DIAGNOSIS — I672 Cerebral atherosclerosis: Secondary | ICD-10-CM | POA: Diagnosis present

## 2020-12-31 DIAGNOSIS — Z87891 Personal history of nicotine dependence: Secondary | ICD-10-CM

## 2020-12-31 DIAGNOSIS — H532 Diplopia: Secondary | ICD-10-CM | POA: Diagnosis present

## 2020-12-31 DIAGNOSIS — Z880 Allergy status to penicillin: Secondary | ICD-10-CM

## 2020-12-31 DIAGNOSIS — E7800 Pure hypercholesterolemia, unspecified: Secondary | ICD-10-CM | POA: Diagnosis present

## 2020-12-31 DIAGNOSIS — Z981 Arthrodesis status: Secondary | ICD-10-CM

## 2020-12-31 DIAGNOSIS — R262 Difficulty in walking, not elsewhere classified: Secondary | ICD-10-CM | POA: Diagnosis present

## 2020-12-31 DIAGNOSIS — F411 Generalized anxiety disorder: Secondary | ICD-10-CM | POA: Diagnosis present

## 2020-12-31 LAB — DIFFERENTIAL
Abs Immature Granulocytes: 0.01 10*3/uL (ref 0.00–0.07)
Basophils Absolute: 0 10*3/uL (ref 0.0–0.1)
Basophils Relative: 0 %
Eosinophils Absolute: 0.1 10*3/uL (ref 0.0–0.5)
Eosinophils Relative: 1 %
Immature Granulocytes: 0 %
Lymphocytes Relative: 19 %
Lymphs Abs: 0.8 10*3/uL (ref 0.7–4.0)
Monocytes Absolute: 0.3 10*3/uL (ref 0.1–1.0)
Monocytes Relative: 6 %
Neutro Abs: 3 10*3/uL (ref 1.7–7.7)
Neutrophils Relative %: 74 %

## 2020-12-31 LAB — CBC
HCT: 45.3 % (ref 39.0–52.0)
Hemoglobin: 15.4 g/dL (ref 13.0–17.0)
MCH: 28.3 pg (ref 26.0–34.0)
MCHC: 34 g/dL (ref 30.0–36.0)
MCV: 83.1 fL (ref 80.0–100.0)
Platelets: 83 10*3/uL — ABNORMAL LOW (ref 150–400)
RBC: 5.45 MIL/uL (ref 4.22–5.81)
RDW: 13.4 % (ref 11.5–15.5)
WBC: 4.1 10*3/uL (ref 4.0–10.5)
nRBC: 0 % (ref 0.0–0.2)

## 2020-12-31 LAB — COMPREHENSIVE METABOLIC PANEL
ALT: 20 U/L (ref 0–44)
AST: 22 U/L (ref 15–41)
Albumin: 4.3 g/dL (ref 3.5–5.0)
Alkaline Phosphatase: 50 U/L (ref 38–126)
Anion gap: 8 (ref 5–15)
BUN: 17 mg/dL (ref 8–23)
CO2: 23 mmol/L (ref 22–32)
Calcium: 9.2 mg/dL (ref 8.9–10.3)
Chloride: 107 mmol/L (ref 98–111)
Creatinine, Ser: 1.33 mg/dL — ABNORMAL HIGH (ref 0.61–1.24)
GFR, Estimated: 55 mL/min — ABNORMAL LOW (ref >60)
Glucose, Bld: 121 mg/dL — ABNORMAL HIGH (ref 70–99)
Potassium: 3.9 mmol/L (ref 3.5–5.1)
Sodium: 138 mmol/L (ref 135–145)
Total Bilirubin: 1 mg/dL (ref 0.3–1.2)
Total Protein: 6.6 g/dL (ref 6.5–8.1)

## 2020-12-31 LAB — I-STAT CHEM 8, ED
BUN: 18 mg/dL (ref 8–23)
Calcium, Ion: 1.18 mmol/L (ref 1.15–1.40)
Chloride: 105 mmol/L (ref 98–111)
Creatinine, Ser: 1.3 mg/dL — ABNORMAL HIGH (ref 0.61–1.24)
Glucose, Bld: 119 mg/dL — ABNORMAL HIGH (ref 70–99)
HCT: 44 % (ref 39.0–52.0)
Hemoglobin: 15 g/dL (ref 13.0–17.0)
Potassium: 3.9 mmol/L (ref 3.5–5.1)
Sodium: 141 mmol/L (ref 135–145)
TCO2: 23 mmol/L (ref 22–32)

## 2020-12-31 LAB — PROTIME-INR
INR: 1 (ref 0.8–1.2)
Prothrombin Time: 13.4 seconds (ref 11.4–15.2)

## 2020-12-31 LAB — APTT: aPTT: 27 seconds (ref 24–36)

## 2020-12-31 LAB — CBG MONITORING, ED: Glucose-Capillary: 127 mg/dL — ABNORMAL HIGH (ref 70–99)

## 2020-12-31 MED ORDER — LACTATED RINGERS IV BOLUS
1000.0000 mL | Freq: Once | INTRAVENOUS | Status: AC
  Administered 2020-12-31: 1000 mL via INTRAVENOUS

## 2020-12-31 MED ORDER — LORAZEPAM 2 MG/ML IJ SOLN
1.0000 mg | Freq: Once | INTRAMUSCULAR | Status: AC | PRN
  Administered 2020-12-31: 1 mg via INTRAVENOUS
  Filled 2020-12-31: qty 1

## 2020-12-31 MED ORDER — STROKE: EARLY STAGES OF RECOVERY BOOK: Freq: Once | Status: DC

## 2020-12-31 MED ORDER — ACETAMINOPHEN 325 MG PO TABS
650.0000 mg | ORAL_TABLET | Freq: Four times a day (QID) | ORAL | Status: DC | PRN
  Administered 2021-01-04: 08:00:00 650 mg via ORAL
  Filled 2020-12-31 (×2): qty 2

## 2020-12-31 MED ORDER — CLOPIDOGREL BISULFATE 75 MG PO TABS: 300.0000 mg | ORAL_TABLET | Freq: Once | ORAL | Status: DC

## 2020-12-31 MED ORDER — LORAZEPAM 2 MG/ML IJ SOLN: 0.5000 mg | Freq: Once | INTRAMUSCULAR | Status: DC | PRN

## 2020-12-31 MED ORDER — SODIUM CHLORIDE 0.9% FLUSH
3.0000 mL | Freq: Once | INTRAVENOUS | Status: DC
Start: 2020-12-31 — End: 2021-01-04

## 2020-12-31 MED ORDER — ACETAMINOPHEN 650 MG RE SUPP: 650.0000 mg | Freq: Four times a day (QID) | RECTAL | Status: DC | PRN

## 2020-12-31 MED ORDER — LORAZEPAM 1 MG PO TABS: 0.5000 mg | ORAL_TABLET | Freq: Once | ORAL | Status: DC | PRN

## 2020-12-31 MED ORDER — IOHEXOL 350 MG/ML SOLN
75.0000 mL | Freq: Once | INTRAVENOUS | Status: AC | PRN
  Administered 2020-12-31: 75 mL via INTRAVENOUS

## 2020-12-31 MED ORDER — ASPIRIN 325 MG PO TABS
325.0000 mg | ORAL_TABLET | Freq: Every day | ORAL | Status: DC
  Administered 2021-01-01: 325 mg via ORAL
  Filled 2020-12-31: qty 1

## 2020-12-31 MED ORDER — CLOPIDOGREL BISULFATE 75 MG PO TABS
75.0000 mg | ORAL_TABLET | Freq: Every day | ORAL | Status: DC
  Administered 2021-01-01: 75 mg via ORAL
  Filled 2020-12-31: qty 1

## 2020-12-31 MED ORDER — ATORVASTATIN CALCIUM 80 MG PO TABS
80.0000 mg | ORAL_TABLET | Freq: Every day | ORAL | Status: DC
  Administered 2021-01-01 – 2021-01-04 (×4): 80 mg via ORAL
  Filled 2020-12-31 (×4): qty 1

## 2020-12-31 MED ORDER — ASPIRIN 325 MG PO TABS: 650.0000 mg | ORAL_TABLET | Freq: Once | ORAL | Status: DC

## 2020-12-31 NOTE — ED Provider Notes (Signed)
Emergency Medicine Provider Triage Evaluation Note  Kevin Santiago , a 80 y.o. male  was evaluated in triage.  Pt complains of numbness and weakness. States he went to bed at 2200 in his usual state of health and woke up at 0400 with burning in his bilateral eyes. Went to get out of bed and felt like he was "floating" and couldn't feel his legs. Sat back in the bed and lifted his arms to see if he had a stroke and his left arm fell due to weakness. Per EMS, patient was at work yesterday and coworkers noticed facial droop, aphasia, drooling; these have since resolved. Not anticoagulated.  Review of Systems  Positive: As above Negative: As above, no vision loss, vomiting  Physical Exam  BP (!) 151/79 (BP Location: Left Arm)   Pulse 60   Temp 97.9 F (36.6 C) (Oral)   Resp 17   SpO2 95%  Gen:   Awake, no distress   Resp:  Normal effort  MSK:   Moves extremities without difficulty  Other:  GCS 15. Speech is goal oriented. No cranial nerve deficits appreciated. Patient has equal grip strength bilaterally with 5/5 strength against resistance in all major muscle groups bilaterally. Sensation to light touch intact. Patient moves extremities without ataxia. No dysmetria to finger-nose-finger.    Medical Decision Making  Medically screening exam initiated at 6:46 AM.  Appropriate orders placed.  [name] was informed that the remainder of the evaluation will be completed by another provider, this initial triage assessment does not replace that evaluation, and the importance of remaining in the ED until their evaluation is complete.  Numbness and extremity weakness   Antony Madura, PA-C 12/31/20 0321    Nira Conn, MD 12/31/20 [phone]

## 2020-12-31 NOTE — Consult Note (Signed)
Neurology Consultation  Reason for Consult: Stroke Referring Physician: Dr. Rodena Medin  CC: Visual disturbance, balance issues while walking, left-sided numbness and weakness which is intermittent  History is obtained from: Patient, chart  HPI: Kevin Santiago is a 80 y.o. male with history of hypertension, hypercholesterolemia, anxiety and depression presented to the emergency room for evaluation of couple of symptoms that started suddenly Monday night around 8 or 9 PM. Reports he was in his usual state of health Monday, 12/29/2020 up until around 8 or 9 PM when he had sudden onset of what he describes as "squiggly lines" in his right eye.  He also felt that he was mildly imbalanced while he was try to walk.  His symptoms persisted.  He also noticed left arm and leg tingling that was off and on and has been since then coming off and on without any forewarning.  At the time of this encounter, he did not have any tingling or numbness.  He also did not have any squiggly lines or double vision.  He described the double vision as seeing images nearly separate out when he is trying to look left and the double vision sensation only affected his right eye. Denies any chest pain shortness of breath.  Denies any fevers chills.  Denies nausea vomiting.  Reports difficulty ambulating due to being imbalance and having unsteady gait. Reports that his speech also might be slightly slurred but that also gets better and then worsens at times.  At the time of the encounter, had mild dysarthria.    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   Past Medical History:  Diagnosis Date   Anxiety    Depression    Hypercholesteremia    Hypertension     Family History  Problem Relation Age of Onset   Kidney disease Mother    Heart attack Father    Cancer Sister     Cancer Brother    Cancer Sister     Social History:   reports that he has quit smoking. He has never used smokeless tobacco. He reports that he does not currently use alcohol. He reports that he does not use drugs.  Medications  Current Facility-Administered Medications:    sodium chloride flush (NS) 0.9 % injection 3 mL, 3 mL, Intravenous, Once, Gloris Manchester, MD  Current Outpatient Medications:    cholecalciferol (VITAMIN D) 1000 UNITS tablet, Take 2,000 Units by mouth daily., Disp: , Rfl:    fluorouracil (EFUDEX) 5 % cream, Apply 1 application topically daily as needed. Skin basil cell areas as directed, Disp: , Rfl:    hydrocortisone (ANUSOL-HC) 2.5 % rectal cream, Place 1 application rectally 2 (two) times daily. (Patient taking differently: Place 1 application rectally 2 (two) times daily as needed for itching.), Disp: 30 g, Rfl: 0   hydroxypropyl methylcellulose (ISOPTO TEARS) 2.5 % ophthalmic solution, Place 2 drops into both eyes 2 (two) times daily as needed for dry eyes., Disp: , Rfl:    losartan (COZAAR) 50 MG tablet, Take 25 mg by mouth daily., Disp: , Rfl:    omeprazole (PRILOSEC) 40 MG capsule, Take 40 mg by mouth daily as needed (ACID REFLUX)., Disp: , Rfl:    ondansetron (ZOFRAN ODT) 4 MG disintegrating tablet, Take 1 tablet (4 mg total) by mouth every 6 (six) hours as needed for nausea  or vomiting., Disp: 20 tablet, Rfl: 0   pravastatin (PRAVACHOL) 40 MG tablet, Take 40 mg by mouth at bedtime., Disp: , Rfl:    sertraline (ZOLOFT) 25 MG tablet, Take 25 mg by mouth at bedtime., Disp: , Rfl:    tamsulosin (FLOMAX) 0.4 MG CAPS capsule, Take 0.4 mg by mouth at bedtime., Disp: , Rfl:    ferrous sulfate 325 (65 FE) MG tablet, Take 325 mg by mouth every other day., Disp: , Rfl:   Exam: Current vital signs: BP (!) 157/87 (BP Location: Left Arm)   Pulse 64   Temp 97.9 F (36.6 C) (Oral)   Resp 19   SpO2 99%  Vital signs in last 24 hours: Temp:  [97.9 F (36.6 C)] 97.9 F  (36.6 C) (11/30 0640) Pulse Rate:  [52-84] 64 (11/30 1917) Resp:  [16-24] 19 (11/30 1917) BP: (134-176)/(79-87) 157/87 (11/30 1917) SpO2:  [95 %-100 %] 99 % (11/30 1917) General: Awake alert in no distress HEENT: - Normocephalic and atraumatic, dry mm, no LN++, no Thyromegally LUNGS - Clear to auscultation bilaterally with no wheezes CV - S1S2 RRR, no m/r/g, equal pulses bilaterally. ABDOMEN - Soft, nontender, nondistended with normoactive BS Ext: warm, well perfused, intact peripheral pulses, no edema  NEURO:  Mental Status: AA&Ox3  Language: speech is clear and but he has mild dysarthria..  Naming, repetition, fluency, and comprehension intact. 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 No evidence of tongue atrophy or fibrillations Motor: No drift noted in any of the 4 extremities although it did feel like that his right upper extremity was mildly weaker 4+/5 when compared to the left. Tone: is normal and bulk is normal Sensation- Intact to light touch bilaterally Coordination: He had mild dysmetria in all 4 extremities while performing finger-nose-finger and heel-knee-shin testing which was worse on the right than on left. Gait- deferred  NIHSS 1a Level of Conscious.: 0 1b LOC Questions: 0 1c LOC Commands: 0 2 Best Gaze: 0 3 Visual: 0 4 Facial Palsy: 0 5a Motor Arm - left: 0 5b Motor Arm - Right: 0 6a Motor Leg - Left: 0 6b Motor Leg - Right: 0 7 Limb Ataxia: 2 8 Sensory: 0 9 Best Language: 0 10 Dysarthria: 1 11 Extinct. and Inatten.: 0 TOTAL: 3  Labs I have reviewed labs in epic and the results pertinent to this consultation are:  CBC    Component Value Date/Time   WBC 4.1 12/31/2020 0715   RBC 5.45  12/31/2020 0715   HGB 15.0 12/31/2020 0730   HCT 44.0 12/31/2020 0730   PLT 83 (L) 12/31/2020 0715   MCV 83.1 12/31/2020 0715   MCH 28.3 12/31/2020 0715   MCHC 34.0 12/31/2020 0715   RDW 13.4 12/31/2020 0715   LYMPHSABS 0.8 12/31/2020 0715   MONOABS 0.3 12/31/2020 0715   EOSABS 0.1 12/31/2020 0715   BASOSABS 0.0 12/31/2020 0715    CMP     Component Value Date/Time   NA 141 12/31/2020 0730   K 3.9 12/31/2020 0730   CL 105 12/31/2020 0730   CO2 23 12/31/2020 0715   GLUCOSE 119 (H) 12/31/2020 0730   BUN 18 12/31/2020 0730   CREATININE 1.30 (H) 12/31/2020 0730   CALCIUM 9.2 12/31/2020 0715   PROT 6.6 12/31/2020 0715   ALBUMIN  4.3 12/31/2020 0715   AST 22 12/31/2020 0715   ALT 20 12/31/2020 0715   ALKPHOS 50 12/31/2020 0715   BILITOT 1.0 12/31/2020 0715   GFRNONAA 55 (L) 12/31/2020 0715   GFRAA >60 07/03/2014 0708    Imaging I have reviewed the images obtained:  CT-head IMPRESSION: No acute intracranial hemorrhage or evidence of acute infarction. CT angiography head and neck IMPRESSION: 1. Negative for intracranial large vessel occlusion. 2. 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 3. Diffusely disease left vertebral artery which is small and occluded in the mid segment. Faint reconstitution distally. 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 Moderate stenosis basilar. 4. Bilateral thyroid nodules. Largest nodule on the left 2.6 cm. Recommend thyroid ultrasound. (Ref: J Am Coll Radiol. 2015 Feb;12(2): 143-50).  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 Tiny acute infarct in the right posterior pons. 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 Also reports of intermittent double vision ever since the last known well of Monday night. CT angiography head and neck shows atherosclerotic disease in the carotid bifurcation bilaterally without significant stenosis and diffuse disease in the left vertebral artery which is small and occluded in the midsegment with faint reconstitution distally.  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 His stroke etiology is likely secondary to the diseased posterior circulation. No emergent large vessel occlusion.   Recommendations: -Admit to hospitalist -Frequent neurochecks -Telemetry -Load with aspirin 650 and Plavix 300 -Continue aspirin 325 and Plavix 75 daily from tomorrow -High intensity statin-atorvastatin 80 mg now and daily -Will probably require discussion with neuro interventional radiology on a nonemergent basis tomorrow morning for the moderate basilar stenosis and multiple vertebral artery stenosis.  No emergent intervention given acute stroke outside the window for acute intervention. -Allow for permissive hypertension-allow for blood pressures to be as high as 220 and treat only on a as needed basis if higher than 220. -2D echo -A1c -Lipid panel -PT -OT -Speech therapy  Stroke team will follow with you  Plan discussed with Dr. Francia Greaves, ED provider  -- Amie Portland, MD Neurologist [facility] Pager: [phone]

## 2020-12-31 NOTE — ED Notes (Signed)
MD aware pt failed swallow, status NPO. PO meds held per order

## 2020-12-31 NOTE — ED Provider Notes (Signed)
Patient seen after prior EDP.  Patient's MR demonstrates acute infarct.  Neurology is aware of case and will consult  Medicine service is aware of case and will evaluate for admission.   Wynetta Fines, MD 12/31/20 [phone]

## 2020-12-31 NOTE — ED Triage Notes (Signed)
BIB [facility] after pt called to report increase leftsided numbness, weakness, and burring eyes. Per EMS, pt was at work mid day yesterday, and coworkers noticed facial droop, aphasia, drooling. Pt ate, symptoms since resolved.  PMH: HTN, depression,

## 2020-12-31 NOTE — ED Notes (Signed)
Paged MD to make aware of failed swallow

## 2020-12-31 NOTE — H&P (Signed)
History and Physical    PLEASE NOTE THAT DRAGON DICTATION SOFTWARE WAS USED IN THE CONSTRUCTION OF THIS NOTE.   Kevin Santiago NFA:[id number] DOB: April 25, 1940 DOA: 12/31/2020  PCP: Patient, No Pcp Per (Inactive) (will further assess) Patient coming from: home   I have personally briefly reviewed patient's old medical records in [facility]  Chief Complaint: left-sided numbness  HPI: Kevin Santiago is a 80 y.o. male with medical history significant for hypertension, hyperlipidemia who is admitted to [facility] on 12/31/2020 with acute ischemic strokeafter presenting from home to [facility] ED complaining of  left-sided numbness.   The patient reports sudden onset of double vision limited to the right eye and associated slurred speech starting at 1730 on Monday, 12/29/2020.  He reports that the symptoms lasted approximately 1 hour, before completely resolving without subsequent recurrence.  He notes that he was asymptomatic throughout the day on 12/30/2020, and went to bed in this capacity at 2200 on 12/30/2020.  Subsequently awoke at 4 AM on 12/31/2020 with new onset numbness involving the left upper extremity as well as the left lower extremity.  He also reported new onset symmetrical weakness involving the bilateral lower extremities, in the absence of any weakness involving the upper extremities.  The symptoms lasted approximately 1 hour, before complete resolution, without subsequent recurrence.  Denies any associated dysphagia, vertigo, nausea, vomiting, facial droop, or headache.  He also denies any recent chest pain, shortness of breath, palpitations, diaphoresis, presyncope, or syncope.  Denies any previous history of stroke. Medical/social history notable for essential hypertension hyperlipidemia.  He is a former smoker, having completely quit smoking in the 1970s after smoking less than 1 cigarette/day for approximately 10 years.  Denies any known history of diabetes, atrial  fibrillation, or obstructive sleep apnea.  Reports good compliance with outpatient losartan as well as pravastatin.  Not on any antiplatelet or anticoagulation medications as an outpatient.      ED Course:  Vital signs in the ED were notable for the following:  Afebrile, heart rate 57-64; blood pressure 121/67-151/87, respiratory rate 16-19; oxygen saturation 96 100% on room air.  Labs were notable for the following: CMP notable for the following: Sodium 138, glucose 121.  CBC notable for 4100, hemoglobin 15.4.  INR 1.0.  Screening COVID-19/influenza PCR were checked in the emergency department this evening, with results currently pending.  Imaging and additional notable ED work-up: EKG showed sinus rhythm with heart rate 61, normal intervals, and no evidence of T wave or ST changes, including no evidence of ST elevation.  Noncontrast CT head showed no evidence of acute intracranial process, including no evidence of intracranial hemorrhage.  CTA head showed no evidence of large vessel occlusion.  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  MRI brain showed acute infarct in the left pons as well as tiny acute infarct in the right posterior pons.  EDP discussed the patient's case and imaging with the on-call neurologist, Dr. Wilford Corner, recommended admission to the hospitalist service for further stroke work-up.  Neurology to formally consult, with preliminary recommendations for dual antiplatelet therapy, including Plavix load this evening followed by initiation of Plavix 75 mg p.o. daily starting tomorrow.   While in the ED, the following were  administered: Lactated Ringer's x1 L bolus; Ativan 1 mg  IV x1 (leading up to the above imaging).     Review of Systems: As per HPI otherwise 10 point review of systems negative.   Past Medical History:  Diagnosis Date   Anxiety    Depression    Hypercholesteremia    Hypertension     Past Surgical History:  Procedure Laterality Date   cervical fusion     SHOULDER SURGERY Left 1990   WRIST FRACTURE SURGERY      Social History:  reports that he has quit smoking. He has never used smokeless tobacco. He reports that he does not currently use alcohol. He reports that he does not use drugs.   Allergies  Allergen Reactions   Fish-Derived Products Rash    Fish sticks   Penicillins Itching and Rash    Family History  Problem Relation Age of Onset   Kidney disease Mother    Heart attack Father    Cancer Sister    Cancer Brother    Cancer Sister     Family history reviewed and not pertinent    Prior to Admission medications   Medication Sig Start Date End Date Taking? Authorizing Provider  cholecalciferol (VITAMIN D) 1000 UNITS tablet Take 2,000 Units by mouth daily.   Yes [provider]  fluorouracil (EFUDEX) 5 % cream Apply 1 application topically daily as needed. Skin basil cell areas as directed 07/18/20  Yes [provider]  hydrocortisone (ANUSOL-HC) 2.5 % rectal cream Place 1 application rectally 2 (two) times daily. Patient taking differently: Place 1 application rectally 2 (two) times daily as needed for itching. 06/30/13  Yes Young, Dillard Cannon, PA-C  hydroxypropyl methylcellulose (ISOPTO TEARS) 2.5 % ophthalmic solution Place 2 drops into both eyes 2 (two) times daily as needed for dry eyes.   Yes [provider]  losartan (COZAAR) 50 MG tablet Take 25 mg by mouth daily. 02/29/20  Yes [provider]  omeprazole (PRILOSEC) 40 MG capsule Take 40 mg by mouth daily as needed (ACID REFLUX).   Yes [provider]  ondansetron (ZOFRAN ODT) 4 MG disintegrating tablet Take 1 tablet (4  mg total) by mouth every 6 (six) hours as needed for nausea or vomiting. 06/26/13  Yes Pricilla Loveless, MD  pravastatin (PRAVACHOL) 40 MG tablet Take 40 mg by mouth at bedtime.   Yes [provider]  sertraline (ZOLOFT) 25 MG tablet Take 25 mg by mouth at bedtime.   Yes [provider]  tamsulosin (FLOMAX) 0.4 MG CAPS capsule Take 0.4 mg by mouth at bedtime.   Yes [provider]  ferrous sulfate 325 (65 FE) MG tablet Take 325 mg by mouth every other day. 02/29/20   [provider]     Objective    Physical Exam: Vitals:   12/31/20 1500 12/31/20 1700 12/31/20 1715 12/31/20 1917  BP: (!) 145/79 (!) 152/82 (!) 144/84 (!) 157/87  Pulse: (!) 52 (!) 56 (!) 59 64  Resp: 19 (!) 23 (!) 24 19  Temp:      TempSrc:      SpO2: 96% 97% 96% 99%    General: appears to be stated age; alert, oriented Skin: warm, dry, no rash Head:  AT/[place] Mouth:  Oral mucosa membranes appear moist, normal dentition Neck: supple; trachea midline Heart:  RRR; did not appreciate any M/R/G Lungs: CTAB, did not appreciate any wheezes, rales, or rhonchi Abdomen: + BS; soft, ND, NT Vascular: 2+ pedal pulses  b/l; 2+ radial pulses b/l Extremities: no peripheral edema, no muscle wasting Neuro: Neuro: 5/5 strength of the proximal and distal flexors and extensors of the upper and lower extremities bilaterally; sensation intact in upper and lower extremities b/l; cranial nerves II through XII grossly intact; no pronator drift; no evidence suggestive of slurred speech, dysarthria, or facial droop; Normal muscle tone. No tremors.    Labs on Admission: I have personally reviewed following labs and imaging studies  CBC: Recent Labs  Lab 12/31/20 0715 12/31/20 0730  WBC 4.1  --   NEUTROABS 3.0  --   HGB 15.4 15.0  HCT 45.3 44.0  MCV 83.1  --   PLT 83*  --    Basic Metabolic Panel: Recent Labs  Lab 12/31/20 0715 12/31/20 0730  NA 138 141  K 3.9 3.9  CL 107 105  CO2 23  --    GLUCOSE 121* 119*  BUN 17 18  CREATININE 1.33* 1.30*  CALCIUM 9.2  --    GFR: CrCl cannot be calculated (Unknown ideal weight.). Liver Function Tests: Recent Labs  Lab 12/31/20 0715  AST 22  ALT 20  ALKPHOS 50  BILITOT 1.0  PROT 6.6  ALBUMIN 4.3   No results for input(s): LIPASE, AMYLASE in the last 168 hours. No results for input(s): AMMONIA in the last 168 hours. Coagulation Profile: Recent Labs  Lab 12/31/20 0715  INR 1.0   Cardiac Enzymes: No results for input(s): CKTOTAL, CKMB, CKMBINDEX, TROPONINI in the last 168 hours. BNP (last 3 results) No results for input(s): PROBNP in the last 8760 hours. HbA1C: No results for input(s): HGBA1C in the last 72 hours. CBG: Recent Labs  Lab 12/31/20 0723  GLUCAP 127*   Lipid Profile: No results for input(s): CHOL, HDL, LDLCALC, TRIG, CHOLHDL, LDLDIRECT in the last 72 hours. Thyroid Function Tests: No results for input(s): TSH, T4TOTAL, FREET4, T3FREE, THYROIDAB in the last 72 hours. Anemia Panel: No results for input(s): VITAMINB12, FOLATE, FERRITIN, TIBC, IRON, RETICCTPCT in the last 72 hours. Urine analysis:    Component Value Date/Time   COLORURINE ORANGE (A) 07/01/2014 1057   APPEARANCEUR CLEAR 07/01/2014 1057   LABSPEC 1.028 07/01/2014 1057   PHURINE 5.0 07/01/2014 1057   GLUCOSEU NEGATIVE 07/01/2014 1057   HGBUR NEGATIVE 07/01/2014 1057   BILIRUBINUR SMALL (A) 07/01/2014 1057   KETONESUR 15 (A) 07/01/2014 1057   PROTEINUR 30 (A) 07/01/2014 1057   UROBILINOGEN 1.0 07/01/2014 1057   NITRITE NEGATIVE 07/01/2014 1057   LEUKOCYTESUR TRACE (A) 07/01/2014 1057    Radiological Exams on Admission: CT ANGIO HEAD NECK W WO CM  Result Date: 12/31/2020 CLINICAL DATA:  Stroke/TIA.  Left-sided numbness and weakness. EXAM: CT ANGIOGRAPHY HEAD AND NECK TECHNIQUE: Multidetector CT imaging of the head and neck was performed using the standard protocol during bolus administration of intravenous contrast. Multiplanar CT  image reconstructions and MIPs were obtained to evaluate the vascular anatomy. Carotid stenosis measurements (when applicable) are obtained utilizing NASCET criteria, using the distal internal carotid diameter as the denominator. CONTRAST:  75mL OMNIPAQUE IOHEXOL 350 MG/ML SOLN COMPARISON:  CT head 12/31/2020 FINDINGS: CTA NECK FINDINGS Aortic arch: Minimal atherosclerotic disease aortic arch. Proximal great vessels widely patent. Right carotid system: Mild atherosclerotic disease right carotid bifurcation without significant stenosis. Left carotid system: Mild atherosclerotic disease left carotid bifurcation without stenosis Vertebral arteries: Right vertebral artery dominant and widely patent Small left vertebral artery is occluded in the mid neck with faint reconstitution distally with very small vessel. Skeleton: ACDF  C4 through C6. No definite solid fusion at C4-5. Posterior cerclage wires at C3-4. Posterior screw and plate fusion C2 through C4. No acute skeletal abnormality. Other neck: Left lower pole thyroid nodule 2.6 cm. Right lower pole thyroid nodule 11.8 cm. Recommend thyroid ultrasound (ref: J Am Coll Radiol. 2015 Feb;12(2): 143-50). Upper chest: Lung apices clear bilaterally. Review of the MIP images confirms the above findings CTA HEAD FINDINGS Anterior circulation: Mild atherosclerotic disease in the cavernous carotid bilaterally without stenosis. Anterior and middle cerebral arteries widely patent bilaterally. 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 Right PICA is patent. Occlusion of the mid left vertebral artery with reconstitution distally. Left vertebral artery is small and diseased vessel which supplies left PICA and small contribution to the basilar. Atherosclerotic irregularity and moderate stenosis in the basilar artery. Posterior cerebral arteries patent bilaterally. Fetal origin right posterior cerebral artery. Venous sinuses: Normal  venous enhancement Anatomic variants: None Review of the MIP images confirms the above findings IMPRESSION: 1. Negative for intracranial large vessel occlusion. 2. 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 3. Diffusely disease left vertebral artery which is small and occluded in the mid segment. Faint reconstitution distally. 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 Moderate stenosis basilar. 4. Bilateral thyroid nodules. Largest nodule on the left 2.6 cm. Recommend thyroid ultrasound. (Ref: J Am Coll Radiol. 2015 Feb;12(2): 143-50). Electronically Signed   By: Marlan Palau M.D.   On: 12/31/2020 18:09   CT HEAD WO CONTRAST  Result Date: 12/31/2020 CLINICAL DATA:  Neuro deficit, acute, stroke suspected EXAM: CT HEAD WITHOUT CONTRAST TECHNIQUE: Contiguous axial images were obtained from the base of the skull through the vertex without intravenous contrast. COMPARISON:  2015 FINDINGS: Brain: There is no acute intracranial hemorrhage, mass effect, or edema. Gray-white differentiation is preserved. There is no extra-axial fluid collection. Ventricles and sulci are within normal limits in size and configuration. Vascular: There is atherosclerotic calcification at the skull base. Skull: Calvarium is unremarkable. Sinuses/Orbits: Paranasal sinus mucosal thickening. Orbits are unremarkable. Other: None. IMPRESSION: No acute intracranial hemorrhage or evidence of acute infarction. Electronically Signed   By: Guadlupe Spanish M.D.   On: 12/31/2020 08:15   MR BRAIN WO CONTRAST  Result Date: 12/31/2020 CLINICAL DATA:  Stroke/TIA.  Left-sided numbness and weakness. EXAM: MRI HEAD WITHOUT CONTRAST TECHNIQUE: Multiplanar, multiecho pulse sequences of the brain and surrounding structures were obtained without intravenous contrast. COMPARISON:  CT head 12/31/2020 FINDINGS: Brain: Acute infarct in the left  pons. Small area of acute infarct in the right posterior pons in the floor of the fourth ventricle. Ventricle size and cerebral volume normal. Negative for hemorrhage or mass. Normal white matter. Vascular: Normal arterial flow voids Skull and upper cervical spine: Negative Sinuses/Orbits: Mucosal edema paranasal sinuses.  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 Tiny acute infarct in the right posterior pons. No significant chronic ischemia Electronically Signed   By: Marlan Palau M.D.   On: 12/31/2020 19:14     EKG: Independently reviewed, with result as described above.    Assessment/Plan   Principal Problem:   CVA (cerebral vascular accident) [facility]) Active Problems:   Hypertension   Hypercholesteremia   Anxiety   BPH (benign prostatic hyperplasia)   GERD (gastroesophageal reflux disease)     #) Acute ischemic CVA: In the setting of right-sided blurry vision/dysarthria starting on Monday, 12/29/2020 at 1730  completely resolving within 1 hour followed by awaking on the morning of 12/31/2020 with new onset left-sided numbness after going to bed asymptomatic at 2200 on 12/30/2020, followed by ensuing resolution within 1 hour, with MRI brain showing acute infarct in the left pons as well as tiny acute infarct in the right posterior pons.  This is a CT head showed no acute process, including no evidence of intracranial hemorrhage, while CT head showed no evidence of large vessel occlusion, and CTA neck showed evidence of atherosclerotic disease, as further quantified above. of note, the patient also failed his nursing bedside swallow screen.   Case/imaging discussed with on-call neurologist, Dr. Wilford Corner, who recommended admission to the hospitalist service for further stroke work-up.  Neurology to formally consult, with preliminary recommendations for dual antiplatelet therapy, as above, with additional recs to follow. Additionally, neuro recommends escalation of  statin therapy from existing home pravastatin to high intensity atorvastatin.   Of note, the patient reportedly possesses multiple modifiable CVA risk factors including a history of htn and hld. He also has a distant history of tobacco abuse before completely quitting in the 1970s.  No known history of diabetes, OSA, paroxysmal atrial fibrillation.  EKG shows sinus rhythm.   Dr Wilford Corner confirms that patient is not a candidate for TPA administration given that presentation is outside of the window for administration of such. Current outpatient antiplatelet/anticoagulant regimen: none. He also appears to be outside the window for observance of permissive hypertension.      Plan: NPO, having failed nursing bedside swallow screen. PT/OT/ST consults placed. Head of the bed at 30 degrees. Neuro checks per protocol. VS per protocol. Monitor on telemetry, including monitoring for atrial fibrillation as modifiable risk factor for acute ischemic CVA.   TTE without bubble study ordered.  Check lipid panel and A1c. Neurology consulted, as above, with additional recs to follow.       #) Essential Hypertension: documented history of such, with outpatient antihypertensive regimen including losartan.  Systolic blood pressures pressure in the ED today 120's to 150's mmHg.    Plan: holding home losartan in the setting of n.p.o. status after failing nursing bedside swallow screen.  Close monitoring of subsequent blood pressure via routine vital signs.        #) Hyperlipidemia: documented h/o such. On pravastatin as outpatient.  In the setting of presenting acute ischemic stroke, with CTA neck revealing evidence of significant atherosclerotic disease, neurology, per ensuing consultation, recommend escalation from existing pravastatin to high intensity atorvastatin, which will be initiated following further evaluation by speech therapy given that patient failed nursing bedside swallow screen, as above.   Plan:  Start high intensity atorvastatin per neurology recommendation following ST consult, as above.       #) Generalized anxiety disorder: On Celexa as an outpatient.  Plan: Hold home Zoloft in the setting of current n.p.o. status, as above.       #) GERD: On omeprazole as an outpatient.  Plan: Hold home PPI for now in the setting of current n.p.o. status.       #) BPH: On tamsulosin as an outpatient.  Plan: Hold tamsulosin for now in setting of current n.p.o. status.     DVT prophylaxis: SCD's   Code Status: DNR (per my discussions with the patient this evening) Family Communication: none Disposition Plan: Per Rounding Team Consults called: Dr. Wilford Corner of neurology consulted, as further described above;  Admission status: Observation; med telemetry   PLEASE NOTE THAT DRAGON DICTATION SOFTWARE  WAS USED IN THE CONSTRUCTION OF THIS NOTE.   Chaney Born Karlisa Gaubert DO [facility]  From 7PM - 7AM   12/31/2020, 8:01 PM

## 2020-12-31 NOTE — ED Notes (Signed)
Patient transported to MRI 

## 2020-12-31 NOTE — ED Provider Notes (Signed)
[facility] EMERGENCY DEPARTMENT Provider Note   CSN: [id number] Arrival date & time: 12/31/20  [id number]     History Chief Complaint  Patient presents with   weakness   Numbness    DAMIONE POSTEN is a 80 y.o. male.  HPI Patient presents for intermittent strokelike symptoms over the past 2 days.  Patient currently works as a [employer] scheme manager.  He was in his normal state of health up until yesterday afternoon.  Yesterday afternoon, while at work, he felt generalized fatigue.  His coworkers stated that he did not look well and noticed a facial droop and slurred speech.  Patient has no history of stroke.  He denies any known history of cardiac disease.  Currently, he does not take any blood thinning medication or any antiplatelet agents.  At the time of the symptoms yesterday afternoon, patient got something to eat and drink and states that he felt better.  He felt fine going to bed.  Early in the morning, he experienced burning sensation in his eyes, bilateral leg weakness and some paresthesias in his left arm.  For this reason, he presented to the ED.  While in the ED waiting room, he again had intermittent symptoms of left arm paresthesias and right eye burning sensation.  He denies any changes to his vision.  Currently, he denies any symptoms.    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 Authorizing Provider  aspirin EC 81 MG tablet Take 81 mg by mouth daily.    [provider]  bisacodyl (DULCOLAX) 5 MG EC tablet Take 1 tablet (5 mg total) by mouth daily as needed for moderate constipation. 07/05/14   Luan Moore, MD  Calcium-Magnesium-Vitamin D (CALCIUM 500 PO) Take 1 tablet by mouth daily.    [provider]  cholecalciferol (VITAMIN D) 1000 UNITS tablet Take 1,000 Units by mouth at bedtime.    [provider]  hydrocortisone (ANUSOL-HC) 2.5 % rectal cream Place 1 application rectally 2 (two) times daily. Patient taking differently: Place 1 application rectally 2 (two) times daily as needed for itching.  06/30/13   Bjorn Pippin, PA-C  hydroxypropyl methylcellulose (ISOPTO TEARS) 2.5 % ophthalmic solution Place 2 drops into both eyes 2 (two) times daily as needed for dry eyes.    [provider]  omeprazole (PRILOSEC) 40 MG capsule Take  40 mg by mouth daily as needed (ACID REFLUX).    [provider]  ondansetron (ZOFRAN ODT) 4 MG disintegrating tablet Take 1 tablet (4 mg total) by mouth every 6 (six) hours as needed for nausea or vomiting. 06/26/13   Pricilla Loveless, MD  pravastatin (PRAVACHOL) 20 MG tablet Take 20 mg by mouth at bedtime.    [provider]  PRESCRIPTION MEDICATION Pt gets infusion once a year for osteoporosis  (RECLAST?)  at the [facility] in [place] - Last dose was Feb 2016    [provider]  sertraline (ZOLOFT) 25 MG tablet Take 25 mg by mouth at bedtime.    [provider]  tamsulosin (FLOMAX) 0.4 MG CAPS capsule Take 0.4 mg by mouth at bedtime.    [provider]  UNKNOWN TO PATIENT HTN med    [provider]    Allergies    Fish-derived products and Penicillins  Review of Systems   Review of Systems  Constitutional:  Positive for fatigue. Negative for activity change, chills and fever.  HENT:  Negative for congestion, ear pain and sore throat.   Eyes:  Negative for photophobia, redness and visual disturbance. Eye pain: Burning sensation. Respiratory:  Negative for cough, chest tightness and shortness of breath.   Cardiovascular:  Negative for chest pain and palpitations.  Gastrointestinal:  Negative for abdominal pain, diarrhea, nausea and vomiting.  Genitourinary:  Negative for dysuria, flank pain and hematuria.  Musculoskeletal:  Negative for arthralgias and back pain.  Skin:  Negative for color change and rash.  Neurological:  Positive for dizziness, facial asymmetry (Per coworkers) and weakness. Negative for seizures, syncope and headaches. Speech difficulty: Slurred speech per coworkers. Hematological:  Does not bruise/bleed easily.  Psychiatric/Behavioral:  Negative for confusion and decreased concentration.   All other systems reviewed and are negative.  Physical Exam Updated Vital Signs BP (!) 145/79   Pulse (!) 52   Temp 97.9 F (36.6 C) (Oral)   Resp 19   SpO2 96%   Physical Exam Vitals and nursing note reviewed.  Constitutional:      General: He is not in acute distress.    Appearance: Normal appearance. He is well-developed and normal weight. He is not ill-appearing, toxic-appearing or diaphoretic.  HENT:     Head: Normocephalic and atraumatic.     Right Ear: External ear normal.     Left Ear: External ear normal.     Nose: Nose normal.     Mouth/Throat:     Mouth: Mucous membranes are moist.     Pharynx: Oropharynx is clear.  Eyes:     General: No visual field deficit or scleral icterus.    Extraocular Movements: Extraocular movements intact.     Conjunctiva/sclera: Conjunctivae normal.   Cardiovascular:     Rate and Rhythm: Normal rate and regular rhythm.     Heart sounds: No murmur heard. Pulmonary:     Effort: Pulmonary effort is normal. No respiratory distress.     Breath sounds: Normal breath sounds. No wheezing or rales.  Abdominal:     Palpations: Abdomen is soft.     Tenderness: There is no abdominal tenderness.  Musculoskeletal:        General: No swelling or tenderness.     Cervical back: Normal range of motion and neck supple. No rigidity.     Right lower leg: No edema.     Left lower leg: No edema.  Skin:    General: Skin is warm  and dry.     Capillary Refill: Capillary refill takes less than 2 seconds.     Coloration: Skin is not jaundiced or pale.  Neurological:     General: No focal deficit present.     Mental Status: He is alert and oriented to person, place, and time.     Cranial Nerves: Cranial nerves 2-12 are intact. No cranial nerve deficit, dysarthria or facial asymmetry.     Sensory: Sensation is intact. No sensory deficit.     Motor: No weakness, abnormal muscle tone or pronator drift.     Coordination: Coordination is intact. Coordination normal. Finger-Nose-Finger Test normal.  Psychiatric:        Mood and Affect: Mood normal.        Behavior: Behavior normal.        Thought Content: Thought content normal.        Judgment: Judgment normal.    ED Results / Procedures / Treatments   Labs (all labs ordered are listed, but only abnormal results are displayed) Labs Reviewed  CBC - Abnormal; Notable for the following components:      Result Value   Platelets 83 (*)    All other components within normal limits  COMPREHENSIVE METABOLIC PANEL - Abnormal; Notable for the following components:   Glucose, Bld 121 (*)    Creatinine, Ser 1.33 (*)    GFR, Estimated 55 (*)    All other components within normal limits  I-STAT CHEM 8, ED - Abnormal; Notable for the following components:   Creatinine, Ser 1.30 (*)    Glucose, Bld 119 (*)    All  other components within normal limits  CBG MONITORING, ED - Abnormal; Notable for the following components:   Glucose-Capillary 127 (*)    All other components within normal limits  RESP PANEL BY RT-PCR (FLU A&B, COVID) [id number]  PROTIME-INR  APTT  DIFFERENTIAL    EKG EKG Interpretation  Date/Time:  Wednesday December 31 2020 06:42:58 EST Ventricular Rate:  61 PR Interval:  146 QRS Duration: 84 QT Interval:  408 QTC Calculation: 410 R Axis:   -43 Text Interpretation: Normal sinus rhythm Left axis deviation Abnormal ECG Confirmed by Godfrey Pick ([id number]) on 12/31/2020 2:16:45 PM  Radiology CT HEAD WO CONTRAST  Result Date: 12/31/2020 CLINICAL DATA:  Neuro deficit, acute, stroke suspected EXAM: CT HEAD WITHOUT CONTRAST TECHNIQUE: Contiguous axial images were obtained from the base of the skull through the vertex without intravenous contrast. COMPARISON:  2015 FINDINGS: Brain: There is no acute intracranial hemorrhage, mass effect, or edema. Gray-white differentiation is preserved. There is no extra-axial fluid collection. Ventricles and sulci are within normal limits in size and configuration. Vascular: There is atherosclerotic calcification at the skull base. Skull: Calvarium is unremarkable. Sinuses/Orbits: Paranasal sinus mucosal thickening. Orbits are unremarkable. Other: None. IMPRESSION: No acute intracranial hemorrhage or evidence of acute infarction. Electronically Signed   By: Macy Mis M.D.   On: 12/31/2020 08:15    Procedures Procedures   Medications Ordered in ED Medications  sodium chloride flush (NS) 0.9 % injection 3 mL (3 mLs Intravenous Not Given 12/31/20 1507)  LORazepam (ATIVAN) injection 1 mg (has no administration in time range)  lactated ringers bolus 1,000 mL (1,000 mLs Intravenous New Bag/Given 12/31/20 1507)    ED Course  I have reviewed the triage vital signs and the nursing notes.  Pertinent labs & imaging results that were available during my care of  the patient were reviewed by me and considered in  my medical decision making (see chart for details).    MDM Rules/Calculators/A&P                          Patient is a healthy 80 year old male who presents for intermittent strokelike symptoms over the past 2 days.  Prior to being bedded in the ED, he was able to undergo lab work and noncontrasted CT scan of his head.  Results of these were unremarkable.  On assessment, patient is currently asymptomatic.  He has no focal neurologic deficits on exam.  Given his history, especially his coworkers report of facial asymmetry and slurred speech, I am concerned of TIA.  CT angiography and MRI studies were ordered.  Care of patient was signed out to oncoming ED provider.  Final Clinical Impression(s) / ED Diagnoses Final diagnoses:  None    Rx / DC Orders ED Discharge Orders     None        Godfrey Pick, MD 12/31/20 1559

## 2021-01-01 ENCOUNTER — Observation Stay (HOSPITAL_BASED_OUTPATIENT_CLINIC_OR_DEPARTMENT_OTHER): Payer: No Typology Code available for payment source

## 2021-01-01 ENCOUNTER — Encounter (HOSPITAL_COMMUNITY): Payer: Self-pay | Admitting: Internal Medicine

## 2021-01-01 ENCOUNTER — Other Ambulatory Visit (HOSPITAL_COMMUNITY): Payer: Self-pay

## 2021-01-01 DIAGNOSIS — I6389 Other cerebral infarction: Secondary | ICD-10-CM | POA: Diagnosis not present

## 2021-01-01 DIAGNOSIS — E7800 Pure hypercholesterolemia, unspecified: Secondary | ICD-10-CM | POA: Diagnosis not present

## 2021-01-01 DIAGNOSIS — N400 Enlarged prostate without lower urinary tract symptoms: Secondary | ICD-10-CM | POA: Diagnosis present

## 2021-01-01 DIAGNOSIS — I639 Cerebral infarction, unspecified: Secondary | ICD-10-CM | POA: Diagnosis not present

## 2021-01-01 DIAGNOSIS — K219 Gastro-esophageal reflux disease without esophagitis: Secondary | ICD-10-CM | POA: Diagnosis present

## 2021-01-01 DIAGNOSIS — F419 Anxiety disorder, unspecified: Secondary | ICD-10-CM | POA: Diagnosis present

## 2021-01-01 DIAGNOSIS — I6322 Cerebral infarction due to unspecified occlusion or stenosis of basilar arteries: Secondary | ICD-10-CM | POA: Diagnosis not present

## 2021-01-01 DIAGNOSIS — I10 Essential (primary) hypertension: Secondary | ICD-10-CM | POA: Diagnosis not present

## 2021-01-01 LAB — CBC
HCT: 44.8 % (ref 39.0–52.0)
Hemoglobin: 15 g/dL (ref 13.0–17.0)
MCH: 28.1 pg (ref 26.0–34.0)
MCHC: 33.5 g/dL (ref 30.0–36.0)
MCV: 84.1 fL (ref 80.0–100.0)
Platelets: 92 10*3/uL — ABNORMAL LOW (ref 150–400)
RBC: 5.33 MIL/uL (ref 4.22–5.81)
RDW: 13.4 % (ref 11.5–15.5)
WBC: 5.6 10*3/uL (ref 4.0–10.5)
nRBC: 0 % (ref 0.0–0.2)

## 2021-01-01 LAB — COMPREHENSIVE METABOLIC PANEL
ALT: 20 U/L (ref 0–44)
AST: 19 U/L (ref 15–41)
Albumin: 4 g/dL (ref 3.5–5.0)
Alkaline Phosphatase: 45 U/L (ref 38–126)
Anion gap: 8 (ref 5–15)
BUN: 13 mg/dL (ref 8–23)
CO2: 23 mmol/L (ref 22–32)
Calcium: 9 mg/dL (ref 8.9–10.3)
Chloride: 106 mmol/L (ref 98–111)
Creatinine, Ser: 1.2 mg/dL (ref 0.61–1.24)
GFR, Estimated: >60 mL/min (ref >60)
Glucose, Bld: 98 mg/dL (ref 70–99)
Potassium: 3.9 mmol/L (ref 3.5–5.1)
Sodium: 137 mmol/L (ref 135–145)
Total Bilirubin: 1.6 mg/dL — ABNORMAL HIGH (ref 0.3–1.2)
Total Protein: 6.2 g/dL — ABNORMAL LOW (ref 6.5–8.1)

## 2021-01-01 LAB — LIPID PANEL
Cholesterol: 184 mg/dL (ref 0–200)
HDL: 32 mg/dL — ABNORMAL LOW (ref >40)
LDL Cholesterol: 128 mg/dL — ABNORMAL HIGH (ref 0–99)
Total CHOL/HDL Ratio: 5.8 RATIO
Triglycerides: 121 mg/dL (ref <150)
VLDL: 24 mg/dL (ref 0–40)

## 2021-01-01 LAB — ECHOCARDIOGRAM COMPLETE
AR max vel: 2.81 cm2
AV Peak grad: 6 mmHg
Ao pk vel: 1.22 m/s
Area-P 1/2: 3.53 cm2
Calc EF: 58.3 %
Height: 70 in
S' Lateral: 2.6 cm
Single Plane A2C EF: 60.1 %
Single Plane A4C EF: 57.7 %
Weight: 2786.61 oz

## 2021-01-01 LAB — RESP PANEL BY RT-PCR (FLU A&B, COVID) ARPGX2
Influenza A by PCR: NEGATIVE
Influenza B by PCR: NEGATIVE
SARS Coronavirus 2 by RT PCR: NEGATIVE

## 2021-01-01 LAB — HEMOGLOBIN A1C
Hgb A1c MFr Bld: 5.1 % (ref 4.8–5.6)
Mean Plasma Glucose: 99.67 mg/dL

## 2021-01-01 LAB — MAGNESIUM: Magnesium: 2.1 mg/dL (ref 1.7–2.4)

## 2021-01-01 MED ORDER — TAMSULOSIN HCL 0.4 MG PO CAPS
0.4000 mg | ORAL_CAPSULE | Freq: Every day | ORAL | Status: DC
  Administered 2021-01-01 – 2021-01-03 (×3): 0.4 mg via ORAL
  Filled 2021-01-01 (×3): qty 1

## 2021-01-01 MED ORDER — SODIUM CHLORIDE 0.9 % IV SOLN: INTRAVENOUS | Status: DC

## 2021-01-01 MED ORDER — PANTOPRAZOLE SODIUM 40 MG PO TBEC
40.0000 mg | DELAYED_RELEASE_TABLET | Freq: Every day | ORAL | Status: DC
  Administered 2021-01-01 – 2021-01-04 (×4): 40 mg via ORAL
  Filled 2021-01-01 (×3): qty 1

## 2021-01-01 MED ORDER — ASPIRIN EC 81 MG PO TBEC
81.0000 mg | DELAYED_RELEASE_TABLET | Freq: Every day | ORAL | Status: DC
  Administered 2021-01-02: 81 mg via ORAL
  Filled 2021-01-01: qty 1

## 2021-01-01 MED ORDER — SERTRALINE HCL 50 MG PO TABS
25.0000 mg | ORAL_TABLET | Freq: Every day | ORAL | Status: DC
  Administered 2021-01-01 – 2021-01-03 (×3): 25 mg via ORAL
  Filled 2021-01-01 (×3): qty 1

## 2021-01-01 MED ORDER — TICAGRELOR 90 MG PO TABS
90.0000 mg | ORAL_TABLET | Freq: Two times a day (BID) | ORAL | Status: DC
  Administered 2021-01-02: 90 mg via ORAL
  Filled 2021-01-01: qty 1

## 2021-01-01 MED ORDER — TICAGRELOR 90 MG PO TABS
180.0000 mg | ORAL_TABLET | Freq: Once | ORAL | Status: AC
  Administered 2021-01-01: 180 mg via ORAL
  Filled 2021-01-01: qty 2

## 2021-01-01 MED ORDER — VITAMIN D 25 MCG (1000 UNIT) PO TABS
2000.0000 [IU] | ORAL_TABLET | Freq: Every day | ORAL | Status: DC
  Administered 2021-01-01 – 2021-01-04 (×4): 2000 [IU] via ORAL
  Filled 2021-01-01 (×4): qty 2

## 2021-01-01 NOTE — Progress Notes (Signed)
STROKE TEAM PROGRESS NOTE   SUBJECTIVE (INTERVAL HISTORY) No family is at the bedside.  Overall his condition is rapidly improving.  He stated that his double vision, numbness has resolved.  Discussed with his stroke and vessel narrowing and potential endovascular procedure tomorrow, he is in agreement.  We will load with Brilinta and continue aspirin.  Discussed with Dr. Sherlon Handing interventional neuroradiology and Dr. Elvera Lennox primary team.   OBJECTIVE Temp:  [97.5 F (36.4 C)-97.8 F (36.6 C)] 97.5 F (36.4 C) (12/01 1647) Pulse Rate:  [47-67] 61 (12/01 1647) Cardiac Rhythm: Normal sinus rhythm (12/01 1145) Resp:  [11-24] 18 (12/01 1647) BP: (104-172)/(54-97) 135/73 (12/01 1647) SpO2:  [96 %-100 %] 99 % (12/01 1647) Weight:  [79 kg] 79 kg (11/30 2210)  Recent Labs  Lab 12/31/20 0723  GLUCAP 127*   Recent Labs  Lab 12/31/20 0715 12/31/20 0730 01/01/21 0250  NA 138 141 137  K 3.9 3.9 3.9  CL 107 105 106  CO2 23  --  23  GLUCOSE 121* 119* 98  BUN 17 18 13   CREATININE 1.33* 1.30* 1.20  CALCIUM 9.2  --  9.0  MG  --   --  2.1   Recent Labs  Lab 12/31/20 0715 01/01/21 0250  AST 22 19  ALT 20 20  ALKPHOS 50 45  BILITOT 1.0 1.6*  PROT 6.6 6.2*  ALBUMIN 4.3 4.0   Recent Labs  Lab 12/31/20 0715 12/31/20 0730 01/01/21 0250  WBC 4.1  --  5.6  NEUTROABS 3.0  --   --   HGB 15.4 15.0 15.0  HCT 45.3 44.0 44.8  MCV 83.1  --  84.1  PLT 83*  --  92*   No results for input(s): CKTOTAL, CKMB, CKMBINDEX, TROPONINI in the last 168 hours. Recent Labs    12/31/20 0715  LABPROT 13.4  INR 1.0   No results for input(s): COLORURINE, LABSPEC, PHURINE, GLUCOSEU, HGBUR, BILIRUBINUR, KETONESUR, PROTEINUR, UROBILINOGEN, NITRITE, LEUKOCYTESUR in the last 72 hours.  Invalid input(s): APPERANCEUR     Component Value Date/Time   CHOL 184 01/01/2021 0250   TRIG 121 01/01/2021 0250   HDL 32 (L) 01/01/2021 0250   CHOLHDL 5.8 01/01/2021 0250   VLDL 24 01/01/2021 0250   LDLCALC 128  (H) 01/01/2021 0250   Lab Results  Component Value Date   HGBA1C 5.1 01/01/2021   No results found for: LABOPIA, COCAINSCRNUR, LABBENZ, AMPHETMU, THCU, LABBARB  No results for input(s): ETH in the last 168 hours.  I have personally reviewed the radiological images below and agree with the radiology interpretations.  CT ANGIO HEAD NECK W WO CM  Result Date: 12/31/2020 CLINICAL DATA:  Stroke/TIA.  Left-sided numbness and weakness. EXAM: CT ANGIOGRAPHY HEAD AND NECK TECHNIQUE: Multidetector CT imaging of the head and neck was performed using the standard protocol during bolus administration of intravenous contrast. Multiplanar CT image reconstructions and MIPs were obtained to evaluate the vascular anatomy. Carotid stenosis measurements (when applicable) are obtained utilizing NASCET criteria, using the distal internal carotid diameter as the denominator. CONTRAST:  55mL OMNIPAQUE IOHEXOL 350 MG/ML SOLN COMPARISON:  CT head 12/31/2020 FINDINGS: CTA NECK FINDINGS Aortic arch: Minimal atherosclerotic disease aortic arch. Proximal great vessels widely patent. Right carotid system: Mild atherosclerotic disease right carotid bifurcation without significant stenosis. Left carotid system: Mild atherosclerotic disease left carotid bifurcation without stenosis Vertebral arteries: Right vertebral artery dominant and widely patent Small left vertebral artery is occluded in the mid neck with faint reconstitution distally with very small  vessel. Skeleton: ACDF C4 through C6. No definite solid fusion at C4-5. Posterior cerclage wires at C3-4. Posterior screw and plate fusion C2 through C4. No acute skeletal abnormality. Other neck: Left lower pole thyroid nodule 2.6 cm. Right lower pole thyroid nodule 11.8 cm. Recommend thyroid ultrasound (ref: J Am Coll Radiol. 2015 Feb;12(2): 143-50). Upper chest: Lung apices clear bilaterally. Review of the MIP images confirms the above findings CTA HEAD FINDINGS Anterior  circulation: Mild atherosclerotic disease in the cavernous carotid bilaterally without stenosis. Anterior and middle cerebral arteries widely patent bilaterally. 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 Right PICA is patent. Occlusion of the mid left vertebral artery with reconstitution distally. Left vertebral artery is small and diseased vessel which supplies left PICA and small contribution to the basilar. Atherosclerotic irregularity and moderate stenosis in the basilar artery. Posterior cerebral arteries patent bilaterally. Fetal origin right posterior cerebral artery. Venous sinuses: Normal venous enhancement Anatomic variants: None Review of the MIP images confirms the above findings IMPRESSION: 1. Negative for intracranial large vessel occlusion. 2. 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 3. Diffusely disease left vertebral artery which is small and occluded in the mid segment. Faint reconstitution distally. 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 Moderate stenosis basilar. 4. Bilateral thyroid nodules. Largest nodule on the left 2.6 cm. Recommend thyroid ultrasound. (Ref: J Am Coll Radiol. 2015 Feb;12(2): 143-50). Electronically Signed   By: Marlan Palau M.D.   On: 12/31/2020 18:09   CT HEAD WO CONTRAST  Result Date: 12/31/2020 CLINICAL DATA:  Neuro deficit, acute, stroke suspected EXAM: CT HEAD WITHOUT CONTRAST TECHNIQUE: Contiguous axial images were obtained from the base of the skull through the vertex without intravenous contrast. COMPARISON:  2015 FINDINGS: Brain: There is no acute intracranial hemorrhage, mass effect, or edema. Gray-white differentiation is preserved. There is no extra-axial fluid collection. Ventricles and sulci are within normal limits in size and configuration. Vascular: There is atherosclerotic  calcification at the skull base. Skull: Calvarium is unremarkable. Sinuses/Orbits: Paranasal sinus mucosal thickening. Orbits are unremarkable. Other: None. IMPRESSION: No acute intracranial hemorrhage or evidence of acute infarction. Electronically Signed   By: Guadlupe Spanish M.D.   On: 12/31/2020 08:15   MR BRAIN WO CONTRAST  Result Date: 12/31/2020 CLINICAL DATA:  Stroke/TIA.  Left-sided numbness and weakness. EXAM: MRI HEAD WITHOUT CONTRAST TECHNIQUE: Multiplanar, multiecho pulse sequences of the brain and surrounding structures were obtained without intravenous contrast. 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 Small area of acute infarct in the right posterior pons in the floor of the fourth ventricle. Ventricle size and cerebral volume normal. Negative for hemorrhage or mass. Normal white matter. Vascular: Normal arterial flow voids Skull and upper cervical spine: Negative Sinuses/Orbits: Mucosal edema paranasal sinuses.  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 Tiny acute infarct in the right posterior pons. No significant chronic ischemia Electronically Signed   By: Marlan Palau M.D.   On: 12/31/2020 19:14   ECHOCARDIOGRAM COMPLETE  Result Date: 01/01/2021    ECHOCARDIOGRAM REPORT   Patient Name:   Kevin Santiago [facility] Date of Exam: 01/01/2021 Medical Rec #:  [id number]     Height:       70.0 in Accession #:    [id number]    Weight:       174.2 lb Date of Birth:  05/24/1940  BSA:          1.968 m Patient Age:    80 years      BP:           125/81 mmHg Patient Gender: M             HR:           49 bpm. Exam Location:  Inpatient Procedure: 2D Echo, Cardiac Doppler and Color Doppler Indications:    Stroke  History:        Patient has no prior history of Echocardiogram examinations.                 Risk Factors:Hypertension.  Sonographer:    Cleatis Polka Referring Phys: [id number] JUSTIN B HOWERTER IMPRESSIONS  1. Left ventricular ejection  fraction, by estimation, is 60 to 65%. The left ventricle has normal function. The left ventricle has no regional wall motion abnormalities. There is mild left ventricular hypertrophy. Left ventricular diastolic parameters were normal.  2. Right ventricular systolic function is normal. The right ventricular size is normal. There is normal pulmonary artery systolic pressure.  3. The mitral valve is normal in structure. Trivial mitral valve regurgitation. No evidence of mitral stenosis.  4. The aortic valve is tricuspid. Aortic valve regurgitation is not visualized. Aortic valve sclerosis is present, with no evidence of aortic valve stenosis.  5. Aortic dilatation noted. There is mild dilatation of the ascending aorta, measuring 43 mm.  6. The inferior vena cava is dilated in size with >50% respiratory variability, suggesting right atrial pressure of 8 mmHg. Comparison(s): No prior Echocardiogram. FINDINGS  Left Ventricle: Left ventricular ejection fraction, by estimation, is 60 to 65%. The left ventricle has normal function. The left ventricle has no regional wall motion abnormalities. The left ventricular internal cavity size was normal in size. There is  mild left ventricular hypertrophy. Left ventricular diastolic parameters were normal. Right Ventricle: The right ventricular size is normal. Right ventricular systolic function is normal. There is normal pulmonary artery systolic pressure. The tricuspid regurgitant velocity is 2.50 m/s, and with an assumed right atrial pressure of 8 mmHg,  the estimated right ventricular systolic pressure is 33.0 mmHg. Left Atrium: Left atrial size was normal in size. Right Atrium: Right atrial size was normal in size. Pericardium: There is no evidence of pericardial effusion. Mitral Valve: The mitral valve is normal in structure. Trivial mitral valve regurgitation. No evidence of mitral valve stenosis. Tricuspid Valve: The tricuspid valve is normal in structure. Tricuspid valve  regurgitation is mild . No evidence of tricuspid stenosis. Aortic Valve: The aortic valve is tricuspid. Aortic valve regurgitation is not visualized. Aortic valve sclerosis is present, with no evidence of aortic valve stenosis. Aortic valve peak gradient measures 6.0 mmHg. Pulmonic Valve: The pulmonic valve was normal in structure. Pulmonic valve regurgitation is trivial. No evidence of pulmonic stenosis. Aorta: Aortic dilatation noted. There is mild dilatation of the ascending aorta, measuring 43 mm. Venous: The inferior vena cava is dilated in size with greater than 50% respiratory variability, suggesting right atrial pressure of 8 mmHg. IAS/Shunts: No atrial level shunt detected by color flow Doppler.  LEFT VENTRICLE PLAX 2D LVIDd:         4.40 cm     Diastology LVIDs:         2.60 cm     LV e' medial:    7.83 cm/s LV PW:         1.20 cm  LV E/e' medial:  9.3 LV IVS:        1.20 cm     LV e' lateral:   8.81 cm/s LVOT diam:     2.00 cm     LV E/e' lateral: 8.3 LV SV:         78 LV SV Index:   40 LVOT Area:     3.14 cm  LV Volumes (MOD) LV vol d, MOD A2C: 89.2 ml LV vol d, MOD A4C: 90.3 ml LV vol s, MOD A2C: 35.6 ml LV vol s, MOD A4C: 38.2 ml LV SV MOD A2C:     53.6 ml LV SV MOD A4C:     90.3 ml LV SV MOD BP:      52.4 ml RIGHT VENTRICLE             IVC RV Basal diam:  3.30 cm     IVC diam: 2.20 cm RV Mid diam:    2.40 cm RV S prime:     10.80 cm/s TAPSE (M-mode): 2.1 cm LEFT ATRIUM             Index        RIGHT ATRIUM           Index LA diam:        4.20 cm 2.13 cm/m   RA Area:     16.90 cm LA Vol (A2C):   42.2 ml 21.44 ml/m  RA Volume:   45.10 ml  22.91 ml/m LA Vol (A4C):   40.6 ml 20.63 ml/m LA Biplane Vol: 42.0 ml 21.34 ml/m  AORTIC VALVE AV Area (Vmax): 2.81 cm AV Vmax:        122.00 cm/s AV Peak Grad:   6.0 mmHg LVOT Vmax:      109.00 cm/s LVOT Vmean:     76.300 cm/s LVOT VTI:       0.249 m  AORTA Ao Root diam: 3.20 cm Ao Asc diam:  3.90 cm MITRAL VALVE               TRICUSPID VALVE MV Area (PHT):  3.53 cm    TR Peak grad:   25.0 mmHg MV Decel Time: 215 msec    TR Vmax:        250.00 cm/s MV E velocity: 73.10 cm/s MV A velocity: 74.40 cm/s  SHUNTS MV E/A ratio:  0.98        Systemic VTI:  0.25 m                            Systemic Diam: 2.00 cm Olga Millers MD Electronically signed by Olga Millers MD Signature Date/Time: 01/01/2021/11:51:11 AM    Final       PHYSICAL EXAM  Temp:  [97.5 F (36.4 C)-97.8 F (36.6 C)] 97.5 F (36.4 C) (12/01 1647) Pulse Rate:  [47-67] 61 (12/01 1647) Resp:  [11-24] 18 (12/01 1647) BP: (104-172)/(54-97) 135/73 (12/01 1647) SpO2:  [96 %-100 %] 99 % (12/01 1647) Weight:  [79 kg] 79 kg (11/30 2210)  General - Well nourished, well developed, in no apparent distress.  Ophthalmologic - fundi not visualized due to noncooperation.  Cardiovascular - Regular rhythm and rate.  Mental Status -  Level of arousal and orientation to time, place, and person were intact. Language including expression, naming, repetition, comprehension was assessed and found intact. Fund of Knowledge was assessed and was intact.  Cranial Nerves II - XII - II -  Visual field intact OU. III, IV, VI - Extraocular movements intact. V - Facial sensation intact bilaterally. VII - Facial movement intact bilaterally. VIII - Hearing & vestibular intact bilaterally. X - Palate elevates symmetrically. XI - Chin turning & shoulder shrug intact bilaterally. XII - Tongue protrusion intact.  Motor Strength - The patient's strength was normal in all extremities and pronator drift was absent.  Bulk was normal and fasciculations were absent.   Motor Tone - Muscle tone was assessed at the neck and appendages and was normal.  Reflexes - The patient's reflexes were symmetrical in all extremities and he had no pathological reflexes.  Sensory - Light touch, temperature/pinprick were assessed and were symmetrical.    Coordination - The patient had normal movements in the hands and left foot  with no ataxia or dysmetria.  Slight dysmetria and right HTS. Tremor was absent.  Gait and Station - deferred.   ASSESSMENT/PLAN [name] is a 80 y.o. male with history of hypertension, hyperlipidemia, depression admitted for mild double vision, imbalance, left-sided numbness and dysarthria. No tPA given due to outside window.    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 CT no acute finding CT head and neck mid basilar arteries severe stenosis, right VA tandem stenosis MRI left mid pontine infarct, right ventral pontine punctate infarct. 2D Echo EF 60 to 65% LDL 128 HgbA1c 5.1 SCDs for VTE prophylaxis No antithrombotic prior to admission, now on aspirin 81 mg daily and Brilinta (ticagrelor) 90 mg bid after loading. Patient counseled to be compliant with his antithrombotic medications Ongoing aggressive stroke risk factor management Therapy recommendations: Outpatient PT Disposition: Pending  Posterior circulation severe stenosis CT head and neck mid basilar arteries severe stenosis, right VA tandem stenosis Likely the cause of current stroke Discussed with Dr. Sherlon Handing interventional radiology, will consider right VA and BA standing tomorrow. N.p.o. after midnight. On aspirin and Brilinta after loading.  Hypertension Stable Avoid low BP BP goal 130-160 before intervention Long term BP goal normotensive after intervention  Hyperlipidemia Home meds: Pravastatin 40 LDL 128, goal < 70 Now on Lipitor 80 Continue statin at discharge  Other Stroke Risk Factors Advanced age  Other Active Problems Depression on [facility] day # 0   Marvel Plan, MD PhD Stroke Neurology 01/01/2021 5:05 PM    To contact Stroke Continuity provider, please refer to [email or web address]. After hours, contact General Neurology

## 2021-01-01 NOTE — Evaluation (Addendum)
Physical Therapy Evaluation Patient Details Name: Kevin Santiago MRN: [id number] DOB: 1940/06/14 Today's Date: 01/01/2021  History of Present Illness  Pt is a 80 y/o male admitted secondary to L sided numbness and speech deficits. 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 PMH includes HTN.  Clinical Impression  Pt admitted secondary to problem above with deficits below. Pt unsteady and demonstrated ataxia in LLE>RLE. Min A for steadying without AD. Improved steadiness noted with use of RW this session. Educated about using RW at home to increase safety. Recommending outpatient neuro PT to address current deficits. Will continue to follow acutely.        Recommendations for follow up therapy are one component of a multi-disciplinary discharge planning process, led by the attending physician.  Recommendations may be updated based on patient status, additional functional criteria and insurance authorization.  Follow Up Recommendations Outpatient PT (neuro outpatient)    Assistance Recommended at Discharge Frequent or constant Supervision/Assistance  Functional Status Assessment Patient has had a recent decline in their functional status and demonstrates the ability to make significant improvements in function in a reasonable and predictable amount of time.  Equipment Recommendations  Rolling walker (2 wheels)    Recommendations for Other Services       Precautions / Restrictions Precautions Precautions: Fall Restrictions Weight Bearing Restrictions: No      Mobility  Bed Mobility Overal bed mobility: Needs Assistance Bed Mobility: Supine to Sit;Sit to Supine     Supine to sit: Supervision Sit to supine: Supervision   General bed mobility comments: Supervision for safety.    Transfers Overall transfer level: Needs assistance Equipment used: None;Rolling walker (2 wheels) Transfers: Sit to/from Stand;Bed to chair/wheelchair/BSC Sit to Stand: Min guard   Step  pivot transfers: Min guard       General transfer comment: Min guard for safety. min cues for walker safety    Ambulation/Gait Ambulation/Gait assistance: Min assist;Min guard Gait Distance (Feet): 115 Feet Assistive device: None;Rolling walker (2 wheels) Gait Pattern/deviations: Step-through pattern;Decreased stride length;Ataxic Gait velocity: Decreased     General Gait Details: Pt unsteady without use of AD. Gross min A required for steadying. Ataxia noted in LLE>RLE. Improved steadiness with use of RW. Educated about use at home  Stairs            Wheelchair Mobility    Modified Rankin (Stroke Patients Only) Modified Rankin (Stroke Patients Only) Pre-Morbid Rankin Score: No symptoms Modified Rankin: Moderately severe disability     Balance Overall balance assessment: Needs assistance Sitting-balance support: No upper extremity supported;Feet supported Sitting balance-Leahy Scale: Good     Standing balance support: No upper extremity supported;Bilateral upper extremity supported Standing balance-Leahy Scale: Fair Standing balance comment: Fair static standing, but poor dynamic balance                             Pertinent Vitals/Pain Pain Assessment: No/denies pain    Home Living Family/patient expects to be discharged to:: Private residence Living Arrangements: Alone Available Help at Discharge: Family;Available PRN/intermittently Type of Home: House Home Access: Stairs to enter Entrance Stairs-Rails: None Entrance Stairs-Number of Steps: 2   Home Layout: Two level;Able to live on main level with bedroom/bathroom Home Equipment: Shower seat - built in Additional Comments: Has a person that rents the basement, but don't have much interaction. Son can assist    Prior Function Prior Level of Function : Independent/Modified Independent;Driving  Hand Dominance   Dominant Hand: Right    Extremity/Trunk Assessment    Upper Extremity Assessment Upper Extremity Assessment: Overall WFL for tasks assessed    Lower Extremity Assessment Lower Extremity Assessment: Defer to PT evaluation RLE Deficits / Details: Mildly decreased coordination when performing heel to shin test and noted mild ataxia in RLE during ambulation as well. LLE Deficits / Details: Decreased coordination noted with heel to shin test; also noted during gait.    Cervical / Trunk Assessment Cervical / Trunk Assessment: Normal  Communication      Cognition Arousal/Alertness: Awake/alert Behavior During Therapy: WFL for tasks assessed/performed Overall Cognitive Status: Within Functional Limits for tasks assessed                                          General Comments General comments (skin integrity, edema, etc.): pt reports son can stay with him initially, and also reports son could drive him to OP for therapy.    Exercises     Assessment/Plan    PT Assessment Patient needs continued PT services  PT Problem List Decreased activity tolerance;Decreased balance;Decreased coordination;Decreased knowledge of use of DME       PT Treatment Interventions DME instruction;Gait training;Functional mobility training;Therapeutic activities;Balance training;Therapeutic exercise;Patient/family education;Stair training;Neuromuscular re-education    PT Goals (Current goals can be found in the Care Plan section)  Acute Rehab PT Goals Patient Stated Goal: to go home PT Goal Formulation: With patient Time For Goal Achievement: 01/15/21 Potential to Achieve Goals: Good    Frequency Min 4X/week   Barriers to discharge        Co-evaluation               AM-PAC PT "6 Clicks" Mobility  Outcome Measure Help needed turning from your back to your side while in a flat bed without using bedrails?: None Help needed moving from lying on your back to sitting on the side of a flat bed without using bedrails?: None Help  needed moving to and from a bed to a chair (including a wheelchair)?: A Little Help needed standing up from a chair using your arms (e.g., wheelchair or bedside chair)?: A Little Help needed to walk in hospital room?: A Little Help needed climbing 3-5 steps with a railing? : A Lot 6 Click Score: 19    End of Session Equipment Utilized During Treatment: Gait belt Activity Tolerance: Patient tolerated treatment well Patient left: in bed;with call bell/phone within reach (on stretcher in ED) Nurse Communication: Mobility status PT Visit Diagnosis: Unsteadiness on feet (R26.81);Ataxic gait (R26.0);Other symptoms and signs involving the nervous system (R29.898)    Time: 1001-1017 PT Time Calculation (min) (ACUTE ONLY): 16 min   Charges:   PT Evaluation $PT Eval Moderate Complexity: 1 Mod          Farley Ly, PT, DPT  Acute Rehabilitation Services  Pager: [phone] Office: [phone]   Lehman Prom 01/01/2021, 11:48 AM

## 2021-01-01 NOTE — Evaluation (Signed)
Speech Language Pathology Evaluation Patient Details Name: Kevin Santiago MRN: [id number] DOB: Sep 27, 1940 Today's Date: 01/01/2021 Time: 9381-8299 SLP Time Calculation (min) (ACUTE ONLY): 17 min  Problem List:  Patient Active Problem List   Diagnosis Date Noted   Hypertension    Hypercholesteremia    Anxiety    BPH (benign prostatic hyperplasia)    GERD (gastroesophageal reflux disease)    CVA (cerebral vascular accident) (HCC) 12/31/2020   Vitamin D insufficiency 07/04/2014   Hypocalcemia 07/02/2014   Prediabetes 07/02/2014   AKI (acute kidney injury) (HCC) 07/02/2014   Other pancytopenia (HCC)    Hemoperitoneum    Thrombocytopenia (HCC)    Splenomegaly 07/01/2014   Spleen injury 07/01/2014   Past Medical History:  Past Medical History:  Diagnosis Date   Anxiety    Depression    Hypercholesteremia    Hypertension    Past Surgical History:  Past Surgical History:  Procedure Laterality Date   cervical fusion     SHOULDER SURGERY Left 1990   WRIST FRACTURE SURGERY     HPI:  80 year old man with history of hypercholesteremia, hypertension presenting with sudden onset of gait imbalance, left-sided intermittent tingling and numbness,significant ataxia in all 4 extremities. 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   Assessment / Plan / Recommendation Clinical Impression  Pt presents with normal expressive/receptive language; speech is fluent with mild dysarthria, improved since yesterday per pt. Cognition appears to be [facility]; at baseline.  Pt with mild difficulty with basic written math and verbal recall, likely baseline per our discussion. He is a retired Curator; lives alone (renter in basement); son next door.  There are no speech f/u needs identified. Our service will sign off.    SLP Assessment  SLP Recommendation/Assessment: Patient does not need any further Speech Lanaguage Pathology Services SLP Visit Diagnosis:  Cognitive communication deficit (R41.841)    Recommendations for follow up therapy are one component of a multi-disciplinary discharge planning process, led by the attending physician.  Recommendations may be updated based on patient status, additional functional criteria and insurance authorization.    Follow Up Recommendations  No SLP follow up    Assistance Recommended at Discharge  Other (comment) (awaiting OT/PT assessments)                 SLP Evaluation Cognition  Overall Cognitive Status: Within Functional Limits for tasks assessed Arousal/Alertness: Awake/alert Orientation Level: Oriented to person;Oriented to place;Oriented to time;Oriented to situation Attention: Selective Selective Attention: Appears intact Immediate Memory Recall:  (wfl) Awareness: Appears intact Problem Solving: Appears intact Safety/Judgment: Appears intact       Comprehension  Auditory Comprehension Overall Auditory Comprehension: Appears within functional limits for tasks assessed    Expression Expression Primary Mode of Expression: Verbal Verbal Expression Overall Verbal Expression: Appears within functional limits for tasks assessed Written Expression Dominant Hand: Right   Oral / Motor  Oral Motor/Sensory Function Overall Oral Motor/Sensory Function: Within functional limits (mild left lower facial asymmetry) Motor Speech Overall Motor Speech: Appears within functional limits for tasks assessed   GO                    Blenda Mounts Laurice 01/01/2021, 10:00 AM  Marchelle Folks L. Samson Frederic, MA CCC/SLP Acute Rehabilitation Services Office number [phone] Pager [phone]

## 2021-01-01 NOTE — Progress Notes (Signed)
Pt arrived from the ED to 3w35. Oriented pt to room. Call bell in reach. Bed alarm set and educated pt on the reason. Pt knows to call when he gets out of the bed to prevent a fall. VSS. No pain. Skin intact. NIH 0. Pt walked to the bed with no problems.

## 2021-01-01 NOTE — Evaluation (Signed)
Clinical/Bedside Swallow Evaluation Patient Details  Name: Kevin Santiago MRN: [id number] Date of Birth: Jan 19, 1941  Today's Date: 01/01/2021 Time: SLP Start Time (ACUTE ONLY): 2725 SLP Stop Time (ACUTE ONLY): 0939 SLP Time Calculation (min) (ACUTE ONLY): 17 min  Past Medical History:  Past Medical History:  Diagnosis Date   Anxiety    Depression    Hypercholesteremia    Hypertension    Past Surgical History:  Past Surgical History:  Procedure Laterality Date   cervical fusion     SHOULDER SURGERY Left 1990   WRIST FRACTURE SURGERY     HPI:  80 year old man with history of hypercholesteremia, hypertension presenting with sudden onset of gait imbalance, left-sided intermittent tingling and numbness,significant ataxia in all 4 extremities. 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    Assessment / Plan / Recommendation  Clinical Impression  Pt presents with normal swallowing with thorough mastication, brisk swallow, no s/s of aspiration. He self-fed crackers, applesauce, followed by water from a straw with no indication of dysphagia. He reports improvement since last night. Recommend resuming a regular diet, thin liquids; meds whole with water. No SLP f/u for swallowing is needed. D/W RN. SLP Visit Diagnosis: Dysphagia, unspecified (R13.10)    Aspiration Risk  No limitations    Diet Recommendation   Regular solids, thin liquids  Medication Administration: Whole meds with liquid    Other  Recommendations   N/a   Recommendations for follow up therapy are one component of a multi-disciplinary discharge planning process, led by the attending physician.  Recommendations may be updated based on patient status, additional functional criteria and insurance authorization.  Follow up Recommendations   No slp f/u       Swallow Study   General Date of Onset: 12/31/20 HPI: 80 year old man with history of hypercholesteremia,  hypertension presenting with sudden onset of gait imbalance, left-sided intermittent tingling and numbness,significant ataxia in all 4 extremities. 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 Type of Study: Bedside Swallow Evaluation Previous Swallow Assessment: no Diet Prior to this Study: NPO Temperature Spikes Noted: No Respiratory Status: Room air History of Recent Intubation: No Behavior/Cognition: Alert;Cooperative;Pleasant mood Oral Cavity Assessment: Within Functional Limits Oral Care Completed by SLP: Yes Oral Cavity - Dentition: Adequate natural dentition Self-Feeding Abilities: Able to feed self Patient Positioning: Upright in bed Baseline Vocal Quality: Normal Volitional Cough: Strong Volitional Swallow: Able to elicit    Oral/Motor/Sensory Function Overall Oral Motor/Sensory Function: Within functional limits (mild right lower facial asymmetry; otherwise wnl)   Ice Chips Ice chips: Within functional limits   Thin Liquid Thin Liquid: Within functional limits    Nectar Thick Nectar Thick Liquid: Not tested   Honey Thick Honey Thick Liquid: Not tested   Puree Puree: Within functional limits   Solid    Allen Basista L. Samson Frederic, MA CCC/SLP Acute Rehabilitation Services Office number [phone] Pager [phone]  Solid: Within functional limits      Blenda Mounts Laurice 01/01/2021,9:40 AM

## 2021-01-01 NOTE — TOC Initial Note (Signed)
Transition of Care [facility]) - Initial/Assessment Note    Patient Details  Name: Kevin Santiago MRN: [id number] Date of Birth: 03-May-1940  Transition of Care [facility] CM/SW Contact:    Kermit Balo, RN Phone Number: 01/01/2021, 12:59 PM  Clinical Narrative:                 Patient is from home alone. He has no current DME. States his son can check in on him. CM provided choice for outpatient and he prefers [place]. CM has placed referral with information on the AVS. Walker ordered through [employer] and will be delivered to the room. Pt denies issues with transportation or home medications. TOC following.  Expected Discharge Plan: OP Rehab Barriers to Discharge: Continued Medical Work up   Patient Goals and CMS Choice     Choice offered to / list presented to : Patient  Expected Discharge Plan and Services Expected Discharge Plan: OP Rehab   Discharge Planning Services: CM Consult Post Acute Care Choice: Durable Medical Equipment Living arrangements for the past 2 months: Single Family Home                 DME Arranged: Walker rolling DME Agency: [employer] Date DME Agency Contacted: 01/01/21   Representative spoke with at DME Agency: Velna Hatchet            Prior Living Arrangements/Services Living arrangements for the past 2 months: Single Family Home Lives with:: Self Patient language and need for interpreter reviewed:: Yes Do you feel safe going back to the place where you live?: Yes        Care giver support system in place?: No (comment)   Criminal Activity/Legal Involvement Pertinent to Current Situation/Hospitalization: No - Comment as needed  Activities of Daily Living      Permission Sought/Granted                  Emotional Assessment Appearance:: Appears stated age Attitude/Demeanor/Rapport: Engaged Affect (typically observed): Accepting Orientation: : Oriented to Self, Oriented to Place, Oriented to  Time, Oriented to Situation   Psych  Involvement: No (comment)  Admission diagnosis:  CVA (cerebral vascular accident) (HCC) [I63.9] Cerebrovascular accident (CVA), unspecified mechanism (HCC) [I63.9] Patient Active Problem List   Diagnosis Date Noted   Hypertension    Hypercholesteremia    Anxiety    BPH (benign prostatic hyperplasia)    GERD (gastroesophageal reflux disease)    CVA (cerebral vascular accident) (HCC) 12/31/2020   Vitamin D insufficiency 07/04/2014   Hypocalcemia 07/02/2014   Prediabetes 07/02/2014   AKI (acute kidney injury) (HCC) 07/02/2014   Other pancytopenia (HCC)    Hemoperitoneum    Thrombocytopenia (HCC)    Splenomegaly 07/01/2014   Spleen injury 07/01/2014   PCP:  Patient, No Pcp Per (Inactive) Pharmacy:   [id number] [place], [place] - [address] [place] [address] Phone: [phone] Fax: [phone]     Social Determinants of Health (SDOH) Interventions    Readmission Risk Interventions No flowsheet data found.

## 2021-01-01 NOTE — Evaluation (Addendum)
Occupational Therapy Evaluation Patient Details Name: Kevin Santiago MRN: [id number] DOB: 1940-12-24 Today's Date: 01/01/2021   History of Present Illness Pt is a 80 y/o male admitted secondary to L sided numbness and speech deficits. 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 PMH includes HTN.   Clinical Impression   Pt admitted with above. He demonstrates the below listed deficits and will benefit from continued OT to maximize safety and independence with BADLs.  Pt presents to OT with balance deficits that impact his ability to safely perform ADLs.  Currently he requires min guard assist for ADLs using RW.  He lives alone with son close by and available to assist as necessary.  Will follow acutely.        Recommendations for follow up therapy are one component of a multi-disciplinary discharge planning process, led by the attending physician.  Recommendations may be updated based on patient status, additional functional criteria and insurance authorization.   Follow Up Recommendations  No OT follow up    Assistance Recommended at Discharge Intermittent Supervision/Assistance  Functional Status Assessment  Patient has had a recent decline in their functional status and demonstrates the ability to make significant improvements in function in a reasonable and predictable amount of time.  Equipment Recommendations  None recommended by OT    Recommendations for Other Services       Precautions / Restrictions Precautions Precautions: Fall Restrictions Weight Bearing Restrictions: No      Mobility Bed Mobility Overal bed mobility: Needs Assistance Bed Mobility: Supine to Sit;Sit to Supine     Supine to sit: Supervision Sit to supine: Supervision   General bed mobility comments: Supervision for safety.    Transfers Overall transfer level: Needs assistance Equipment used: None;Rolling walker (2 wheels) Transfers: Sit to/from Stand;Bed to chair/wheelchair/BSC Sit  to Stand: Min guard     Step pivot transfers: Min guard     General transfer comment: Min guard for safety. min cues for walker safety      Balance Overall balance assessment: Needs assistance Sitting-balance support: No upper extremity supported;Feet supported Sitting balance-Leahy Scale: Good     Standing balance support: No upper extremity supported;Bilateral upper extremity supported Standing balance-Leahy Scale: Fair Standing balance comment: Fair static standing, but poor dynamic balance                           ADL either performed or assessed with clinical judgement   ADL Overall ADL's : Needs assistance/impaired Eating/Feeding: Independent   Grooming: Wash/dry hands;Wash/dry face;Oral care;Brushing hair;Min guard;Standing   Upper Body Bathing: Set up;Supervision/ safety;Sitting   Lower Body Bathing: Min guard;Sit to/from stand   Upper Body Dressing : Set up;Sitting   Lower Body Dressing: Min guard;Sit to/from stand   Toilet Transfer: Min guard;Ambulation;Comfort height toilet;Rolling walker (2 wheels)   Toileting- Clothing Manipulation and Hygiene: Min guard;Sit to/from Nurse, children's Details (indicate cue type and reason): discussed recommendation for pt to sit to shower, and he verbalized understanding Functional mobility during ADLs: Min guard;Rolling walker (2 wheels) General ADL Comments: reviewed safety with ADLs     Vision Baseline Vision/History: 1 Wears glasses Ability to See in Adequate Light: 0 Adequate Patient Visual Report: No change from baseline Vision Assessment?: Yes Eye Alignment: Within Functional Limits Ocular Range of Motion: Within Functional Limits Alignment/Gaze Preference: Within Defined Limits Tracking/Visual Pursuits: Able to track stimulus in all quads without difficulty Convergence: Within  functional limits     Perception     Praxis      Pertinent Vitals/Pain Pain Assessment: No/denies pain      Hand Dominance Right   Extremity/Trunk Assessment Upper Extremity Assessment Upper Extremity Assessment: Overall WFL for tasks assessed   Lower Extremity Assessment Lower Extremity Assessment: Defer to PT evaluation RLE Deficits / Details: Mildly decreased coordination when performing heel to shin test and noted mild ataxia in RLE during ambulation as well. LLE Deficits / Details: Decreased coordination noted with heel to shin test; also noted during gait.   Cervical / Trunk Assessment Cervical / Trunk Assessment: Normal   Communication     Cognition Arousal/Alertness: Awake/alert Behavior During Therapy: WFL for tasks assessed/performed Overall Cognitive Status: Within Functional Limits for tasks assessed                                       General Comments  pt reports son can stay with him initially, and also reports son could drive him to OP for therapy.    Exercises     Shoulder Instructions      Home Living Family/patient expects to be discharged to:: Private residence Living Arrangements: Alone Available Help at Discharge: Family;Available PRN/intermittently Type of Home: House Home Access: Stairs to enter [employer] of Steps: 2 Entrance Stairs-Rails: None Home Layout: Two level;Able to live on main level with bedroom/bathroom     Bathroom Shower/Tub: Producer, television/film/video: Standard     Home Equipment: Shower seat - built in   Additional Comments: Has a person that rents the basement, but don't have much interaction. Son can assist  Lives With: Alone;Other (Comment) (renter in basement; son next door)    Prior Functioning/Environment Prior Level of Function : Independent/Modified Independent;Driving                        OT Problem List: Impaired balance (sitting and/or standing);Decreased activity tolerance;Decreased safety awareness;Decreased knowledge of use of DME or AE      OT  Treatment/Interventions: Self-care/ADL training;DME and/or AE instruction;Therapeutic activities;Patient/family education;Balance training    OT Goals(Current goals can be found in the care plan section) Acute Rehab OT Goals Patient Stated Goal: to get back to normal OT Goal Formulation: With patient Time For Goal Achievement: 01/15/21 Potential to Achieve Goals: Good ADL Goals Pt Will Perform Grooming: with modified independence;standing Pt Will Perform Lower Body Bathing: with modified independence;sit to/from stand Pt Will Perform Lower Body Dressing: with modified independence;sit to/from stand Pt Will Transfer to Toilet: with modified independence;ambulating;regular height toilet;grab bars Pt Will Perform Toileting - Clothing Manipulation and hygiene: with modified independence;sit to/from stand Pt Will Perform Tub/Shower Transfer: Shower transfer;with modified independence;ambulating;shower seat;rolling walker  OT Frequency: Min 2X/week   Barriers to D/C:            Co-evaluation              AM-PAC OT "6 Clicks" Daily Activity     Outcome Measure Help from another person eating meals?: None Help from another person taking care of personal grooming?: A Little Help from another person toileting, which includes using toliet, bedpan, or urinal?: A Little Help from another person bathing (including washing, rinsing, drying)?: A Little Help from another person to put on and taking off regular upper body clothing?: A Little Help from another person to put  on and taking off regular lower body clothing?: A Little 6 Click Score: 19   End of Session Equipment Utilized During Treatment: Rolling walker (2 wheels);Gait belt Nurse Communication: Mobility status  Activity Tolerance: Patient tolerated treatment well Patient left: in bed;with call bell/phone within reach;with nursing/sitter in room  OT Visit Diagnosis: Unsteadiness on feet (R26.81)                Time:  9449-6759 OT Time Calculation (min): 23 min Charges:  OT General Charges $OT Visit: 1 Visit OT Evaluation $OT Eval Moderate Complexity: 1 Mod OT Treatments $Therapeutic Activity: 8-22 mins  Eber Jones., OTR/L Acute Rehabilitation Services Pager [phone] Office [phone]   Jeani Hawking M 01/01/2021, 11:33 AM

## 2021-01-01 NOTE — Progress Notes (Signed)
PROGRESS NOTE  [name] EHO:[id number] DOB: 1940-02-18 DOA: 12/31/2020 PCP: Patient, No Pcp Per (Inactive)   LOS: 0 days   Brief Narrative / Interim history: Kevin Santiago is a 80 y.o. male with medical history significant for hypertension, hyperlipidemia who is admitted to [facility] on 12/31/2020 with acute ischemic strokeafter presenting from home to [facility] ED complaining of  left-sided numbness.   Subjective / 24h Interval events: Doing well this morning, weakness has improved however still reports poor balance.   Assessment & Plan: Principal Problem:   CVA (cerebral vascular accident) (HCC) Active Problems:   Hypertension   Hypercholesteremia   Anxiety   BPH (benign prostatic hyperplasia)   GERD (gastroesophageal reflux disease)   Principal Problem Acute CVA-he was admitted to the hospital with intermittent right-sided blurry vision/dysarthria as well as poor balance and left-sided numbness.  MRI of the brain showed an acute infarct in the left pons as well as a tiny acute infarct in the right posterior pons.  Neurology consulted and followed patient while hospitalized.  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  2D echo showed LVEF 60-65%, no WMA, normal diastolic parameters.  Lipid panel showed an LDL of 128, he has placed on atorvastatin 80.  Hemoglobin A1c was 5.1.  Therapies recommended outpatient PT/OT which is being arranged by case management. -d/w Dr Roda Shutters with neurology, planning for cerebral angiogram tomorrow for potential of the vertebral arteries and basilar artery. NPO after midnight  Active Problems Essential hypertension-allow permissive hypertension,  gradually resume antihypertensives in 3 to 5 days GAD-continue home medications BPH-continue home medications GERD-continue home medications  Scheduled Meds:   stroke: mapping our early stages of recovery book   Does not apply Once   aspirin  325 mg Oral Daily   aspirin  650 mg Oral Once   atorvastatin  80 mg Oral Daily   clopidogrel  300 mg Oral Once   clopidogrel  75 mg Oral Daily   sodium chloride flush  3 mL Intravenous Once   Continuous Infusions: PRN Meds:.acetaminophen **OR** acetaminophen  Diet Orders (From admission, onward)     Start     Ordered   01/01/21 0941  Diet regular Room service appropriate? Yes with Assist; Fluid consistency: Thin  Diet effective now       Question Answer Comment  Room service appropriate? Yes with Assist   Fluid consistency: Thin      01/01/21 0941            DVT prophylaxis: SCDs Start: 12/31/20 1958     Code Status: DNR  Family Communication: no family at bedside   Status is: Observation  The patient will require care spanning > 2 midnights and should be moved to inpatient because: cerebral angiogram tomorrow  Level of care: Telemetry Medical  Consultants:  Neurology   Procedures:  2D echo  Microbiology  none  Antimicrobials: none    Objective: Vitals:   01/01/21 0815 01/01/21 0900 01/01/21 0945 01/01/21 1050  BP: 123/67 (!) 105/56 (!) 172/97 (!) 141/80  Pulse: (!) 48 (!) 50 60 67  Resp: 11 16 19 16   Temp:    97.8 F (36.6 C)  TempSrc:    Oral  SpO2: 98% 96% 97% 100%  Weight:      Height:        Intake/Output Summary (Last 24 hours) at 01/01/2021 1355 Last data filed at 01/01/2021 1019 Gross per 24 hour  Intake 1000 ml  Output 800 ml  Net 200 ml   Filed Weights   12/31/20 2210  Weight: 79 kg    Examination:  Constitutional: NAD Eyes: no scleral icterus ENMT: Mucous membranes are moist.  Neck: normal, supple Respiratory: clear to auscultation bilaterally, no wheezing, no crackles. Normal  respiratory effort. No accessory muscle use.  Cardiovascular: Regular rate and rhythm, no murmurs / rubs / gallops. No LE edema. Good peripheral pulses Abdomen: non distended, no tenderness. Bowel sounds positive.  Musculoskeletal: no clubbing / cyanosis.  Skin: no rashes Neurologic: slurred speech present, no significant focal deficits   Data Reviewed: I have independently reviewed following labs and imaging studies   CBC: Recent Labs  Lab 12/31/20 0715 12/31/20 0730 01/01/21 0250  WBC 4.1  --  5.6  NEUTROABS 3.0  --   --   HGB 15.4 15.0 15.0  HCT 45.3 44.0 44.8  MCV 83.1  --  84.1  PLT 83*  --  92*   Basic Metabolic Panel: Recent Labs  Lab 12/31/20 0715 12/31/20 0730 01/01/21 0250  NA 138 141 137  K 3.9 3.9 3.9  CL 107 105 106  CO2 23  --  23  GLUCOSE 121* 119* 98  BUN CREATININE 1.33* 1.30* 1.20  CALCIUM 9.2  --  9.0  MG  --   --  2.1   Liver Function Tests: Recent Labs  Lab 12/31/20 0715 01/01/21 0250  AST 22 19  ALT 20 20  ALKPHOS 50 45  BILITOT 1.0 1.6*  PROT 6.6 6.2*  ALBUMIN 4.3 4.0   Coagulation Profile: Recent Labs  Lab 12/31/20 0715  INR 1.0   HbA1C: Recent Labs    01/01/21 0250  HGBA1C 5.1   CBG: Recent Labs  Lab 12/31/20 0723  GLUCAP 127*    Recent Results (from the past 240 hour(s))  Resp Panel by RT-PCR (Flu A&B, Covid) Nasopharyngeal Swab     Status: None   Collection Time: 01/01/21  9:23 AM   Specimen: Nasopharyngeal Swab; Nasopharyngeal(NP) swabs in vial transport medium  Result Value Ref Range Status   SARS Coronavirus 2 by RT PCR NEGATIVE NEGATIVE Final    Comment: (NOTE) SARS-CoV-2 target nucleic acids are NOT DETECTED.  The SARS-CoV-2 RNA is generally detectable in upper respiratory specimens during the acute phase of infection. The lowest concentration of SARS-CoV-2 viral copies this assay can detect is 138 copies/mL. A negative result does not preclude SARS-Cov-2 infection and should not be used as  the sole basis for treatment or other patient management decisions. A negative result may occur with  improper specimen collection/handling, submission of specimen other than nasopharyngeal swab, presence of viral mutation(s) within the areas targeted by this assay, and inadequate number of viral copies(<138 copies/mL). A negative result must be combined with clinical observations, patient history, and epidemiological information. The expected result is Negative.  Fact Sheet for Patients:  BloggerCourse.com  Fact Sheet for Healthcare Providers:  SeriousBroker.it  This test is no t yet approved or cleared by the [place] FDA and  has been authorized for detection and/or diagnosis of SARS-CoV-2 by FDA under an Emergency Use Authorization (EUA). This EUA will remain  in effect (meaning this test can be used) for the duration of the COVID-19 declaration under  Section 564(b)(1) of the Act, 21 U.S.C.section 360bbb-3(b)(1), unless the authorization is terminated  or revoked sooner.       Influenza A by PCR NEGATIVE NEGATIVE Final   Influenza B by PCR NEGATIVE NEGATIVE Final    Comment: (NOTE) The Xpert Xpress SARS-CoV-2/FLU/RSV plus assay is intended as an aid in the diagnosis of influenza from Nasopharyngeal swab specimens and should not be used as a sole basis for treatment. Nasal washings and aspirates are unacceptable for Xpert Xpress SARS-CoV-2/FLU/RSV testing.  Fact Sheet for Patients: BloggerCourse.com  Fact Sheet for Healthcare Providers: SeriousBroker.it  This test is not yet approved or cleared by the [place] FDA and has been authorized for detection and/or diagnosis of SARS-CoV-2 by FDA under an Emergency Use Authorization (EUA). This EUA will remain in effect (meaning this test can be used) for the duration of the COVID-19 declaration under Section 564(b)(1) of  the Act, 21 U.S.C. section 360bbb-3(b)(1), unless the authorization is terminated or revoked.  Performed at [facility], [address] [place], [place] [address]      Radiology Studies: CT ANGIO HEAD NECK W WO CM  Result Date: 12/31/2020 CLINICAL DATA:  Stroke/TIA.  Left-sided numbness and weakness. EXAM: CT ANGIOGRAPHY HEAD AND NECK TECHNIQUE: Multidetector CT imaging of the head and neck was performed using the standard protocol during bolus administration of intravenous contrast. Multiplanar CT image reconstructions and MIPs were obtained to evaluate the vascular anatomy. Carotid stenosis measurements (when applicable) are obtained utilizing NASCET criteria, using the distal internal carotid diameter as the denominator. CONTRAST:  75mL OMNIPAQUE IOHEXOL 350 MG/ML SOLN COMPARISON:  CT head 12/31/2020 FINDINGS: CTA NECK FINDINGS Aortic arch: Minimal atherosclerotic disease aortic arch. Proximal great vessels widely patent. Right carotid system: Mild atherosclerotic disease right carotid bifurcation without significant stenosis. Left carotid system: Mild atherosclerotic disease left carotid bifurcation without stenosis Vertebral arteries: Right vertebral artery dominant and widely patent Small left vertebral artery is occluded in the mid neck with faint reconstitution distally with very small vessel. Skeleton: ACDF C4 through C6. No definite solid fusion at C4-5. Posterior cerclage wires at C3-4. Posterior screw and plate fusion C2 through C4. No acute skeletal abnormality. Other neck: Left lower pole thyroid nodule 2.6 cm. Right lower pole thyroid nodule 11.8 cm. Recommend thyroid ultrasound (ref: J Am Coll Radiol. 2015 Feb;12(2): 143-50). Upper chest: Lung apices clear bilaterally. Review of the MIP images confirms the above findings CTA HEAD FINDINGS Anterior circulation: Mild atherosclerotic disease in the cavernous carotid bilaterally without stenosis. Anterior and middle cerebral arteries  widely patent bilaterally. 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 Right PICA is patent. Occlusion of the mid left vertebral artery with reconstitution distally. Left vertebral artery is small and diseased vessel which supplies left PICA and small contribution to the basilar. Atherosclerotic irregularity and moderate stenosis in the basilar artery. Posterior cerebral arteries patent bilaterally. Fetal origin right posterior cerebral artery. Venous sinuses: Normal venous enhancement Anatomic variants: None Review of the MIP images confirms the above findings IMPRESSION: 1. Negative for intracranial large vessel occlusion. 2. 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 3. Diffusely disease left vertebral artery which is small and occluded in the mid segment. Faint reconstitution distally. 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 Moderate stenosis basilar. 4. Bilateral thyroid nodules. Largest nodule on the left 2.6 cm. Recommend thyroid ultrasound. (Ref: J Am Coll Radiol. 2015 Feb;12(2):  143-50). Electronically Signed   By: Marlan Palau M.D.   On: 12/31/2020 18:09   MR BRAIN WO CONTRAST  Result Date: 12/31/2020 CLINICAL DATA:  Stroke/TIA.  Left-sided numbness and weakness. EXAM: MRI HEAD WITHOUT CONTRAST TECHNIQUE: Multiplanar, multiecho pulse sequences of the brain and surrounding structures were obtained without intravenous contrast. 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 Small area of acute infarct in the right posterior pons in the floor of the fourth ventricle. Ventricle size and cerebral volume normal. Negative for hemorrhage or mass. Normal white matter. Vascular: Normal arterial flow voids Skull and upper cervical spine: Negative Sinuses/Orbits: Mucosal edema paranasal sinuses.  Negative orbit  Other: None IMPRESSION: Acute infarct in the left pons. Tiny acute infarct in the right posterior pons. No significant chronic ischemia Electronically Signed   By: Marlan Palau M.D.   On: 12/31/2020 19:14   ECHOCARDIOGRAM COMPLETE  Result Date: 01/01/2021    ECHOCARDIOGRAM REPORT   Patient Name:   TODD JELINSKI [facility] Date of Exam: 01/01/2021 Medical Rec #:  [id number]     Height:       70.0 in Accession #:    [id number]    Weight:       174.2 lb Date of Birth:  [birth year]    BSA:          1.968 m Patient Age:    80 years      BP:           125/81 mmHg Patient Gender: M             HR:           49 bpm. Exam Location:  Inpatient Procedure: 2D Echo, Cardiac Doppler and Color Doppler Indications:    Stroke  History:        Patient has no prior history of Echocardiogram examinations.                 Risk Factors:Hypertension.  Sonographer:    Cleatis Polka Referring Phys: [id number] JUSTIN B HOWERTER IMPRESSIONS  1. Left ventricular ejection fraction, by estimation, is 60 to 65%. The left ventricle has normal function. The left ventricle has no regional wall motion abnormalities. There is mild left ventricular hypertrophy. Left ventricular diastolic parameters were normal.  2. Right ventricular systolic function is normal. The right ventricular size is normal. There is normal pulmonary artery systolic pressure.  3. The mitral valve is normal in structure. Trivial mitral valve regurgitation. No evidence of mitral stenosis.  4. The aortic valve is tricuspid. Aortic valve regurgitation is not visualized. Aortic valve sclerosis is present, with no evidence of aortic valve stenosis.  5. Aortic dilatation noted. There is mild dilatation of the ascending aorta, measuring 43 mm.  6. The inferior vena cava is dilated in size with >50% respiratory variability, suggesting right atrial pressure of 8 mmHg. Comparison(s): No prior Echocardiogram. FINDINGS  Left Ventricle: Left ventricular ejection fraction, by estimation, is 60 to 65%.  The left ventricle has normal function. The left ventricle has no regional wall motion abnormalities. The left ventricular internal cavity size was normal in size. There is  mild left ventricular hypertrophy. Left ventricular diastolic parameters were normal. Right Ventricle: The right ventricular size is normal. Right ventricular systolic function is normal. There is normal pulmonary artery systolic pressure. The tricuspid regurgitant velocity is 2.50 m/s, and with an assumed right atrial pressure of 8 mmHg,  the estimated right ventricular systolic pressure is 33.0 mmHg.  Left Atrium: Left atrial size was normal in size. Right Atrium: Right atrial size was normal in size. Pericardium: There is no evidence of pericardial effusion. Mitral Valve: The mitral valve is normal in structure. Trivial mitral valve regurgitation. No evidence of mitral valve stenosis. Tricuspid Valve: The tricuspid valve is normal in structure. Tricuspid valve regurgitation is mild . No evidence of tricuspid stenosis. Aortic Valve: The aortic valve is tricuspid. Aortic valve regurgitation is not visualized. Aortic valve sclerosis is present, with no evidence of aortic valve stenosis. Aortic valve peak gradient measures 6.0 mmHg. Pulmonic Valve: The pulmonic valve was normal in structure. Pulmonic valve regurgitation is trivial. No evidence of pulmonic stenosis. Aorta: Aortic dilatation noted. There is mild dilatation of the ascending aorta, measuring 43 mm. Venous: The inferior vena cava is dilated in size with greater than 50% respiratory variability, suggesting right atrial pressure of 8 mmHg. IAS/Shunts: No atrial level shunt detected by color flow Doppler.  LEFT VENTRICLE PLAX 2D LVIDd:         4.40 cm     Diastology LVIDs:         2.60 cm     LV e' medial:    7.83 cm/s LV PW:         1.20 cm     LV E/e' medial:  9.3 LV IVS:        1.20 cm     LV e' lateral:   8.81 cm/s LVOT diam:     2.00 cm     LV E/e' lateral: 8.3 LV SV:         78 LV  SV Index:   40 LVOT Area:     3.14 cm  LV Volumes (MOD) LV vol d, MOD A2C: 89.2 ml LV vol d, MOD A4C: 90.3 ml LV vol s, MOD A2C: 35.6 ml LV vol s, MOD A4C: 38.2 ml LV SV MOD A2C:     53.6 ml LV SV MOD A4C:     90.3 ml LV SV MOD BP:      52.4 ml RIGHT VENTRICLE             IVC RV Basal diam:  3.30 cm     IVC diam: 2.20 cm RV Mid diam:    2.40 cm RV S prime:     10.80 cm/s TAPSE (M-mode): 2.1 cm LEFT ATRIUM             Index        RIGHT ATRIUM           Index LA diam:        4.20 cm 2.13 cm/m   RA Area:     16.90 cm LA Vol (A2C):   42.2 ml 21.44 ml/m  RA Volume:   45.10 ml  22.91 ml/m LA Vol (A4C):   40.6 ml 20.63 ml/m LA Biplane Vol: 42.0 ml 21.34 ml/m  AORTIC VALVE AV Area (Vmax): 2.81 cm AV Vmax:        122.00 cm/s AV Peak Grad:   6.0 mmHg LVOT Vmax:      109.00 cm/s LVOT Vmean:     76.300 cm/s LVOT VTI:       0.249 m  AORTA Ao Root diam: 3.20 cm Ao Asc diam:  3.90 cm MITRAL VALVE               TRICUSPID VALVE MV Area (PHT): 3.53 cm    TR Peak grad:   25.0 mmHg MV Decel Time:  215 msec    TR Vmax:        250.00 cm/s MV E velocity: 73.10 cm/s MV A velocity: 74.40 cm/s  SHUNTS MV E/A ratio:  0.98        Systemic VTI:  0.25 m                            Systemic Diam: 2.00 cm Olga Millers MD Electronically signed by Olga Millers MD Signature Date/Time: 01/01/2021/11:51:11 AM    Final      Pamella Pert, MD, PhD [facility]  Between 7 am - 7 pm I am available, please contact me via Amion (for emergencies) or Securechat (non urgent messages)  Between 7 pm - 7 am I am not available, please contact night coverage MD/APP via Amion

## 2021-01-01 NOTE — Plan of Care (Signed)
Pt had a slight HA but after he ate it went away. Pain was a 1/10 for HA. Pt walked fine to the bathroom. SCD's are on. Tele is in place. Problem: Education: Goal: Knowledge of disease or condition will improve Outcome: Progressing Goal: Knowledge of secondary prevention will improve (SELECT ALL) Outcome: Progressing Goal: Knowledge of patient specific risk factors will improve (INDIVIDUALIZE FOR PATIENT) Outcome: Progressing Goal: Individualized Educational Video(s) Outcome: Progressing   Problem: Coping: Goal: Will verbalize positive feelings about self Outcome: Progressing Goal: Will identify appropriate support needs Outcome: Progressing   Problem: Health Behavior/Discharge Planning: Goal: Ability to manage health-related needs will improve Outcome: Progressing   Problem: Self-Care: Goal: Verbalization of feelings and concerns over difficulty with self-care will improve Outcome: Progressing Goal: Ability to communicate needs accurately will improve Outcome: Progressing   Problem: Nutrition: Goal: Risk of aspiration will decrease Outcome: Progressing   Problem: Ischemic Stroke/TIA Tissue Perfusion: Goal: Complications of ischemic stroke/TIA will be minimized Outcome: Progressing

## 2021-01-02 ENCOUNTER — Observation Stay (HOSPITAL_COMMUNITY): Payer: No Typology Code available for payment source | Admitting: Certified Registered Nurse Anesthetist

## 2021-01-02 ENCOUNTER — Other Ambulatory Visit (HOSPITAL_COMMUNITY): Payer: Self-pay

## 2021-01-02 ENCOUNTER — Encounter (HOSPITAL_COMMUNITY): Payer: Self-pay | Admitting: Internal Medicine

## 2021-01-02 ENCOUNTER — Inpatient Hospital Stay (HOSPITAL_COMMUNITY): Payer: No Typology Code available for payment source

## 2021-01-02 ENCOUNTER — Encounter (HOSPITAL_COMMUNITY)
Admission: EM | Disposition: A | Discharge status: Home / Self Care | Payer: Self-pay | Source: Home / Self Care | Attending: Internal Medicine

## 2021-01-02 DIAGNOSIS — Z91013 Allergy to seafood: Secondary | ICD-10-CM | POA: Diagnosis not present

## 2021-01-02 DIAGNOSIS — F32A Depression, unspecified: Secondary | ICD-10-CM | POA: Diagnosis not present

## 2021-01-02 DIAGNOSIS — F411 Generalized anxiety disorder: Secondary | ICD-10-CM | POA: Diagnosis not present

## 2021-01-02 DIAGNOSIS — R262 Difficulty in walking, not elsewhere classified: Secondary | ICD-10-CM | POA: Diagnosis present

## 2021-01-02 DIAGNOSIS — Z20822 Contact with and (suspected) exposure to covid-19: Secondary | ICD-10-CM | POA: Diagnosis not present

## 2021-01-02 DIAGNOSIS — N400 Enlarged prostate without lower urinary tract symptoms: Secondary | ICD-10-CM | POA: Diagnosis present

## 2021-01-02 DIAGNOSIS — Z79899 Other long term (current) drug therapy: Secondary | ICD-10-CM | POA: Diagnosis not present

## 2021-01-02 DIAGNOSIS — I358 Other nonrheumatic aortic valve disorders: Secondary | ICD-10-CM | POA: Diagnosis not present

## 2021-01-02 DIAGNOSIS — K219 Gastro-esophageal reflux disease without esophagitis: Secondary | ICD-10-CM | POA: Diagnosis not present

## 2021-01-02 DIAGNOSIS — I6329 Cerebral infarction due to unspecified occlusion or stenosis of other precerebral arteries: Secondary | ICD-10-CM | POA: Diagnosis not present

## 2021-01-02 DIAGNOSIS — I6322 Cerebral infarction due to unspecified occlusion or stenosis of basilar arteries: Secondary | ICD-10-CM

## 2021-01-02 DIAGNOSIS — I672 Cerebral atherosclerosis: Secondary | ICD-10-CM | POA: Diagnosis present

## 2021-01-02 DIAGNOSIS — Z7902 Long term (current) use of antithrombotics/antiplatelets: Secondary | ICD-10-CM | POA: Diagnosis not present

## 2021-01-02 DIAGNOSIS — Z8249 Family history of ischemic heart disease and other diseases of the circulatory system: Secondary | ICD-10-CM | POA: Diagnosis not present

## 2021-01-02 DIAGNOSIS — I69354 Hemiplegia and hemiparesis following cerebral infarction affecting left non-dominant side: Secondary | ICD-10-CM | POA: Diagnosis not present

## 2021-01-02 DIAGNOSIS — I639 Cerebral infarction, unspecified: Secondary | ICD-10-CM | POA: Diagnosis present

## 2021-01-02 DIAGNOSIS — R471 Dysarthria and anarthria: Secondary | ICD-10-CM | POA: Diagnosis present

## 2021-01-02 DIAGNOSIS — Z981 Arthrodesis status: Secondary | ICD-10-CM | POA: Diagnosis not present

## 2021-01-02 DIAGNOSIS — H532 Diplopia: Secondary | ICD-10-CM | POA: Diagnosis not present

## 2021-01-02 DIAGNOSIS — Z7982 Long term (current) use of aspirin: Secondary | ICD-10-CM | POA: Diagnosis not present

## 2021-01-02 DIAGNOSIS — Z66 Do not resuscitate: Secondary | ICD-10-CM | POA: Diagnosis not present

## 2021-01-02 DIAGNOSIS — R29703 NIHSS score 3: Secondary | ICD-10-CM | POA: Diagnosis present

## 2021-01-02 DIAGNOSIS — G936 Cerebral edema: Secondary | ICD-10-CM | POA: Diagnosis not present

## 2021-01-02 DIAGNOSIS — Z87891 Personal history of nicotine dependence: Secondary | ICD-10-CM | POA: Diagnosis not present

## 2021-01-02 DIAGNOSIS — I651 Occlusion and stenosis of basilar artery: Secondary | ICD-10-CM

## 2021-01-02 DIAGNOSIS — E7800 Pure hypercholesterolemia, unspecified: Secondary | ICD-10-CM | POA: Diagnosis not present

## 2021-01-02 DIAGNOSIS — I10 Essential (primary) hypertension: Secondary | ICD-10-CM | POA: Diagnosis not present

## 2021-01-02 HISTORY — PX: IR ANGIO VERTEBRAL SEL VERTEBRAL UNI R MOD SED: IMG5368

## 2021-01-02 HISTORY — PX: IR ANGIO VERTEBRAL SEL SUBCLAVIAN INNOMINATE UNI L MOD SED: IMG5364

## 2021-01-02 HISTORY — PX: IR ANGIO INTRA EXTRACRAN SEL INTERNAL CAROTID BILAT MOD SED: IMG5363

## 2021-01-02 HISTORY — PX: IR CT HEAD LTD: IMG2386

## 2021-01-02 HISTORY — PX: RADIOLOGY WITH ANESTHESIA: SHX6223

## 2021-01-02 HISTORY — PX: IR INTRA CRAN STENT: IMG2345

## 2021-01-02 HISTORY — PX: IR NEURO EACH ADD'L AFTER BASIC UNI RIGHT (MS): IMG5374

## 2021-01-02 LAB — POCT ACTIVATED CLOTTING TIME
Activated Clotting Time: 131 seconds
Activated Clotting Time: 197 seconds
Activated Clotting Time: 245 seconds
Activated Clotting Time: 263 seconds
Activated Clotting Time: 239 seconds
Activated Clotting Time: 251 seconds

## 2021-01-02 LAB — TYPE AND SCREEN
ABO/RH(D): A POS
Antibody Screen: NEGATIVE

## 2021-01-02 LAB — MRSA NEXT GEN BY PCR, NASAL: MRSA by PCR Next Gen: NOT DETECTED

## 2021-01-02 SURGERY — IR WITH ANESTHESIA
Anesthesia: General

## 2021-01-02 MED ORDER — OXYCODONE HCL 5 MG/5ML PO SOLN: 5.0000 mg | Freq: Once | ORAL | Status: DC | PRN

## 2021-01-02 MED ORDER — ROCURONIUM BROMIDE 10 MG/ML (PF) SYRINGE
PREFILLED_SYRINGE | INTRAVENOUS | Status: DC | PRN
  Administered 2021-01-02 (×2): 10 mg via INTRAVENOUS
  Administered 2021-01-02 (×2): 20 mg via INTRAVENOUS
  Administered 2021-01-02: 60 mg via INTRAVENOUS

## 2021-01-02 MED ORDER — SODIUM CHLORIDE 0.9 % IV SOLN: INTRAVENOUS | Status: DC

## 2021-01-02 MED ORDER — FENTANYL CITRATE (PF) 100 MCG/2ML IJ SOLN: 25.0000 ug | INTRAMUSCULAR | Status: DC | PRN

## 2021-01-02 MED ORDER — EPHEDRINE SULFATE-NACL 50-0.9 MG/10ML-% IV SOSY
PREFILLED_SYRINGE | INTRAVENOUS | Status: DC | PRN
  Administered 2021-01-02 (×5): 5 mg via INTRAVENOUS

## 2021-01-02 MED ORDER — ORAL CARE MOUTH RINSE: 15.0000 mL | Freq: Once | OROMUCOSAL | Status: AC

## 2021-01-02 MED ORDER — NITROGLYCERIN 1 MG/10 ML FOR IR/CATH LAB
INTRA_ARTERIAL | Status: AC
  Filled 2021-01-02: qty 10

## 2021-01-02 MED ORDER — HEPARIN SODIUM (PORCINE) 1000 UNIT/ML IJ SOLN
INTRAMUSCULAR | Status: AC
  Filled 2021-01-02: qty 10

## 2021-01-02 MED ORDER — ONDANSETRON HCL 4 MG/2ML IJ SOLN: 4.0000 mg | Freq: Four times a day (QID) | INTRAMUSCULAR | Status: DC | PRN

## 2021-01-02 MED ORDER — TICAGRELOR 90 MG PO TABS: 90.0000 mg | ORAL_TABLET | Freq: Two times a day (BID) | ORAL | Status: DC

## 2021-01-02 MED ORDER — HEPARIN SODIUM (PORCINE) 1000 UNIT/ML IJ SOLN
INTRAMUSCULAR | Status: DC | PRN
  Administered 2021-01-02: 2000 [IU] via INTRAVENOUS

## 2021-01-02 MED ORDER — ORAL CARE MOUTH RINSE: 15.0000 mL | Freq: Once | OROMUCOSAL | Status: DC

## 2021-01-02 MED ORDER — PHENYLEPHRINE HCL-NACL 20-0.9 MG/250ML-% IV SOLN
INTRAVENOUS | Status: DC | PRN
  Administered 2021-01-02: 25 ug/min via INTRAVENOUS

## 2021-01-02 MED ORDER — CHLORHEXIDINE GLUCONATE 0.12 % MT SOLN
15.0000 mL | Freq: Once | OROMUCOSAL | Status: AC
  Administered 2021-01-02: 15 mL via OROMUCOSAL

## 2021-01-02 MED ORDER — OXYCODONE HCL 5 MG PO TABS: 5.0000 mg | ORAL_TABLET | Freq: Once | ORAL | Status: DC | PRN

## 2021-01-02 MED ORDER — VERAPAMIL HCL 2.5 MG/ML IV SOLN
INTRAVENOUS | Status: AC
  Filled 2021-01-02: qty 2

## 2021-01-02 MED ORDER — CHLORHEXIDINE GLUCONATE 0.12 % MT SOLN
15.0000 mL | Freq: Once | OROMUCOSAL | Status: DC
  Filled 2021-01-02: qty 15

## 2021-01-02 MED ORDER — ACETAMINOPHEN 325 MG PO TABS
650.0000 mg | ORAL_TABLET | ORAL | Status: DC | PRN
  Administered 2021-01-03 (×2): 650 mg via ORAL
  Filled 2021-01-02: qty 2

## 2021-01-02 MED ORDER — ACETAMINOPHEN 160 MG/5ML PO SOLN: 650.0000 mg | ORAL | Status: DC | PRN

## 2021-01-02 MED ORDER — IOHEXOL 300 MG/ML  SOLN
100.0000 mL | Freq: Once | INTRAMUSCULAR | Status: AC | PRN
  Administered 2021-01-02: 48 mL via INTRA_ARTERIAL

## 2021-01-02 MED ORDER — LIDOCAINE HCL 1 % IJ SOLN
INTRAMUSCULAR | Status: AC | PRN
  Administered 2021-01-02: 1 mL via INTRADERMAL

## 2021-01-02 MED ORDER — SUGAMMADEX SODIUM 200 MG/2ML IV SOLN
INTRAVENOUS | Status: DC | PRN
  Administered 2021-01-02: 300 mg via INTRAVENOUS

## 2021-01-02 MED ORDER — LACTATED RINGERS IV SOLN: INTRAVENOUS | Status: DC

## 2021-01-02 MED ORDER — DEXAMETHASONE SODIUM PHOSPHATE 10 MG/ML IJ SOLN
INTRAMUSCULAR | Status: DC | PRN
  Administered 2021-01-02: 5 mg via INTRAVENOUS

## 2021-01-02 MED ORDER — SODIUM CHLORIDE 0.9 % IV SOLN: INTRAVENOUS | Status: DC | PRN

## 2021-01-02 MED ORDER — FENTANYL CITRATE (PF) 100 MCG/2ML IJ SOLN
INTRAMUSCULAR | Status: DC | PRN
  Administered 2021-01-02 (×2): 50 ug via INTRAVENOUS

## 2021-01-02 MED ORDER — ONDANSETRON HCL 4 MG/2ML IJ SOLN: 4.0000 mg | Freq: Once | INTRAMUSCULAR | Status: DC | PRN

## 2021-01-02 MED ORDER — CLEVIDIPINE BUTYRATE 0.5 MG/ML IV EMUL
INTRAVENOUS | Status: AC
  Filled 2021-01-02: qty 50

## 2021-01-02 MED ORDER — ACETAMINOPHEN 650 MG RE SUPP: 650.0000 mg | RECTAL | Status: DC | PRN

## 2021-01-02 MED ORDER — CLEVIDIPINE BUTYRATE 0.5 MG/ML IV EMUL
0.0000 mg/h | INTRAVENOUS | Status: DC
  Administered 2021-01-02 (×2): 2 mg/h via INTRAVENOUS
  Filled 2021-01-02: qty 50

## 2021-01-02 MED ORDER — PROPOFOL 10 MG/ML IV BOLUS
INTRAVENOUS | Status: DC | PRN
  Administered 2021-01-02: 150 mg via INTRAVENOUS
  Administered 2021-01-02: 50 mg via INTRAVENOUS

## 2021-01-02 MED ORDER — LACTATED RINGERS IV SOLN: INTRAVENOUS | Status: DC | PRN

## 2021-01-02 MED ORDER — CHLORHEXIDINE GLUCONATE CLOTH 2 % EX PADS
6.0000 | MEDICATED_PAD | Freq: Every day | CUTANEOUS | Status: DC
  Administered 2021-01-02 – 2021-01-03 (×2): 6 via TOPICAL

## 2021-01-02 MED ORDER — PHENYLEPHRINE 40 MCG/ML (10ML) SYRINGE FOR IV PUSH (FOR BLOOD PRESSURE SUPPORT)
PREFILLED_SYRINGE | INTRAVENOUS | Status: DC | PRN
  Administered 2021-01-02: 80 ug via INTRAVENOUS
  Administered 2021-01-02: 40 ug via INTRAVENOUS
  Administered 2021-01-02 (×5): 80 ug via INTRAVENOUS

## 2021-01-02 MED ORDER — TICAGRELOR 90 MG PO TABS
90.0000 mg | ORAL_TABLET | Freq: Two times a day (BID) | ORAL | Status: DC
  Administered 2021-01-02 – 2021-01-04 (×4): 90 mg via ORAL
  Filled 2021-01-02 (×4): qty 1

## 2021-01-02 MED ORDER — LIDOCAINE 2% (20 MG/ML) 5 ML SYRINGE
INTRAMUSCULAR | Status: DC | PRN
  Administered 2021-01-02: 60 mg via INTRAVENOUS

## 2021-01-02 MED ORDER — ASPIRIN 81 MG PO CHEW
81.0000 mg | CHEWABLE_TABLET | Freq: Every day | ORAL | Status: DC
  Administered 2021-01-03 – 2021-01-04 (×2): 81 mg via ORAL
  Filled 2021-01-02 (×2): qty 1

## 2021-01-02 MED ORDER — VERAPAMIL HCL 2.5 MG/ML IV SOLN
INTRA_ARTERIAL | Status: AC | PRN
  Administered 2021-01-02: 15 mL via INTRA_ARTERIAL

## 2021-01-02 MED ORDER — ONDANSETRON HCL 4 MG/2ML IJ SOLN
INTRAMUSCULAR | Status: DC | PRN
  Administered 2021-01-02: 4 mg via INTRAVENOUS

## 2021-01-02 MED ORDER — ASPIRIN 81 MG PO CHEW: 81.0000 mg | CHEWABLE_TABLET | Freq: Every day | ORAL | Status: DC

## 2021-01-02 MED ORDER — LIDOCAINE HCL 1 % IJ SOLN
INTRAMUSCULAR | Status: AC
  Filled 2021-01-02: qty 20

## 2021-01-02 NOTE — Transfer of Care (Signed)
Immediate Anesthesia Transfer of Care Note  Patient: Kevin Santiago  Procedure(s) Performed: IR WITH ANESTHESIA  Patient Location: PACU  Anesthesia Type:General  Level of Consciousness: awake, alert  and patient cooperative  Airway & Oxygen Therapy: Patient Spontanous Breathing  Post-op Assessment: Report given to RN and Post -op Vital signs reviewed and stable  Post vital signs: Reviewed and stable  Last Vitals:  Vitals Value Taken Time  BP 133/66 01/02/21 1643  Temp    Pulse 74 01/02/21 1645  Resp 19 01/02/21 1645  SpO2 93 % 01/02/21 1645  Vitals shown include unvalidated device data.  Last Pain:  Vitals:   01/02/21 1234  TempSrc: Oral  PainSc:       Patients Stated Pain Goal: 4 (01/01/21 1225)  Complications: No notable events documented.

## 2021-01-02 NOTE — Progress Notes (Signed)
PT Cancellation Note  Patient Details Name: Kevin Santiago MRN: [id number] DOB: 19-Dec-1940   Cancelled Treatment:    Reason Eval/Treat Not Completed: Patient at procedure or test/unavailable today as he is off unit for basilar artery stent placement. Will continue to follow and progress established POC as time/schedule allow.   Vickki Muff, PT, DPT   Acute Rehabilitation Department Pager #: [phone]    Ronnie Derby 01/02/2021, 3:55 PM

## 2021-01-02 NOTE — Anesthesia Procedure Notes (Signed)
Procedure Name: Intubation Date/Time: 01/02/2021 1:15 PM Performed by: Nils Pyle, CRNA Pre-anesthesia Checklist: Patient identified, Emergency Drugs available, Suction available and Patient being monitored Patient Re-evaluated:Patient Re-evaluated prior to induction Oxygen Delivery Method: Circle System Utilized Preoxygenation: Pre-oxygenation with 100% oxygen Induction Type: IV induction Ventilation: Mask ventilation without difficulty Laryngoscope Size: Glidescope and 4 Grade View: Grade I Tube type: Oral Tube size: 7.5 mm Number of attempts: 1 Airway Equipment and Method: Stylet and Oral airway Placement Confirmation: ETT inserted through vocal cords under direct vision, positive ETCO2 and breath sounds checked- equal and bilateral Secured at: 23 cm Tube secured with: Tape Dental Injury: Teeth and Oropharynx as per pre-operative assessment

## 2021-01-02 NOTE — Progress Notes (Signed)
STROKE TEAM PROGRESS NOTE   SUBJECTIVE (INTERVAL HISTORY) No family is at the bedside.  Patient just arrived to 4 N. after procedure.  Patient awake alert, moving all extremities, cranial nerve intact except that mild subjective diplopia with left gaze.  No disconjugate gaze on exam.  Patient stated that he had mild diplopia 2 days ago but that resolved yesterday and before procedure.  Post procedure he had again mild diplopia more with left gaze.  Otherwise, patient has no complaints.  BP stable on low-dose Cleviprex, BP goal 1 20-1 40 for 24 hours postprocedure.   OBJECTIVE Temp:  [97.5 F (36.4 C)-98.1 F (36.7 C)] 98.1 F (36.7 C) (12/02 1645) Pulse Rate:  [54-73] 65 (12/02 1930) Cardiac Rhythm: Normal sinus rhythm (12/02 1930) Resp:  [15-20] 20 (12/02 1930) BP: (112-168)/(52-91) 127/74 (12/02 1930) SpO2:  [92 %-99 %] 94 % (12/02 1930) Arterial Line BP: (122-161)/(47-61) 149/59 (12/02 1930)  Recent Labs  Lab 12/31/20 0723  GLUCAP 127*   Recent Labs  Lab 12/31/20 0715 12/31/20 0730 01/01/21 0250  NA 138 141 137  K 3.9 3.9 3.9  CL 107 105 106  CO2 23  --  23  GLUCOSE 121* 119* 98  BUN CREATININE 1.33* 1.30* 1.20  CALCIUM 9.2  --  9.0  MG  --   --  2.1   Recent Labs  Lab 12/31/20 0715 01/01/21 0250  AST 22 19  ALT 20 20  ALKPHOS 50 45  BILITOT 1.0 1.6*  PROT 6.6 6.2*  ALBUMIN 4.3 4.0   Recent Labs  Lab 12/31/20 0715 12/31/20 0730 01/01/21 0250  WBC 4.1  --  5.6  NEUTROABS 3.0  --   --   HGB 15.4 15.0 15.0  HCT 45.3 44.0 44.8  MCV 83.1  --  84.1  PLT 83*  --  92*   No results for input(s): CKTOTAL, CKMB, CKMBINDEX, TROPONINI in the last 168 hours. Recent Labs    12/31/20 0715  LABPROT 13.4  INR 1.0   No results for input(s): COLORURINE, LABSPEC, PHURINE, GLUCOSEU, HGBUR, BILIRUBINUR, KETONESUR, PROTEINUR, UROBILINOGEN, NITRITE, LEUKOCYTESUR in the last 72 hours.  Invalid input(s): APPERANCEUR     Component Value Date/Time   CHOL 184  01/01/2021 0250   TRIG 121 01/01/2021 0250   HDL 32 (L) 01/01/2021 0250   CHOLHDL 5.8 01/01/2021 0250   VLDL 24 01/01/2021 0250   LDLCALC 128 (H) 01/01/2021 0250   Lab Results  Component Value Date   HGBA1C 5.1 01/01/2021   No results found for: LABOPIA, COCAINSCRNUR, LABBENZ, AMPHETMU, THCU, LABBARB  No results for input(s): ETH in the last 168 hours.  I have personally reviewed the radiological images below and agree with the radiology interpretations.  CT ANGIO HEAD NECK W WO CM  Result Date: 12/31/2020 CLINICAL DATA:  Stroke/TIA.  Left-sided numbness and weakness. EXAM: CT ANGIOGRAPHY HEAD AND NECK TECHNIQUE: Multidetector CT imaging of the head and neck was performed using the standard protocol during bolus administration of intravenous contrast. Multiplanar CT image reconstructions and MIPs were obtained to evaluate the vascular anatomy. Carotid stenosis measurements (when applicable) are obtained utilizing NASCET criteria, using the distal internal carotid diameter as the denominator. CONTRAST:  75mL OMNIPAQUE IOHEXOL 350 MG/ML SOLN COMPARISON:  CT head 12/31/2020 FINDINGS: CTA NECK FINDINGS Aortic arch: Minimal atherosclerotic disease aortic arch. Proximal great vessels widely patent. Right carotid system: Mild atherosclerotic disease right carotid bifurcation without significant stenosis. Left carotid system: Mild atherosclerotic disease left carotid bifurcation  without stenosis Vertebral arteries: Right vertebral artery dominant and widely patent Small left vertebral artery is occluded in the mid neck with faint reconstitution distally with very small vessel. Skeleton: ACDF C4 through C6. No definite solid fusion at C4-5. Posterior cerclage wires at C3-4. Posterior screw and plate fusion C2 through C4. No acute skeletal abnormality. Other neck: Left lower pole thyroid nodule 2.6 cm. Right lower pole thyroid nodule 11.8 cm. Recommend thyroid ultrasound (ref: J Am Coll Radiol. 2015  Feb;12(2): 143-50). Upper chest: Lung apices clear bilaterally. Review of the MIP images confirms the above findings CTA HEAD FINDINGS Anterior circulation: Mild atherosclerotic disease in the cavernous carotid bilaterally without stenosis. Anterior and middle cerebral arteries widely patent bilaterally. 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 Right PICA is patent. Occlusion of the mid left vertebral artery with reconstitution distally. Left vertebral artery is small and diseased vessel which supplies left PICA and small contribution to the basilar. Atherosclerotic irregularity and moderate stenosis in the basilar artery. Posterior cerebral arteries patent bilaterally. Fetal origin right posterior cerebral artery. Venous sinuses: Normal venous enhancement Anatomic variants: None Review of the MIP images confirms the above findings IMPRESSION: 1. Negative for intracranial large vessel occlusion. 2. 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 3. Diffusely disease left vertebral artery which is small and occluded in the mid segment. Faint reconstitution distally. 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 Moderate stenosis basilar. 4. Bilateral thyroid nodules. Largest nodule on the left 2.6 cm. Recommend thyroid ultrasound. (Ref: J Am Coll Radiol. 2015 Feb;12(2): 143-50). Electronically Signed   By: Marlan Palau M.D.   On: 12/31/2020 18:09   CT HEAD WO CONTRAST  Result Date: 12/31/2020 CLINICAL DATA:  Neuro deficit, acute, stroke suspected EXAM: CT HEAD WITHOUT CONTRAST TECHNIQUE: Contiguous axial images were obtained from the base of the skull through the vertex without intravenous contrast. COMPARISON:  2015 FINDINGS: Brain: There is no acute intracranial hemorrhage, mass effect, or edema. Gray-white differentiation is preserved. There is no  extra-axial fluid collection. Ventricles and sulci are within normal limits in size and configuration. Vascular: There is atherosclerotic calcification at the skull base. Skull: Calvarium is unremarkable. Sinuses/Orbits: Paranasal sinus mucosal thickening. Orbits are unremarkable. Other: None. IMPRESSION: No acute intracranial hemorrhage or evidence of acute infarction. Electronically Signed   By: Guadlupe Spanish M.D.   On: 12/31/2020 08:15   MR BRAIN WO CONTRAST  Result Date: 12/31/2020 CLINICAL DATA:  Stroke/TIA.  Left-sided numbness and weakness. EXAM: MRI HEAD WITHOUT CONTRAST TECHNIQUE: Multiplanar, multiecho pulse sequences of the brain and surrounding structures were obtained without intravenous contrast. 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 Small area of acute infarct in the right posterior pons in the floor of the fourth ventricle. Ventricle size and cerebral volume normal. Negative for hemorrhage or mass. Normal white matter. Vascular: Normal arterial flow voids Skull and upper cervical spine: Negative Sinuses/Orbits: Mucosal edema paranasal sinuses.  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 Tiny acute infarct in the right posterior pons. No significant chronic ischemia Electronically Signed   By: Marlan Palau M.D.   On: 12/31/2020 19:14   ECHOCARDIOGRAM COMPLETE  Result Date: 01/01/2021    ECHOCARDIOGRAM REPORT   Patient Name:   HAO DION [facility] Date of Exam: 01/01/2021 Medical Rec #:  [id number]     Height:  70.0 in Accession #:    [id number]    Weight:       174.2 lb Date of Birth:  [birth year]    BSA:          1.968 m Patient Age:    80 years      BP:           125/81 mmHg Patient Gender: M             HR:           49 bpm. Exam Location:  Inpatient Procedure: 2D Echo, Cardiac Doppler and Color Doppler Indications:    Stroke  History:        Patient has no prior history of Echocardiogram examinations.                  Risk Factors:Hypertension.  Sonographer:    Cleatis Polka Referring Phys: [id number] JUSTIN B HOWERTER IMPRESSIONS  1. Left ventricular ejection fraction, by estimation, is 60 to 65%. The left ventricle has normal function. The left ventricle has no regional wall motion abnormalities. There is mild left ventricular hypertrophy. Left ventricular diastolic parameters were normal.  2. Right ventricular systolic function is normal. The right ventricular size is normal. There is normal pulmonary artery systolic pressure.  3. The mitral valve is normal in structure. Trivial mitral valve regurgitation. No evidence of mitral stenosis.  4. The aortic valve is tricuspid. Aortic valve regurgitation is not visualized. Aortic valve sclerosis is present, with no evidence of aortic valve stenosis.  5. Aortic dilatation noted. There is mild dilatation of the ascending aorta, measuring 43 mm.  6. The inferior vena cava is dilated in size with >50% respiratory variability, suggesting right atrial pressure of 8 mmHg. Comparison(s): No prior Echocardiogram. FINDINGS  Left Ventricle: Left ventricular ejection fraction, by estimation, is 60 to 65%. The left ventricle has normal function. The left ventricle has no regional wall motion abnormalities. The left ventricular internal cavity size was normal in size. There is  mild left ventricular hypertrophy. Left ventricular diastolic parameters were normal. Right Ventricle: The right ventricular size is normal. Right ventricular systolic function is normal. There is normal pulmonary artery systolic pressure. The tricuspid regurgitant velocity is 2.50 m/s, and with an assumed right atrial pressure of 8 mmHg,  the estimated right ventricular systolic pressure is 33.0 mmHg. Left Atrium: Left atrial size was normal in size. Right Atrium: Right atrial size was normal in size. Pericardium: There is no evidence of pericardial effusion. Mitral Valve: The mitral valve is normal in structure. Trivial  mitral valve regurgitation. No evidence of mitral valve stenosis. Tricuspid Valve: The tricuspid valve is normal in structure. Tricuspid valve regurgitation is mild . No evidence of tricuspid stenosis. Aortic Valve: The aortic valve is tricuspid. Aortic valve regurgitation is not visualized. Aortic valve sclerosis is present, with no evidence of aortic valve stenosis. Aortic valve peak gradient measures 6.0 mmHg. Pulmonic Valve: The pulmonic valve was normal in structure. Pulmonic valve regurgitation is trivial. No evidence of pulmonic stenosis. Aorta: Aortic dilatation noted. There is mild dilatation of the ascending aorta, measuring 43 mm. Venous: The inferior vena cava is dilated in size with greater than 50% respiratory variability, suggesting right atrial pressure of 8 mmHg. IAS/Shunts: No atrial level shunt detected by color flow Doppler.  LEFT VENTRICLE PLAX 2D LVIDd:         4.40 cm     Diastology LVIDs:         2.60 cm  LV e' medial:    7.83 cm/s LV PW:         1.20 cm     LV E/e' medial:  9.3 LV IVS:        1.20 cm     LV e' lateral:   8.81 cm/s LVOT diam:     2.00 cm     LV E/e' lateral: 8.3 LV SV:         78 LV SV Index:   40 LVOT Area:     3.14 cm  LV Volumes (MOD) LV vol d, MOD A2C: 89.2 ml LV vol d, MOD A4C: 90.3 ml LV vol s, MOD A2C: 35.6 ml LV vol s, MOD A4C: 38.2 ml LV SV MOD A2C:     53.6 ml LV SV MOD A4C:     90.3 ml LV SV MOD BP:      52.4 ml RIGHT VENTRICLE             IVC RV Basal diam:  3.30 cm     IVC diam: 2.20 cm RV Mid diam:    2.40 cm RV S prime:     10.80 cm/s TAPSE (M-mode): 2.1 cm LEFT ATRIUM             Index        RIGHT ATRIUM           Index LA diam:        4.20 cm 2.13 cm/m   RA Area:     16.90 cm LA Vol (A2C):   42.2 ml 21.44 ml/m  RA Volume:   45.10 ml  22.91 ml/m LA Vol (A4C):   40.6 ml 20.63 ml/m LA Biplane Vol: 42.0 ml 21.34 ml/m  AORTIC VALVE AV Area (Vmax): 2.81 cm AV Vmax:        122.00 cm/s AV Peak Grad:   6.0 mmHg LVOT Vmax:      109.00 cm/s LVOT Vmean:      76.300 cm/s LVOT VTI:       0.249 m  AORTA Ao Root diam: 3.20 cm Ao Asc diam:  3.90 cm MITRAL VALVE               TRICUSPID VALVE MV Area (PHT): 3.53 cm    TR Peak grad:   25.0 mmHg MV Decel Time: 215 msec    TR Vmax:        250.00 cm/s MV E velocity: 73.10 cm/s MV A velocity: 74.40 cm/s  SHUNTS MV E/A ratio:  0.98        Systemic VTI:  0.25 m                            Systemic Diam: 2.00 cm Olga Millers MD Electronically signed by Olga Millers MD Signature Date/Time: 01/01/2021/11:51:11 AM    Final       PHYSICAL EXAM  Temp:  [97.5 F (36.4 C)-98.1 F (36.7 C)] 98.1 F (36.7 C) (12/02 1645) Pulse Rate:  [54-73] 65 (12/02 1930) Resp:  [15-20] 20 (12/02 1930) BP: (112-168)/(52-91) 127/74 (12/02 1930) SpO2:  [92 %-99 %] 94 % (12/02 1930) Arterial Line BP: (122-161)/(47-61) 149/59 (12/02 1930)  General - Well nourished, well developed, in no apparent distress.  Ophthalmologic - fundi not visualized due to noncooperation.  Cardiovascular - Regular rhythm and rate.  Mental Status -  Level of arousal and orientation to time, place, and person were intact. Language including expression, naming,  repetition, comprehension was assessed and found intact. Fund of Knowledge was assessed and was intact.  Cranial Nerves II - XII - II - Visual field intact OU. III, IV, VI - Extraocular movements intact.  Subjective mild diplopia on the left gaze V - Facial sensation intact bilaterally. VII - Facial movement intact bilaterally. VIII - Hearing & vestibular intact bilaterally. X - Palate elevates symmetrically. XI - Chin turning & shoulder shrug intact bilaterally. XII - Tongue protrusion intact.  Motor Strength - The patient's strength was normal in all extremities and pronator drift was absent.  Bulk was normal and fasciculations were absent.   Motor Tone - Muscle tone was assessed at the neck and appendages and was normal.  Reflexes - The patient's reflexes were symmetrical in all  extremities and he had no pathological reflexes.  Sensory - Light touch, temperature/pinprick were assessed and were symmetrical.    Coordination - The patient had normal movements in the hands and left foot with no ataxia or dysmetria.  Slight dysmetria at right HTS. Tremor was absent.  Gait and Station - deferred.   ASSESSMENT/PLAN Mr. ORLO BRICKLE is a 80 y.o. male with history of hypertension, hyperlipidemia, depression admitted for mild double vision, imbalance, left-sided numbness and dysarthria. No tPA given due to outside window.    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 CT no acute finding CT head and neck mid basilar arteries severe stenosis, right VA tandem stenosis, L VA occlusion MRI left mid pontine infarct, right ventral pontine punctate infarct. 2D Echo EF 60 to 65% LDL 128 HgbA1c 5.1 SCDs for VTE prophylaxis No antithrombotic prior to admission, now on aspirin 81 mg daily and Brilinta (ticagrelor) 90 mg bid after loading. Patient counseled to be compliant with his antithrombotic medications Ongoing aggressive stroke risk factor management Therapy recommendations: Outpatient PT Disposition: Pending  Posterior circulation severe stenosis CT head and neck mid basilar arteries severe stenosis, right VA tandem stenosis, L  VA occlusion Likely the cause of current stroke S/p angiogram and R VA and BA stenting today with Dr. Sherlon Handing  On aspirin and Brilinta   Hypertension Stable Avoid low BP BP goal 120-164 24h post intervention Long term BP goal normotensive after   Hyperlipidemia Home meds: Pravastatin 40 LDL 128, goal < 70 Now on Lipitor 80 Continue statin at discharge  Other Stroke Risk Factors Advanced age  Other Active Problems Depression on [facility]  Hospital day # 0  I discussed with Dr. Sherlon Handing interventional neuroradiology. I spent  35 minutes in total  face-to-face time with the patient, more than 50% of which was spent in counseling and coordination of care, reviewing test results, images and medication, and discussing the diagnosis, treatment plan and potential prognosis. This patient's care requiresreview of multiple databases, neurological assessment, discussion with family, other specialists and medical decision making of high complexity.  Marvel Plan, MD PhD Stroke Neurology 01/02/2021 8:27 PM    To contact Stroke Continuity provider, please refer to [email or web address]. After hours, contact General Neurology

## 2021-01-02 NOTE — Anesthesia Preprocedure Evaluation (Addendum)
Anesthesia Evaluation  Patient identified by MRN, date of birth, ID band Patient awake    Reviewed: Allergy & Precautions, NPO status , Patient's Chart, lab work & pertinent test results  Airway Mallampati: III  TM Distance: >3 FB Neck ROM: Limited    Dental  (+) Dental Advisory Given, Partial Lower   Pulmonary former smoker,    Pulmonary exam normal        Cardiovascular hypertension, Pt. on medications Normal cardiovascular exam   '22 TTE - EF 60 to 65%. Mild left ventricular hypertrophy. Trivial mitral valve regurgitation. There is mild dilatation of the ascending aorta, measuring 43 mm.     Neuro/Psych PSYCHIATRIC DISORDERS Anxiety Depression CVA (12/31/20), No Residual Symptoms    GI/Hepatic Neg liver ROS, GERD  Medicated and Controlled,  Endo/Other   Pre-DM   Renal/GU      Musculoskeletal negative musculoskeletal ROS (+)   Abdominal   Peds  Hematology  (+) anemia ,  Plt 92k    Anesthesia Other Findings   Reproductive/Obstetrics                           Anesthesia Physical Anesthesia Plan  ASA: 4  Anesthesia Plan: General   Post-op Pain Management: Minimal or no pain anticipated   Induction: Intravenous  PONV Risk Score and Plan: 2 and Ondansetron, Dexamethasone and Treatment may vary due to age or medical condition  Airway Management Planned: Mask and Oral ETT  Additional Equipment:   Intra-op Plan:   Post-operative Plan: Extubation in OR  Informed Consent: I have reviewed the patients History and Physical, chart, labs and discussed the procedure including the risks, benefits and alternatives for the proposed anesthesia with the patient or authorized representative who has indicated his/her understanding and acceptance.   Patient has DNR.  Discussed DNR with patient and Suspend DNR.   Dental advisory given  Plan Discussed with: CRNA and  Anesthesiologist  Anesthesia Plan Comments: (A-line discussed with patient, will place intraop if necessary )      Anesthesia Quick Evaluation

## 2021-01-02 NOTE — Progress Notes (Signed)
PROGRESS NOTE  Kevin Santiago QIW:[id number] DOB: [birth year] DOA: 12/31/2020 PCP: Patient, No Pcp Per (Inactive)   LOS: 0 days   Brief Narrative / Interim history: Kevin Santiago is a 80 y.o. male with medical history significant for hypertension, hyperlipidemia who is admitted to [facility] on 12/31/2020 with acute ischemic strokeafter presenting from home to [facility] ED complaining of  left-sided numbness.   Subjective / 24h Interval events: No complaints, awaiting cerebral angiogram  Assessment & Plan: Principal Problem:   CVA (cerebral vascular accident) (HCC) Active Problems:   Hypertension   Hypercholesteremia   Anxiety   BPH (benign prostatic hyperplasia)   GERD (gastroesophageal reflux disease)   Stroke (cerebrum) (HCC)   Principal Problem Acute CVA-he was admitted to the hospital with intermittent right-sided blurry vision/dysarthria as well as poor balance and left-sided numbness.  MRI of the brain showed an acute infarct in the left pons as well as a tiny acute infarct in the right posterior pons.  Neurology consulted and followed patient while hospitalized.  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  2D echo showed LVEF 60-65%, no WMA, normal diastolic parameters.  Lipid panel showed an LDL of 128, he has placed on atorvastatin 80.  Hemoglobin A1c was 5.1.  Therapies recommended outpatient PT/OT which is being arranged by case management. -d/w Dr Roda Shutters with neurology, cerebral angiogram today for potential stenting of the vertebral arteries and basilar artery. Will follow results later today   Active Problems Essential hypertension-allow permissive hypertension, gradually  resume antihypertensives in 3 to 5 days GAD-continue home medications BPH-continue home medications GERD-continue home medications  Scheduled Meds:   stroke: mapping our early stages of recovery book   Does not apply Once   aspirin EC  81 mg Oral Daily   atorvastatin  80 mg Oral Daily   cholecalciferol  2,000 Units Oral Daily   pantoprazole  40 mg Oral Daily   sertraline  25 mg Oral QHS   sodium chloride flush  3 mL Intravenous Once   tamsulosin  0.4 mg Oral QHS   ticagrelor  90 mg Oral BID   Continuous Infusions:  sodium chloride     PRN Meds:.acetaminophen **OR** acetaminophen  Diet Orders (From admission, onward)     Start     Ordered   01/02/21 0001  Diet NPO time specified Except for: Sips with Meds  Diet effective midnight       Question:  Except for  Answer:  Sips with Meds   01/01/21 1702            DVT prophylaxis: SCDs Start: 12/31/20 1958     Code Status: DNR  Family Communication: no family at bedside   Status is: Observation  The patient will require care spanning > 2 midnights and should be moved to inpatient because: cerebral angiogram tomorrow  Level of care: Telemetry Medical  Consultants:  Neurology   Procedures:  2D echo  Microbiology  none  Antimicrobials: none    Objective: Vitals:   01/01/21 2007 01/01/21 2357 01/02/21 0334 01/02/21 1106  BP: 134/81 136/68 (!) 112/91 (!) 154/78  Pulse: (!) 58 (!) 54 (!) 56 (!) 54  Resp: 18  18 18   Temp: 97.7 F (36.5 C) 98  F (36.7 C) (!) 97.5 F (36.4 C) 97.9 F (36.6 C)  TempSrc: Oral Oral Oral Oral  SpO2: 97% 97% 96% 99%  Weight:      Height:        Intake/Output Summary (Last 24 hours) at 01/02/2021 1131 Last data filed at 01/02/2021 0355 Gross per 24 hour  Intake 480 ml  Output 900 ml  Net -420 ml    Filed Weights   12/31/20 2210  Weight: 79 kg    Examination:  Constitutional: nad Eyes: anicteric ENMT: mmm Neck: normal, supple Respiratory: cta biL, no wheezing, no  crackles Cardiovascular: rrr, no mrg, no edema Abdomen: soft, nt, nd, bs+ Musculoskeletal: no clubbing / cyanosis.  Skin: no rashes Neurologic: slurred speech present, no new focal deficits   Data Reviewed: I have independently reviewed following labs and imaging studies   CBC: Recent Labs  Lab 12/31/20 0715 12/31/20 0730 01/01/21 0250  WBC 4.1  --  5.6  NEUTROABS 3.0  --   --   HGB 15.4 15.0 15.0  HCT 45.3 44.0 44.8  MCV 83.1  --  84.1  PLT 83*  --  92*    Basic Metabolic Panel: Recent Labs  Lab 12/31/20 0715 12/31/20 0730 01/01/21 0250  NA 138 141 137  K 3.9 3.9 3.9  CL 107 105 106  CO2 23  --  23  GLUCOSE 121* 119* 98  BUN 17 18 13   CREATININE 1.33* 1.30* 1.20  CALCIUM 9.2  --  9.0  MG  --   --  2.1    Liver Function Tests: Recent Labs  Lab 12/31/20 0715 01/01/21 0250  AST 22 19  ALT 20 20  ALKPHOS 50 45  BILITOT 1.0 1.6*  PROT 6.6 6.2*  ALBUMIN 4.3 4.0    Coagulation Profile: Recent Labs  Lab 12/31/20 0715  INR 1.0    HbA1C: Recent Labs    01/01/21 0250  HGBA1C 5.1    CBG: Recent Labs  Lab 12/31/20 0723  GLUCAP 127*     Recent Results (from the past 240 hour(s))  Resp Panel by RT-PCR (Flu A&B, Covid) Nasopharyngeal Swab     Status: None   Collection Time: 01/01/21  9:23 AM   Specimen: Nasopharyngeal Swab; Nasopharyngeal(NP) swabs in vial transport medium  Result Value Ref Range Status   SARS Coronavirus 2 by RT PCR NEGATIVE NEGATIVE Final    Comment: (NOTE) SARS-CoV-2 target nucleic acids are NOT DETECTED.  The SARS-CoV-2 RNA is generally detectable in upper respiratory specimens during the acute phase of infection. The lowest concentration of SARS-CoV-2 viral copies this assay can detect is 138 copies/mL. A negative result does not preclude SARS-Cov-2 infection and should not be used as the sole basis for treatment or other patient management decisions. A negative result may occur with  improper specimen  collection/handling, submission of specimen other than nasopharyngeal swab, presence of viral mutation(s) within the areas targeted by this assay, and inadequate number of viral copies(<138 copies/mL). A negative result must be combined with clinical observations, patient history, and epidemiological information. The expected result is Negative.  Fact Sheet for Patients:  14/01/22  Fact Sheet for Healthcare Providers:  BloggerCourse.com  This test is no t yet approved or cleared by the [employer] FDA and  has been authorized for detection and/or diagnosis of SARS-CoV-2 by FDA under an Emergency Use Authorization (EUA). This EUA will remain  in effect (meaning this test can be used) for the duration of the COVID-19 declaration  under Section 564(b)(1) of the Act, 21 U.S.C.section 360bbb-3(b)(1), unless the authorization is terminated  or revoked sooner.       Influenza A by PCR NEGATIVE NEGATIVE Final   Influenza B by PCR NEGATIVE NEGATIVE Final    Comment: (NOTE) The Xpert Xpress SARS-CoV-2/FLU/RSV plus assay is intended as an aid in the diagnosis of influenza from Nasopharyngeal swab specimens and should not be used as a sole basis for treatment. Nasal washings and aspirates are unacceptable for Xpert Xpress SARS-CoV-2/FLU/RSV testing.  Fact Sheet for Patients: BloggerCourse.com  Fact Sheet for Healthcare Providers: [employer]ious[employer]ker[employer]it  This test is not yet approved or cleared by the [place] FDA and has been authorized for detection and/or diagnosis of SARS-CoV-2 by FDA under an Emergency Use Authorization (EUA). This EUA will remain in effect (meaning this test can be used) for the duration of the COVID-19 declaration under Section 564(b)(1) of the Act, 21 U.S.C. section 360bbb-3(b)(1), unless the authorization is terminated or revoked.  Performed at [facility], [address] [place], [place] [address]       Radiology Studies: No results found.   Pamella Pert, MD, PhD [facility]  Between 7 am - 7 pm I am available, please contact me via Amion (for emergencies) or Securechat (non urgent messages)  Between 7 pm - 7 am I am not available, please contact night coverage MD/APP via Amion

## 2021-01-02 NOTE — Care Management Obs Status (Signed)
MEDICARE OBSERVATION STATUS NOTIFICATION   Patient Details  Name: Kevin Santiago MRN: [id number] Date of Birth: 13-Nov-1940   Medicare Observation Status Notification Given:  Yes    Kermit Balo, RN 01/02/2021, 11:23 AM

## 2021-01-02 NOTE — Plan of Care (Signed)
  Problem: Education: Goal: Knowledge of disease or condition will improve Outcome: Progressing Goal: Knowledge of secondary prevention will improve (SELECT ALL) Outcome: Progressing Goal: Knowledge of patient specific risk factors will improve (INDIVIDUALIZE FOR PATIENT) Outcome: Progressing Goal: Individualized Educational Video(s) Outcome: Progressing   Problem: Coping: Goal: Will verbalize positive feelings about self Outcome: Progressing Goal: Will identify appropriate support needs Outcome: Progressing   Problem: Health Behavior/Discharge Planning: Goal: Ability to manage health-related needs will improve Outcome: Progressing   Problem: Self-Care: Goal: Verbalization of feelings and concerns over difficulty with self-care will improve Outcome: Progressing Goal: Ability to communicate needs accurately will improve Outcome: Progressing   Problem: Nutrition: Goal: Risk of aspiration will decrease Outcome: Progressing   Problem: Ischemic Stroke/TIA Tissue Perfusion: Goal: Complications of ischemic stroke/TIA will be minimized Outcome: Progressing

## 2021-01-02 NOTE — Consult Note (Signed)
Chief Complaint: Stenosis. Request is for cerebral basilar and  vertebral arteriogram with intervention   Referring Physician(s): Dr. Lavera Guise  Supervising Physician: Katherina Right Lyla Glassing  Patient Status: [facility] - In-pt  History of Present Illness: Kevin Santiago is a 80 y.o. male History of HTN, HLD. Presented to the ED at [facility] as a code stroke with left sided numbness. 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 Tiny acute infarct in the right posterior pons. No significant chronic ischemia Case has been reviewed and procedure approved by Dr. Dr. Raliegh Ip de Sindy Messing..  Patient tentatively scheduled for cerebral, basilar and vertebral arteriogram  with possible stenting.  Currently without any significant complaints. Patient alert and laying in bed, calm and comfortable. States that he feels better since his "blood thinned" He denies and left sided numbness or tingling. Or visual disturbance. Reports that his speech has improved. Denies any fevers, headache, chest pain, SOB, cough, abdominal pain, nausea, vomiting or bleeding. Return precautions and treatment recommendations and follow-up discussed with the patient who is agreeable with the plan.    Past Medical History:  Diagnosis Date   Anxiety    Depression    Hypercholesteremia    Hypertension     Past Surgical History:  Procedure Laterality Date   cervical fusion     SHOULDER SURGERY Left 1990   WRIST FRACTURE SURGERY      Allergies: Fish-derived products and Penicillins  Medications: Prior to Admission medications   Medication Sig Start Date End Date Taking? Authorizing Provider  cholecalciferol (VITAMIN D) 1000 UNITS tablet Take 2,000 Units by mouth daily.   Yes [provider]  fluorouracil (EFUDEX) 5 % cream Apply 1 application topically daily as needed. Skin basil cell areas as directed 07/18/20  Yes [provider]  hydrocortisone (ANUSOL-HC) 2.5 % rectal cream Place 1 application  rectally 2 (two) times daily. Patient taking differently: Place 1 application rectally 2 (two) times daily as needed for itching. 06/30/13  Yes Young, Vanessa [place], PA-C  hydroxypropyl methylcellulose (ISOPTO TEARS) 2.5 % ophthalmic solution Place 2 drops into both eyes 2 (two) times daily as needed for dry eyes.   Yes [provider]  losartan (COZAAR) 50 MG tablet Take 25 mg by mouth daily. 02/29/20  Yes [provider]  omeprazole (PRILOSEC) 40 MG capsule Take 40 mg by mouth daily as needed (ACID REFLUX).   Yes [provider]  ondansetron (ZOFRAN ODT) 4 MG disintegrating tablet Take 1 tablet (4 mg total) by mouth every 6 (six) hours as needed for nausea or vomiting. 06/26/13  Yes Sherwood Gambler, MD  pravastatin (PRAVACHOL) 40 MG tablet Take 40 mg by mouth at bedtime.   Yes [provider]  sertraline (ZOLOFT) 25 MG tablet Take 25 mg by mouth at bedtime.   Yes [provider]  tamsulosin (FLOMAX) 0.4 MG CAPS capsule Take 0.4 mg by mouth at bedtime.   Yes [provider]  ferrous sulfate 325 (65 FE) MG tablet Take 325 mg by mouth every other day. 02/29/20   [provider]     Family History  Problem Relation Age of Onset   Kidney disease Mother    Heart attack Father    Cancer Sister    Cancer Brother    Cancer Sister     Social History   Socioeconomic History   Marital status: Single    Spouse name: Not on file   Number of children: Not on file  Years of education: Not on file   Highest education level: Not on file  Occupational History   Not on file  Tobacco Use   Smoking status: Former   Smokeless tobacco: Never   Tobacco comments:    Quit smoking in 1970's; up until that time, 1 pack would last him 1 month.   Substance and Sexual Activity   Alcohol use: Not Currently   Drug use: No   Sexual activity: Not on file  Other Topics Concern   Not on file  Social History Narrative   Not on file   Social  Determinants of Health   Financial Resource Strain: Not on file  Food Insecurity: Not on file  Transportation Needs: Not on file  Physical Activity: Not on file  Stress: Not on file  Social Connections: Not on file    Review of Systems: A 12 point ROS discussed and pertinent positives are indicated in the HPI above.  All other systems are negative.  Review of Systems  Constitutional:  Negative for fever.  HENT:  Negative for congestion.   Respiratory:  Negative for cough and shortness of breath.   Cardiovascular:  Negative for chest pain.  Gastrointestinal:  Negative for abdominal pain.  Neurological:  Negative for headaches.  Psychiatric/Behavioral:  Negative for behavioral problems and confusion.    Vital Signs: BP (!) 112/91 (BP Location: Left Arm)   Pulse (!) 56   Temp (!) 97.5 F (36.4 C) (Oral)   Resp 18   Ht 5\' 10"  (1.778 m)   Wt 174 lb 2.6 oz (79 kg)   SpO2 96%   BMI 24.99 kg/m   Physical Exam Vitals and nursing note reviewed.  Constitutional:      Appearance: He is well-developed.  HENT:     Head: Normocephalic.  Cardiovascular:     Rate and Rhythm: Normal rate and regular rhythm.     Heart sounds: Normal heart sounds.  Pulmonary:     Effort: Pulmonary effort is normal.     Breath sounds: Normal breath sounds.  Musculoskeletal:        General: Normal range of motion.     Cervical back: Normal range of motion.  Skin:    General: Skin is dry.  Neurological:     Mental Status: He is alert and oriented to person, place, and time.    Imaging: CT ANGIO HEAD NECK W WO CM  Result Date: 12/31/2020 CLINICAL DATA:  Stroke/TIA.  Left-sided numbness and weakness. EXAM: CT ANGIOGRAPHY HEAD AND NECK TECHNIQUE: Multidetector CT imaging of the head and neck was performed using the standard protocol during bolus administration of intravenous contrast. Multiplanar CT image reconstructions and MIPs were obtained to evaluate the vascular anatomy. Carotid stenosis  measurements (when applicable) are obtained utilizing NASCET criteria, using the distal internal carotid diameter as the denominator. CONTRAST:  60mL OMNIPAQUE IOHEXOL 350 MG/ML SOLN COMPARISON:  CT head 12/31/2020 FINDINGS: CTA NECK FINDINGS Aortic arch: Minimal atherosclerotic disease aortic arch. Proximal great vessels widely patent. Right carotid system: Mild atherosclerotic disease right carotid bifurcation without significant stenosis. Left carotid system: Mild atherosclerotic disease left carotid bifurcation without stenosis Vertebral arteries: Right vertebral artery dominant and widely patent Small left vertebral artery is occluded in the mid neck with faint reconstitution distally with very small vessel. Skeleton: ACDF C4 through C6. No definite solid fusion at C4-5. Posterior cerclage wires at C3-4. Posterior screw and plate fusion C2 through C4. No acute skeletal abnormality. Other neck: Left lower pole thyroid  nodule 2.6 cm. Right lower pole thyroid nodule 11.8 cm. Recommend thyroid ultrasound (ref: J Am Coll Radiol. 2015 Feb;12(2): 143-50). Upper chest: Lung apices clear bilaterally. Review of the MIP images confirms the above findings CTA HEAD FINDINGS Anterior circulation: Mild atherosclerotic disease in the cavernous carotid bilaterally without stenosis. Anterior and middle cerebral arteries widely patent bilaterally. 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 Right PICA is patent. Occlusion of the mid left vertebral artery with reconstitution distally. Left vertebral artery is small and diseased vessel which supplies left PICA and small contribution to the basilar. Atherosclerotic irregularity and moderate stenosis in the basilar artery. Posterior cerebral arteries patent bilaterally. Fetal origin right posterior cerebral artery. Venous sinuses: Normal venous enhancement Anatomic variants: None Review of the MIP images confirms the above findings  IMPRESSION: 1. Negative for intracranial large vessel occlusion. 2. 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 3. Diffusely disease left vertebral artery which is small and occluded in the mid segment. Faint reconstitution distally. 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 Moderate stenosis basilar. 4. Bilateral thyroid nodules. Largest nodule on the left 2.6 cm. Recommend thyroid ultrasound. (Ref: J Am Coll Radiol. 2015 Feb;12(2): 143-50). Electronically Signed   By: Franchot Gallo M.D.   On: 12/31/2020 18:09   CT HEAD WO CONTRAST  Result Date: 12/31/2020 CLINICAL DATA:  Neuro deficit, acute, stroke suspected EXAM: CT HEAD WITHOUT CONTRAST TECHNIQUE: Contiguous axial images were obtained from the base of the skull through the vertex without intravenous contrast. COMPARISON:  2015 FINDINGS: Brain: There is no acute intracranial hemorrhage, mass effect, or edema. Gray-white differentiation is preserved. There is no extra-axial fluid collection. Ventricles and sulci are within normal limits in size and configuration. Vascular: There is atherosclerotic calcification at the skull base. Skull: Calvarium is unremarkable. Sinuses/Orbits: Paranasal sinus mucosal thickening. Orbits are unremarkable. Other: None. IMPRESSION: No acute intracranial hemorrhage or evidence of acute infarction. Electronically Signed   By: Macy Mis M.D.   On: 12/31/2020 08:15   MR BRAIN WO CONTRAST  Result Date: 12/31/2020 CLINICAL DATA:  Stroke/TIA.  Left-sided numbness and weakness. EXAM: MRI HEAD WITHOUT CONTRAST TECHNIQUE: Multiplanar, multiecho pulse sequences of the brain and surrounding structures were obtained without intravenous contrast. 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 Small area of acute infarct in the right posterior pons in the floor of the fourth ventricle.  Ventricle size and cerebral volume normal. Negative for hemorrhage or mass. Normal white matter. Vascular: Normal arterial flow voids Skull and upper cervical spine: Negative Sinuses/Orbits: Mucosal edema paranasal sinuses.  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 Tiny acute infarct in the right posterior pons. No significant chronic ischemia Electronically Signed   By: Franchot Gallo M.D.   On: 12/31/2020 19:14   ECHOCARDIOGRAM COMPLETE  Result Date: 01/01/2021    ECHOCARDIOGRAM REPORT   Patient Name:   ABAD EDGE [facility] [address] Date of Exam: 01/01/2021 Medical Rec #:  [id number]     Height:       70.0 in Accession #:    [id number]    Weight:       174.2 lb Date of Birth:  Jan 26, 1941    BSA:          1.968 m Patient Age:    70 years      BP:  125/81 mmHg Patient Gender: M             HR:           49 bpm. Exam Location:  Inpatient Procedure: 2D Echo, Cardiac Doppler and Color Doppler Indications:    Stroke  History:        Patient has no prior history of Echocardiogram examinations.                 Risk Factors:Hypertension.  Sonographer:    Jyl Heinz Referring Phys: [id number] [place]  1. Left ventricular ejection fraction, by estimation, is 60 to 65%. The left ventricle has normal function. The left ventricle has no regional wall motion abnormalities. There is mild left ventricular hypertrophy. Left ventricular diastolic parameters were normal.  2. Right ventricular systolic function is normal. The right ventricular size is normal. There is normal pulmonary artery systolic pressure.  3. The mitral valve is normal in structure. Trivial mitral valve regurgitation. No evidence of mitral stenosis.  4. The aortic valve is tricuspid. Aortic valve regurgitation is not visualized. Aortic valve sclerosis is present, with no evidence of aortic valve stenosis.  5. Aortic dilatation noted. There is mild dilatation of the ascending aorta, measuring 43 mm.  6. The inferior  vena cava is dilated in size with >50% respiratory variability, suggesting right atrial pressure of 8 mmHg. Comparison(s): No prior Echocardiogram. FINDINGS  Left Ventricle: Left ventricular ejection fraction, by estimation, is 60 to 65%. The left ventricle has normal function. The left ventricle has no regional wall motion abnormalities. The left ventricular internal cavity size was normal in size. There is  mild left ventricular hypertrophy. Left ventricular diastolic parameters were normal. Right Ventricle: The right ventricular size is normal. Right ventricular systolic function is normal. There is normal pulmonary artery systolic pressure. The tricuspid regurgitant velocity is 2.50 m/s, and with an assumed right atrial pressure of 8 mmHg,  the estimated right ventricular systolic pressure is 123456 mmHg. Left Atrium: Left atrial size was normal in size. Right Atrium: Right atrial size was normal in size. Pericardium: There is no evidence of pericardial effusion. Mitral Valve: The mitral valve is normal in structure. Trivial mitral valve regurgitation. No evidence of mitral valve stenosis. Tricuspid Valve: The tricuspid valve is normal in structure. Tricuspid valve regurgitation is mild . No evidence of tricuspid stenosis. Aortic Valve: The aortic valve is tricuspid. Aortic valve regurgitation is not visualized. Aortic valve sclerosis is present, with no evidence of aortic valve stenosis. Aortic valve peak gradient measures 6.0 mmHg. Pulmonic Valve: The pulmonic valve was normal in structure. Pulmonic valve regurgitation is trivial. No evidence of pulmonic stenosis. Aorta: Aortic dilatation noted. There is mild dilatation of the ascending aorta, measuring 43 mm. Venous: The inferior vena cava is dilated in size with greater than 50% respiratory variability, suggesting right atrial pressure of 8 mmHg. IAS/Shunts: No atrial level shunt detected by color flow Doppler.  LEFT VENTRICLE PLAX 2D LVIDd:         4.40 cm      Diastology LVIDs:         2.60 cm     LV e' medial:    7.83 cm/s LV PW:         1.20 cm     LV E/e' medial:  9.3 LV IVS:        1.20 cm     LV e' lateral:   8.81 cm/s LVOT diam:     2.00 cm  LV E/e' lateral: 8.3 LV SV:         78 LV SV Index:   40 LVOT Area:     3.14 cm  LV Volumes (MOD) LV vol d, MOD A2C: 89.2 ml LV vol d, MOD A4C: 90.3 ml LV vol s, MOD A2C: 35.6 ml LV vol s, MOD A4C: 38.2 ml LV SV MOD A2C:     53.6 ml LV SV MOD A4C:     90.3 ml LV SV MOD BP:      52.4 ml RIGHT VENTRICLE             IVC RV Basal diam:  3.30 cm     IVC diam: 2.20 cm RV Mid diam:    2.40 cm RV S prime:     10.80 cm/s TAPSE (M-mode): 2.1 cm LEFT ATRIUM             Index        RIGHT ATRIUM           Index LA diam:        4.20 cm 2.13 cm/m   RA Area:     16.90 cm LA Vol (A2C):   42.2 ml 21.44 ml/m  RA Volume:   45.10 ml  22.91 ml/m LA Vol (A4C):   40.6 ml 20.63 ml/m LA Biplane Vol: 42.0 ml 21.34 ml/m  AORTIC VALVE AV Area (Vmax): 2.81 cm AV Vmax:        122.00 cm/s AV Peak Grad:   6.0 mmHg LVOT Vmax:      109.00 cm/s LVOT Vmean:     76.300 cm/s LVOT VTI:       0.249 m  AORTA Ao Root diam: 3.20 cm Ao Asc diam:  3.90 cm MITRAL VALVE               TRICUSPID VALVE MV Area (PHT): 3.53 cm    TR Peak grad:   25.0 mmHg MV Decel Time: 215 msec    TR Vmax:        250.00 cm/s MV E velocity: 73.10 cm/s MV A velocity: 74.40 cm/s  SHUNTS MV E/A ratio:  0.98        Systemic VTI:  0.25 m                            Systemic Diam: 2.00 cm Kirk Ruths MD Electronically signed by Kirk Ruths MD Signature Date/Time: 01/01/2021/11:51:11 AM    Final     Labs:  CBC: Recent Labs    12/31/20 0715 12/31/20 0730 01/01/21 0250  WBC 4.1  --  5.6  HGB 15.4 15.0 15.0  HCT 45.3 44.0 44.8  PLT 83*  --  92*    COAGS: Recent Labs    12/31/20 0715  INR 1.0  APTT 27    BMP: Recent Labs    12/31/20 0715 12/31/20 0730 01/01/21 0250  NA 138 141 137  K 3.9 3.9 3.9  CL 107 105 106  CO2 23  --  23  GLUCOSE 121* 119* 98  BUN 17  18 13   CALCIUM 9.2  --  9.0  CREATININE 1.33* 1.30* 1.20  GFRNONAA 55*  --  >60    LIVER FUNCTION TESTS: Recent Labs    12/31/20 0715 01/01/21 0250  BILITOT 1.0 1.6*  AST 22 19  ALT 20 20  ALKPHOS 50 45  PROT 6.6 6.2*  ALBUMIN 4.3 4.0     Assessment  and Plan:  80 y.o. male inpatient. History of HTN, HLD. Presented to the ED at [facility] as a code stroke with left sided numbness. 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 Tiny acute infarct in the right posterior pons. No significant chronic ischemia Case has been reviewed and procedure approved by Dr. Dr. Kirtland Bouchard de Melchor Amour..  Patient tentatively scheduled for cerebralm basilar and vertebral arteriogram  with possible stenting. .  Team instructed to: Keep Patient to be NPO after midnight IR will call patient when ready.  Patient was preloaded on Briliinta 180 mg and ASA 81 mg on 12.1.22.  HDL 32, LDL 128, All other labs and medications are within acceptable parameters. Allergies include PCN, Patient has been NPO since midnight.   Risks and benefits of cerebral basilar and  vertebral arteriogram with intervention were discussed with the patient including, but not limited to bleeding, infection, vascular injury, contrast induced renal failure, stroke, reperfusion hemorrhage, or even death. This interventional procedure involves the use of X-rays and because of the nature of the planned procedure, it is possible that we will have prolonged use of X-ray fluoroscopy. Potential radiation risks to you include (but are not limited to) the following: - A slightly elevated risk for cancer  several years later in life. This risk is typically less than 0.5% percent. This risk is low in comparison to the normal incidence of human cancer, which is 33% for women and 50% for men according to the [employer] Society. - Radiation induced injury can include skin redness, resembling a rash, tissue breakdown / ulcers and hair loss (which can  be temporary or permanent).  The likelihood of either of these occurring depends on the difficulty of the procedure and whether you are sensitive to radiation due to previous procedures, disease, or genetic conditions.  IF your procedure requires a prolonged use of radiation, you will be notified and given written instructions for further action.  It is your responsibility to monitor the irradiated area for the 2 weeks following the procedure and to notify your physician if you are concerned that you have suffered a radiation induced injury.   All of the patient's questions were answered, patient is agreeable to proceed. Consent signed and in chart.   Thank you for this interesting consult.  I greatly enjoyed meeting Kevin Santiago and look forward to participating in their care.  A copy of this report was sent to the requesting provider on this date.  Electronically Signed: Alene Mires, NP 01/02/2021, 10:16 AM   I spent a total of 40 Minutes    in face to face in clinical consultation, greater than 50% of which was counseling/coordinating care for  cerebral basilar and  vertebral arteriogram with intervention

## 2021-01-02 NOTE — Procedures (Signed)
INTERVENTIONAL NEURORADIOLOGY BRIEF POSTPROCEDURE NOTE  DIAGNOSTIC CEREBRAL ANGIOGRAM AND INTRACRANIAL ANGIOPLASTY AND STENTING  Attending: Dr. Baldemar Lenis  Assistant: None.  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  Complications: None.  Estimated blood loss: Minimal.  Specimen: None.  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 The left vertebral artery is occluded at the origin with minimal recanalization in the neck via collaterals. No significant stenosis in the bilateral carotid bifurcations.   Intracranial angioplasty/stenting performed at the basilar and intracranial right vertebral artery with complete resolution of stenosis.   The patient tolerated the procedure well without incident or complication and is in stable condition.   PLAN: - Distal radial band care per orders instructions - SBP 120-140 mmHg - cleviprex ordered - Continue DAPT with ASA 81 mg qd and brilinta 90 mg bid.

## 2021-01-02 NOTE — Progress Notes (Signed)
Pacu RN Report to floor given  Gave report to [employer], rm [phone]. Discussed surgery, meds given in OR and Pacu, VS, IV fluids given, EBL, urine output, pain and other pertinent information. Also discussed if pt had any family or friends here or belongings with them.   Discussed pt waking up from nap and having return of double vision. Dr Tommi Rumps Melchor Amour was called and she wants to just continue to watch patient.   Discussed Prelude Sync Closer device bracelet and that I have started removing air and pt is stable, no bleeding or hematoma noted.   Pt exits my care.

## 2021-01-02 NOTE — Progress Notes (Signed)
Occupational Therapy Treatment Patient Details Name: Kevin Santiago MRN: [id number] DOB: 1940-06-24 Today's Date: 01/02/2021   History of present illness Pt is a 80 y/o male admitted secondary to L sided numbness and speech deficits. 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 PMH includes HTN.   OT comments  Patient received in bed and eager to participate with OT. Patient was able to get to EOB before cued.  Patient ambulated to sink and performed self care standing at sink with min guard and without an assistive device. Dynamic balance performed to challenge balance and address safety with min guard to min assist for balance. Patient making good progress with OT treatment.  Acute OT to continue to follow.    Recommendations for follow up therapy are one component of a multi-disciplinary discharge planning process, led by the attending physician.  Recommendations may be updated based on patient status, additional functional criteria and insurance authorization.    Follow Up Recommendations  No OT follow up    Assistance Recommended at Discharge Intermittent Supervision/Assistance  Equipment Recommendations  None recommended by OT    Recommendations for Other Services      Precautions / Restrictions Precautions Precautions: Fall Restrictions Weight Bearing Restrictions: No       Mobility Bed Mobility Overal bed mobility: Needs Assistance Bed Mobility: Supine to Sit;Sit to Supine     Supine to sit: Supervision Sit to supine: Supervision   General bed mobility comments: performed before cued to get to eob    Transfers Overall transfer level: Needs assistance Equipment used: None Transfers: Sit to/from Stand;Bed to chair/wheelchair/BSC Sit to Stand: Min guard   Step pivot transfers: Min guard       General transfer comment: min guard without an assistive device     Balance Overall balance assessment: Needs assistance Sitting-balance support: No upper  extremity supported;Feet supported Sitting balance-Leahy Scale: Good     Standing balance support: No upper extremity supported;Bilateral upper extremity supported Standing balance-Leahy Scale: Fair Standing balance comment: Fair static standing, but poor dynamic balance                           ADL either performed or assessed with clinical judgement   ADL Overall ADL's : Needs assistance/impaired     Grooming: Wash/dry hands;Wash/dry face;Oral care;Brushing hair;Min guard;Standing Grooming Details (indicate cue type and reason): stood at sink to perform grooming Upper Body Bathing: Min guard;Standing Upper Body Bathing Details (indicate cue type and reason): performed standing at sink Lower Body Bathing: Min guard;Sit to/from stand Lower Body Bathing Details (indicate cue type and reason): perfomred standing at sink Upper Body Dressing : Minimal assistance;Standing Upper Body Dressing Details (indicate cue type and reason): min assist due to lines                 Functional mobility during ADLs: Min guard General ADL Comments: discussed safety with need to sit if fatigue or dizzy    Extremity/Trunk Assessment              Vision       Perception     Praxis      Cognition Arousal/Alertness: Awake/alert Behavior During Therapy: WFL for tasks assessed/performed Overall Cognitive Status: Within Functional Limits for tasks assessed  General Comments: verbal cues for safety          Exercises     Shoulder Instructions       General Comments      Pertinent Vitals/ Pain       Pain Assessment: No/denies pain  Home Living                                          Prior Functioning/Environment              Frequency  Min 2X/week        Progress Toward Goals  OT Goals(current goals can now be found in the care plan section)  Progress towards OT goals: Progressing  toward goals  Acute Rehab OT Goals Patient Stated Goal: go home OT Goal Formulation: With patient Time For Goal Achievement: 01/15/21 Potential to Achieve Goals: Good ADL Goals Pt Will Perform Grooming: with modified independence;standing Pt Will Perform Lower Body Bathing: with modified independence;sit to/from stand Pt Will Perform Lower Body Dressing: with modified independence;sit to/from stand Pt Will Transfer to Toilet: with modified independence;ambulating;regular height toilet;grab bars Pt Will Perform Toileting - Clothing Manipulation and hygiene: with modified independence;sit to/from stand Pt Will Perform Tub/Shower Transfer: Shower transfer;with modified independence;ambulating;shower seat;rolling walker  Plan Discharge plan remains appropriate    Co-evaluation                 AM-PAC OT "6 Clicks" Daily Activity     Outcome Measure   Help from another person eating meals?: None Help from another person taking care of personal grooming?: A Little Help from another person toileting, which includes using toliet, bedpan, or urinal?: A Little Help from another person bathing (including washing, rinsing, drying)?: A Little Help from another person to put on and taking off regular upper body clothing?: A Little Help from another person to put on and taking off regular lower body clothing?: A Little 6 Click Score: 19    End of Session Equipment Utilized During Treatment: Gait belt  OT Visit Diagnosis: Unsteadiness on feet (R26.81)   Activity Tolerance Patient tolerated treatment well   Patient Left in bed;with call bell/phone within reach;with bed alarm set   Nurse Communication Mobility status        Time: 9381-8299 OT Time Calculation (min): 27 min  Charges: OT General Charges $OT Visit: 1 Visit OT Treatments $Self Care/Home Management : 8-22 mins $Therapeutic Activity: 8-22 mins  Alfonse Flavors, OTA Acute Rehabilitation Services  Pager  [phone] Office [phone]   Dewain Penning 01/02/2021, 8:33 AM

## 2021-01-02 NOTE — Anesthesia Procedure Notes (Signed)
Arterial Line Insertion Start/End12/03/2020 1:15 PM, 01/02/2021 1:20 PM Performed by: Nils Pyle, CRNA, CRNA  Patient location: OR. Preanesthetic checklist: patient identified, IV checked, site marked, risks and benefits discussed, surgical consent, monitors and equipment checked, pre-op evaluation and anesthesia consent Lidocaine 1% used for infiltration Left, radial was placed Catheter size: 20 G Hand hygiene performed  and maximum sterile barriers used   Attempts: 1 Procedure performed without using ultrasound guided technique. Ultrasound Notes:anatomy identified, needle tip was noted to be adjacent to the nerve/plexus identified and no ultrasound evidence of intravascular and/or intraneural injection Following insertion, dressing applied and Biopatch. Post procedure assessment: normal and unchanged  Patient tolerated the procedure well with no immediate complications.

## 2021-01-02 NOTE — Progress Notes (Signed)
STROKE TEAM PROGRESS NOTE   SUBJECTIVE (INTERVAL HISTORY) No family is at the bedside.  Sitting in bed NAD. Continued to remains NPO for Basilar artery stent planned for today 01/02/21  OBJECTIVE Temp:  [97.5 F (36.4 C)-98.1 F (36.7 C)] 97.7 F (36.5 C) (12/02 1234) Pulse Rate:  [54-61] 60 (12/02 1234) Cardiac Rhythm: Sinus bradycardia (12/02 0853) Resp:  [17-18] 17 (12/02 1234) BP: (112-168)/(68-91) 168/83 (12/02 1234) SpO2:  [96 %-99 %] 98 % (12/02 1234)  Recent Labs  Lab 12/31/20 0723  GLUCAP 127*   Recent Labs  Lab 12/31/20 0715 12/31/20 0730 01/01/21 0250  NA 138 141 137  K 3.9 3.9 3.9  CL 107 105 106  CO2 23  --  23  GLUCOSE 121* 119* 98  BUN CREATININE 1.33* 1.30* 1.20  CALCIUM 9.2  --  9.0  MG  --   --  2.1   Recent Labs  Lab 12/31/20 0715 01/01/21 0250  AST 22 19  ALT 20 20  ALKPHOS 50 45  BILITOT 1.0 1.6*  PROT 6.6 6.2*  ALBUMIN 4.3 4.0   Recent Labs  Lab 12/31/20 0715 12/31/20 0730 01/01/21 0250  WBC 4.1  --  5.6  NEUTROABS 3.0  --   --   HGB 15.4 15.0 15.0  HCT 45.3 44.0 44.8  MCV 83.1  --  84.1  PLT 83*  --  92*   No results for input(s): CKTOTAL, CKMB, CKMBINDEX, TROPONINI in the last 168 hours. Recent Labs    12/31/20 0715  LABPROT 13.4  INR 1.0   No results for input(s): COLORURINE, LABSPEC, PHURINE, GLUCOSEU, HGBUR, BILIRUBINUR, KETONESUR, PROTEINUR, UROBILINOGEN, NITRITE, LEUKOCYTESUR in the last 72 hours.  Invalid input(s): APPERANCEUR     Component Value Date/Time   CHOL 184 01/01/2021 0250   TRIG 121 01/01/2021 0250   HDL 32 (L) 01/01/2021 0250   CHOLHDL 5.8 01/01/2021 0250   VLDL 24 01/01/2021 0250   LDLCALC 128 (H) 01/01/2021 0250   Lab Results  Component Value Date   HGBA1C 5.1 01/01/2021   No results found for: LABOPIA, COCAINSCRNUR, LABBENZ, AMPHETMU, THCU, LABBARB  No results for input(s): ETH in the last 168 hours.  I have personally reviewed the radiological images below and agree with the  radiology interpretations.  CT ANGIO HEAD NECK W WO CM  Result Date: 12/31/2020 CLINICAL DATA:  Stroke/TIA.  Left-sided numbness and weakness. EXAM: CT ANGIOGRAPHY HEAD AND NECK TECHNIQUE: Multidetector CT imaging of the head and neck was performed using the standard protocol during bolus administration of intravenous contrast. Multiplanar CT image reconstructions and MIPs were obtained to evaluate the vascular anatomy. Carotid stenosis measurements (when applicable) are obtained utilizing NASCET criteria, using the distal internal carotid diameter as the denominator. CONTRAST:  75mL OMNIPAQUE IOHEXOL 350 MG/ML SOLN COMPARISON:  CT head 12/31/2020 FINDINGS: CTA NECK FINDINGS Aortic arch: Minimal atherosclerotic disease aortic arch. Proximal great vessels widely patent. Right carotid system: Mild atherosclerotic disease right carotid bifurcation without significant stenosis. Left carotid system: Mild atherosclerotic disease left carotid bifurcation without stenosis Vertebral arteries: Right vertebral artery dominant and widely patent Small left vertebral artery is occluded in the mid neck with faint reconstitution distally with very small vessel. Skeleton: ACDF C4 through C6. No definite solid fusion at C4-5. Posterior cerclage wires at C3-4. Posterior screw and plate fusion C2 through C4. No acute skeletal abnormality. Other neck: Left lower pole thyroid nodule 2.6 cm. Right lower pole thyroid nodule 11.8 cm. Recommend thyroid ultrasound (  ref: J Am Coll Radiol. 2015 Feb;12(2): 143-50). Upper chest: Lung apices clear bilaterally. Review of the MIP images confirms the above findings CTA HEAD FINDINGS Anterior circulation: Mild atherosclerotic disease in the cavernous carotid bilaterally without stenosis. Anterior and middle cerebral arteries widely patent bilaterally. 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 Right PICA is patent. Occlusion of the mid left  vertebral artery with reconstitution distally. Left vertebral artery is small and diseased vessel which supplies left PICA and small contribution to the basilar. Atherosclerotic irregularity and moderate stenosis in the basilar artery. Posterior cerebral arteries patent bilaterally. Fetal origin right posterior cerebral artery. Venous sinuses: Normal venous enhancement Anatomic variants: None Review of the MIP images confirms the above findings IMPRESSION: 1. Negative for intracranial large vessel occlusion. 2. 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 3. Diffusely disease left vertebral artery which is small and occluded in the mid segment. Faint reconstitution distally. 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 Moderate stenosis basilar. 4. Bilateral thyroid nodules. Largest nodule on the left 2.6 cm. Recommend thyroid ultrasound. (Ref: J Am Coll Radiol. 2015 Feb;12(2): 143-50). Electronically Signed   By: Marlan Palau M.D.   On: 12/31/2020 18:09   CT HEAD WO CONTRAST  Result Date: 12/31/2020 CLINICAL DATA:  Neuro deficit, acute, stroke suspected EXAM: CT HEAD WITHOUT CONTRAST TECHNIQUE: Contiguous axial images were obtained from the base of the skull through the vertex without intravenous contrast. COMPARISON:  2015 FINDINGS: Brain: There is no acute intracranial hemorrhage, mass effect, or edema. Gray-white differentiation is preserved. There is no extra-axial fluid collection. Ventricles and sulci are within normal limits in size and configuration. Vascular: There is atherosclerotic calcification at the skull base. Skull: Calvarium is unremarkable. Sinuses/Orbits: Paranasal sinus mucosal thickening. Orbits are unremarkable. Other: None. IMPRESSION: No acute intracranial hemorrhage or evidence of acute infarction. Electronically Signed   By: Guadlupe Spanish M.D.   On: 12/31/2020 08:15   MR  BRAIN WO CONTRAST  Result Date: 12/31/2020 CLINICAL DATA:  Stroke/TIA.  Left-sided numbness and weakness. EXAM: MRI HEAD WITHOUT CONTRAST TECHNIQUE: Multiplanar, multiecho pulse sequences of the brain and surrounding structures were obtained without intravenous contrast. 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 Small area of acute infarct in the right posterior pons in the floor of the fourth ventricle. Ventricle size and cerebral volume normal. Negative for hemorrhage or mass. Normal white matter. Vascular: Normal arterial flow voids Skull and upper cervical spine: Negative Sinuses/Orbits: Mucosal edema paranasal sinuses.  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 Tiny acute infarct in the right posterior pons. No significant chronic ischemia Electronically Signed   By: Marlan Palau M.D.   On: 12/31/2020 19:14   ECHOCARDIOGRAM COMPLETE  Result Date: 01/01/2021    ECHOCARDIOGRAM REPORT   Patient Name:   Kevin Santiago [facility] Date of Exam: 01/01/2021 Medical Rec #:  [id number]     Height:       70.0 in Accession #:    [id number]    Weight:       174.2 lb Date of Birth:  1940-08-08    BSA:          1.968 m Patient Age:    80 years      BP:           125/81 mmHg Patient Gender: M  HR:           49 bpm. Exam Location:  Inpatient Procedure: 2D Echo, Cardiac Doppler and Color Doppler Indications:    Stroke  History:        Patient has no prior history of Echocardiogram examinations.                 Risk Factors:Hypertension.  Sonographer:    Cleatis Polka Referring Phys: [id number] JUSTIN B HOWERTER IMPRESSIONS  1. Left ventricular ejection fraction, by estimation, is 60 to 65%. The left ventricle has normal function. The left ventricle has no regional wall motion abnormalities. There is mild left ventricular hypertrophy. Left ventricular diastolic parameters were normal.  2. Right ventricular systolic function is normal. The right ventricular  size is normal. There is normal pulmonary artery systolic pressure.  3. The mitral valve is normal in structure. Trivial mitral valve regurgitation. No evidence of mitral stenosis.  4. The aortic valve is tricuspid. Aortic valve regurgitation is not visualized. Aortic valve sclerosis is present, with no evidence of aortic valve stenosis.  5. Aortic dilatation noted. There is mild dilatation of the ascending aorta, measuring 43 mm.  6. The inferior vena cava is dilated in size with >50% respiratory variability, suggesting right atrial pressure of 8 mmHg. Comparison(s): No prior Echocardiogram. FINDINGS  Left Ventricle: Left ventricular ejection fraction, by estimation, is 60 to 65%. The left ventricle has normal function. The left ventricle has no regional wall motion abnormalities. The left ventricular internal cavity size was normal in size. There is  mild left ventricular hypertrophy. Left ventricular diastolic parameters were normal. Right Ventricle: The right ventricular size is normal. Right ventricular systolic function is normal. There is normal pulmonary artery systolic pressure. The tricuspid regurgitant velocity is 2.50 m/s, and with an assumed right atrial pressure of 8 mmHg,  the estimated right ventricular systolic pressure is 33.0 mmHg. Left Atrium: Left atrial size was normal in size. Right Atrium: Right atrial size was normal in size. Pericardium: There is no evidence of pericardial effusion. Mitral Valve: The mitral valve is normal in structure. Trivial mitral valve regurgitation. No evidence of mitral valve stenosis. Tricuspid Valve: The tricuspid valve is normal in structure. Tricuspid valve regurgitation is mild . No evidence of tricuspid stenosis. Aortic Valve: The aortic valve is tricuspid. Aortic valve regurgitation is not visualized. Aortic valve sclerosis is present, with no evidence of aortic valve stenosis. Aortic valve peak gradient measures 6.0 mmHg. Pulmonic Valve: The pulmonic valve  was normal in structure. Pulmonic valve regurgitation is trivial. No evidence of pulmonic stenosis. Aorta: Aortic dilatation noted. There is mild dilatation of the ascending aorta, measuring 43 mm. Venous: The inferior vena cava is dilated in size with greater than 50% respiratory variability, suggesting right atrial pressure of 8 mmHg. IAS/Shunts: No atrial level shunt detected by color flow Doppler.  LEFT VENTRICLE PLAX 2D LVIDd:         4.40 cm     Diastology LVIDs:         2.60 cm     LV e' medial:    7.83 cm/s LV PW:         1.20 cm     LV E/e' medial:  9.3 LV IVS:        1.20 cm     LV e' lateral:   8.81 cm/s LVOT diam:     2.00 cm     LV E/e' lateral: 8.3 LV SV:  78 LV SV Index:   40 LVOT Area:     3.14 cm  LV Volumes (MOD) LV vol d, MOD A2C: 89.2 ml LV vol d, MOD A4C: 90.3 ml LV vol s, MOD A2C: 35.6 ml LV vol s, MOD A4C: 38.2 ml LV SV MOD A2C:     53.6 ml LV SV MOD A4C:     90.3 ml LV SV MOD BP:      52.4 ml RIGHT VENTRICLE             IVC RV Basal diam:  3.30 cm     IVC diam: 2.20 cm RV Mid diam:    2.40 cm RV S prime:     10.80 cm/s TAPSE (M-mode): 2.1 cm LEFT ATRIUM             Index        RIGHT ATRIUM           Index LA diam:        4.20 cm 2.13 cm/m   RA Area:     16.90 cm LA Vol (A2C):   42.2 ml 21.44 ml/m  RA Volume:   45.10 ml  22.91 ml/m LA Vol (A4C):   40.6 ml 20.63 ml/m LA Biplane Vol: 42.0 ml 21.34 ml/m  AORTIC VALVE AV Area (Vmax): 2.81 cm AV Vmax:        122.00 cm/s AV Peak Grad:   6.0 mmHg LVOT Vmax:      109.00 cm/s LVOT Vmean:     76.300 cm/s LVOT VTI:       0.249 m  AORTA Ao Root diam: 3.20 cm Ao Asc diam:  3.90 cm MITRAL VALVE               TRICUSPID VALVE MV Area (PHT): 3.53 cm    TR Peak grad:   25.0 mmHg MV Decel Time: 215 msec    TR Vmax:        250.00 cm/s MV E velocity: 73.10 cm/s MV A velocity: 74.40 cm/s  SHUNTS MV E/A ratio:  0.98        Systemic VTI:  0.25 m                            Systemic Diam: 2.00 cm Olga Millers MD Electronically signed by Olga Millers MD Signature Date/Time: 01/01/2021/11:51:11 AM    Final       PHYSICAL EXAM  Temp:  [97.5 F (36.4 C)-98.1 F (36.7 C)] 97.7 F (36.5 C) (12/02 1234) Pulse Rate:  [54-61] 60 (12/02 1234) Resp:  [17-18] 17 (12/02 1234) BP: (112-168)/(68-91) 168/83 (12/02 1234) SpO2:  [96 %-99 %] 98 % (12/02 1234)  General - Well nourished, well developed, in no apparent distress.  Ophthalmologic - fundi not visualized due to noncooperation.  Cardiovascular - Regular rhythm and rate.  Mental Status -  Level of arousal and orientation to time, place, and person were intact. Language including expression, naming, repetition, comprehension was assessed and found intact. Fund of Knowledge was assessed and was intact.  Cranial Nerves II - XII - II - Visual field intact OU. III, IV, VI - Extraocular movements intact. V - Facial sensation intact bilaterally. VII - Facial movement intact bilaterally. VIII - Hearing & vestibular intact bilaterally. X - Palate elevates symmetrically. XI - Chin turning & shoulder shrug intact bilaterally. XII - Tongue protrusion intact.  Motor Strength - The patient's strength was normal in all  extremities and pronator drift was absent.  Bulk was normal and fasciculations were absent.   Motor Tone - Muscle tone was assessed at the neck and appendages and was normal.  Reflexes - The patient's reflexes were symmetrical in all extremities and he had no pathological reflexes.  Sensory - Light touch, temperature/pinprick were assessed and were symmetrical.    Coordination - The patient had normal movements in the hands and left foot with no ataxia or dysmetria.  Slight dysmetria and right HTS. Tremor was absent.  Gait and Station - deferred.   ASSESSMENT/PLAN [name] is a 80 y.o. male with history of hypertension, hyperlipidemia, depression admitted for mild double vision, imbalance, left-sided numbness and dysarthria. No tPA given due to outside  window.    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 CT no acute finding CT head and neck mid basilar arteries severe stenosis, right VA tandem stenosis MRI left mid pontine infarct, right ventral pontine punctate infarct. 2D Echo EF 60 to 65% LDL 128 HgbA1c 5.1 SCDs for VTE prophylaxis No antithrombotic prior to admission, now on aspirin 81 mg daily and Brilinta (ticagrelor) 90 mg bid after loading. Patient counseled to be compliant with his antithrombotic medications Ongoing aggressive stroke risk factor management Therapy recommendations: Outpatient PT Disposition: Pending  Posterior circulation severe stenosis CT head and neck mid basilar arteries severe stenosis, right VA tandem stenosis. Plan for Basilar artery stent 01/02/21 Likely the cause of current stroke Discussed with Dr. Sherlon Handing interventional radiology, will consider right VA and BA standing tomorrow. N.p.o. after midnight. On aspirin and Brilinta after loading.  Hypertension Stable Avoid low BP BP goal 130-160 before intervention Long term BP goal normotensive after intervention  Hyperlipidemia Home meds: Pravastatin 40 LDL 128, goal < 70 Now on Lipitor 80 Continue statin at discharge  Other Stroke Risk Factors Advanced age  Other Active Problems Depression on Zoloft  Hospital day # 0  Valentina Lucks, MSN, NP-C [facility] See AMION or use Epic Chat     To contact Stroke Continuity provider, please refer to [email or web address]. After hours, contact General Neurology

## 2021-01-03 ENCOUNTER — Inpatient Hospital Stay (HOSPITAL_COMMUNITY): Payer: No Typology Code available for payment source

## 2021-01-03 MED ORDER — POLYVINYL ALCOHOL 1.4 % OP SOLN
1.0000 [drp] | OPHTHALMIC | Status: DC | PRN
  Administered 2021-01-04: 10:00:00 1 [drp] via OPHTHALMIC
  Filled 2021-01-03: qty 15

## 2021-01-03 MED ORDER — HYDRALAZINE HCL 20 MG/ML IJ SOLN
10.0000 mg | INTRAMUSCULAR | Status: DC | PRN
  Administered 2021-01-03 (×2): 10 mg via INTRAVENOUS
  Filled 2021-01-03: qty 1

## 2021-01-03 NOTE — Progress Notes (Signed)
Physical Therapy Treatment Patient Details Name: Kevin Santiago MRN: [id number] DOB: [birth year] Today's Date: 01/03/2021   History of Present Illness Pt is a 80 y/o male admitted secondary to L sided numbness and speech deficits. 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 s/p R basilar artery stent on 12/2. PMH includes HTN.    PT Comments    Patient seen following above procedure. Patient continues to demonstrate unsteadiness with no AD. Patient with improved stability with use of RW. Patient at supervision level for mobility with use of RW. Patient reports double vision intermittently and especially with looking at objects far away. Continue to recommend OPPT to address balance and strength deficits.     Recommendations for follow up therapy are one component of a multi-disciplinary discharge planning process, led by the attending physician.  Recommendations may be updated based on patient status, additional functional criteria and insurance authorization.  Follow Up Recommendations  Outpatient PT (neuro)     Assistance Recommended at Discharge Intermittent Supervision/Assistance  Equipment Recommendations  Rolling Oakleigh Hesketh (2 wheels)    Recommendations for Other Services       Precautions / Restrictions Precautions Precautions: Fall Precaution Comments: BP 120-140 Restrictions Weight Bearing Restrictions: No     Mobility  Bed Mobility Overal bed mobility: Needs Assistance Bed Mobility: Supine to Sit     Supine to sit: Supervision          Transfers Overall transfer level: Needs assistance Equipment used: Rolling Brock Larmon (2 wheels) Transfers: Sit to/from Stand Sit to Stand: Supervision           General transfer comment: supervision for safety    Ambulation/Gait Ambulation/Gait assistance: Supervision Gait Distance (Feet): 250 Feet Assistive device: Rolling Chanee Henrickson (2 wheels) Gait Pattern/deviations: Step-through pattern;Decreased stride  length Gait velocity: Decreased     General Gait Details: use of RW for stability as patient remains unsteady without AD. Supervision for safety   Stairs             Wheelchair Mobility    Modified Rankin (Stroke Patients Only) Modified Rankin (Stroke Patients Only) Pre-Morbid Rankin Score: No symptoms Modified Rankin: Moderately severe disability     Balance Overall balance assessment: Needs assistance Sitting-balance support: No upper extremity supported;Feet supported Sitting balance-Leahy Scale: Good     Standing balance support: Bilateral upper extremity supported Standing balance-Leahy Scale: Fair                              Cognition Arousal/Alertness: Awake/alert Behavior During Therapy: WFL for tasks assessed/performed Overall Cognitive Status: Within Functional Limits for tasks assessed                                          Exercises      General Comments        Pertinent Vitals/Pain Pain Assessment: No/denies pain    Home Living                          Prior Function            PT Goals (current goals can now be found in the care plan section) Acute Rehab PT Goals Patient Stated Goal: to go home PT Goal Formulation: With patient Time For Goal Achievement: 01/15/21 Potential to Achieve Goals: Good Progress  towards PT goals: Progressing toward goals    Frequency    Min 4X/week      PT Plan Current plan remains appropriate    Co-evaluation              AM-PAC PT "6 Clicks" Mobility   Outcome Measure  Help needed turning from your back to your side while in a flat bed without using bedrails?: None Help needed moving from lying on your back to sitting on the side of a flat bed without using bedrails?: A Little Help needed moving to and from a bed to a chair (including a wheelchair)?: A Little Help needed standing up from a chair using your arms (e.g., wheelchair or bedside  chair)?: A Little Help needed to walk in hospital room?: A Little Help needed climbing 3-5 steps with a railing? : A Little 6 Click Score: 19    End of Session Equipment Utilized During Treatment: Gait belt Activity Tolerance: Patient tolerated treatment well Patient left: in chair;with call bell/phone within reach;with chair alarm set Nurse Communication: Mobility status PT Visit Diagnosis: Unsteadiness on feet (R26.81);Ataxic gait (R26.0);Other symptoms and signs involving the nervous system (R29.898)     Time: 1007-1219 PT Time Calculation (min) (ACUTE ONLY): 23 min  Charges:  $Gait Training: 23-37 mins                     Anayansi Rundquist A. Dan Humphreys PT, DPT Acute Rehabilitation Services Pager [phone] Office [phone]    Viviann Spare 01/03/2021, 12:58 PM

## 2021-01-03 NOTE — Progress Notes (Signed)
PROGRESS NOTE  Kevin Santiago JJK:[id number] DOB: 1940/10/04 DOA: 12/31/2020 PCP: Patient, No Pcp Per (Inactive)   LOS: 1 day   Brief Narrative / Interim history: Kevin Santiago is a 80 y.o. male with medical history significant for hypertension, hyperlipidemia who is admitted to [facility] on 12/31/2020 with acute ischemic strokeafter presenting from home to [facility] ED complaining of  left-sided numbness.  She was found to have a pons infarct.  Neuro IR also consulted and he is status post intracranial angioplasty/stenting of the basilar and intracranial right vertebral artery.  Being monitored in the ICU postprocedure  Subjective / 24h Interval events: Complains of double vision mainly when he looks to his right side.  Feels worse than yesterday.  Also complains of right lower extremity decree sensation.  Assessment & Plan: Principal Problem:   CVA (cerebral vascular accident) (HCC) Active Problems:   Hypertension   Hypercholesteremia   Anxiety   BPH (benign prostatic hyperplasia)   GERD (gastroesophageal reflux disease)   Stroke (cerebrum) (HCC)   Principal Problem Acute CVA-he was admitted to the hospital with intermittent right-sided blurry vision/dysarthria as well as poor balance and left-sided numbness.  MRI of the brain showed an acute infarct in the left pons as well as a tiny acute infarct in the right posterior pons.  Neurology consulted and followed patient while hospitalized.  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  2D echo showed LVEF 60-65%, no WMA, normal diastolic parameters.  Lipid panel showed an LDL of 128, he has placed on atorvastatin 80.  Hemoglobin A1c was  5.1.  Therapies recommended outpatient PT/OT which is being arranged by case management.  Neuro IR consulted and he is status post ntracranial angioplasty/stenting of the basilar and intracranial right vertebral artery.  Being monitored in the ICU postprocedure  Active Problems Essential hypertension-goal blood pressure 120-140 for 24-hour procedure, currently on Cleviprex and in the ICU.  Management per neurology  GAD-continue home medications\  BPH-continue home medications  GERD-continue home medications  Scheduled Meds:   stroke: mapping our early stages of recovery book   Does not apply Once   aspirin  81 mg Oral Daily   Or   aspirin  81 mg Per Tube Daily   atorvastatin  80 mg Oral Daily   Chlorhexidine Gluconate Cloth  6 each Topical Daily   cholecalciferol  2,000 Units Oral Daily   pantoprazole  40 mg Oral Daily   sertraline  25 mg Oral QHS   sodium chloride flush  3 mL Intravenous Once   tamsulosin  0.4 mg Oral QHS   ticagrelor  90 mg Oral BID   Or   ticagrelor  90 mg Per Tube BID   Continuous Infusions:  sodium chloride Stopped (01/03/21 0840)   clevidipine Stopped (01/03/21 0045)   PRN Meds:.acetaminophen **OR** acetaminophen (TYLENOL) oral liquid 160 mg/5 mL **OR** acetaminophen, acetaminophen **OR** acetaminophen, hydrALAZINE, ondansetron (ZOFRAN) IV, polyvinyl alcohol  Diet Orders (From admission, onward)     Start     Ordered   01/02/21 2110  Diet Heart Room service appropriate? Yes; Fluid consistency: Thin  Diet effective now       Question Answer Comment  Room service appropriate? Yes   Fluid consistency: Thin  01/02/21 2110            DVT prophylaxis: SCDs Start: 12/31/20 1958     Code Status: DNR  Family Communication: no family at bedside   Status is: Inpatient  Inpatient appropriate: ICU monitoring  Level of care: ICU  Consultants:  Neurology   Procedures:  2D echo  Microbiology  none  Antimicrobials: none     Objective: Vitals:   01/03/21 1000 01/03/21 1013 01/03/21 1100 01/03/21 1200  BP: (!) 150/99 (!) 155/74 117/68 (!) 146/80  Pulse: 67 67 68 66  Resp: 19 19 16 16   Temp:      TempSrc:      SpO2: 96% 95% 92% 98%  Weight:      Height:        Intake/Output Summary (Last 24 hours) at 01/03/2021 1223 Last data filed at 01/03/2021 1200 Gross per 24 hour  Intake 3105.27 ml  Output 2475 ml  Net 630.27 ml    Filed Weights   12/31/20 2210 01/03/21 0410  Weight: 79 kg 83.2 kg    Examination:  Constitutional: No distress Eyes: No scleral icterus ENMT: mmm Neck: normal, supple Respiratory: Clear bilaterally, no wheezing or crackles heard Cardiovascular: Regular rate and rhythm, no murmurs rubs gallops, no peripheral edema Abdomen: Soft, nontender, nondistended, bowel sounds positive Musculoskeletal: no clubbing / cyanosis.  Skin: No rashes seen Neurologic: Mild slurred speech present, no focal deficits   Data Reviewed: I have independently reviewed following labs and imaging studies   CBC: Recent Labs  Lab 12/31/20 0715 12/31/20 0730 01/01/21 0250  WBC 4.1  --  5.6  NEUTROABS 3.0  --   --   HGB 15.4 15.0 15.0  HCT 45.3 44.0 44.8  MCV 83.1  --  84.1  PLT 83*  --  92*    Basic Metabolic Panel: Recent Labs  Lab 12/31/20 0715 12/31/20 0730 01/01/21 0250  NA 138 141 137  K 3.9 3.9 3.9  CL 107 105 106  CO2 23  --  23  GLUCOSE 121* 119* 98  BUN 17 18 13   CREATININE 1.33* 1.30* 1.20  CALCIUM 9.2  --  9.0  MG  --   --  2.1    Liver Function Tests: Recent Labs  Lab 12/31/20 0715 01/01/21 0250  AST 22 19  ALT 20 20  ALKPHOS 50 45  BILITOT 1.0 1.6*  PROT 6.6 6.2*  ALBUMIN 4.3 4.0    Coagulation Profile: Recent Labs  Lab 12/31/20 0715  INR 1.0    HbA1C: Recent Labs    01/01/21 0250  HGBA1C 5.1    CBG: Recent Labs  Lab 12/31/20 0723  GLUCAP 127*     Recent Results (from the past 240 hour(s))  Resp Panel by RT-PCR (Flu A&B, Covid)  Nasopharyngeal Swab     Status: None   Collection Time: 01/01/21  9:23 AM   Specimen: Nasopharyngeal Swab; Nasopharyngeal(NP) swabs in vial transport medium  Result Value Ref Range Status   SARS Coronavirus 2 by RT PCR NEGATIVE NEGATIVE Final    Comment: (NOTE) SARS-CoV-2 target nucleic acids are NOT DETECTED.  The SARS-CoV-2 RNA is generally detectable in upper respiratory specimens during the acute phase of infection. The lowest concentration of SARS-CoV-2 viral copies this assay can detect is 138 copies/mL. A negative result does not preclude SARS-Cov-2 infection and should not be used as the sole basis for treatment or other patient management decisions. A negative result may occur with  improper specimen collection/handling, submission  of specimen other than nasopharyngeal swab, presence of viral mutation(s) within the areas targeted by this assay, and inadequate number of viral copies(<138 copies/mL). A negative result must be combined with clinical observations, patient history, and epidemiological information. The expected result is Negative.  Fact Sheet for Patients:  BloggerCourse.com  Fact Sheet for Healthcare Providers:  SeriousBroker.it  This test is no t yet approved or cleared by the [place] FDA and  has been authorized for detection and/or diagnosis of SARS-CoV-2 by FDA under an Emergency Use Authorization (EUA). This EUA will remain  in effect (meaning this test can be used) for the duration of the COVID-19 declaration under Section 564(b)(1) of the Act, 21 U.S.C.section 360bbb-3(b)(1), unless the authorization is terminated  or revoked sooner.       Influenza A by PCR NEGATIVE NEGATIVE Final   Influenza B by PCR NEGATIVE NEGATIVE Final    Comment: (NOTE) The Xpert Xpress SARS-CoV-2/FLU/RSV plus assay is intended as an aid in the diagnosis of influenza from Nasopharyngeal swab specimens and should not be  used as a sole basis for treatment. Nasal washings and aspirates are unacceptable for Xpert Xpress SARS-CoV-2/FLU/RSV testing.  Fact Sheet for Patients: BloggerCourse.com  Fact Sheet for Healthcare Providers: SeriousBroker.it  This test is not yet approved or cleared by the [place] FDA and has been authorized for detection and/or diagnosis of SARS-CoV-2 by FDA under an Emergency Use Authorization (EUA). This EUA will remain in effect (meaning this test can be used) for the duration of the COVID-19 declaration under Section 564(b)(1) of the Act, 21 U.S.C. section 360bbb-3(b)(1), unless the authorization is terminated or revoked.  Performed at [facility], [address], [place], [place] [address]   MRSA Next Gen by PCR, Nasal     Status: None   Collection Time: 01/02/21  8:34 PM   Specimen: Nasal Mucosa; Nasal Swab  Result Value Ref Range Status   MRSA by PCR Next Gen NOT DETECTED NOT DETECTED Final    Comment: (NOTE) The GeneXpert MRSA Assay (FDA approved for NASAL specimens only), is one component of a comprehensive MRSA colonization surveillance program. It is not intended to diagnose MRSA infection nor to guide or monitor treatment for MRSA infections. Test performance is not FDA approved in patients less than 38 years old. Performed at [facility], [address], [place], [place] [address]       Radiology Studies: CT HEAD WO CONTRAST ( )  Result Date: 01/03/2021 CLINICAL DATA:  80 year old male with severe vertebrobasilar atherosclerosis and small acute brainstem infarcts status post intracranial angioplasty and stent of the distal right vertebral and basilar arteries postprocedure day 1. EXAM: CT HEAD WITHOUT CONTRAST TECHNIQUE: Contiguous axial images were obtained from the base of the skull through the vertex without intravenous contrast. COMPARISON:  Brain MRI 12/31/2020.  CT head 12/31/2020.  FINDINGS: Brain: Virtually stable gray-white matter differentiation throughout the posterior fossa. Subtle hypodensity in the left pons likely mild cytotoxic edema corresponding to the known infarct. No hemorrhage or mass effect. Stable supratentorial gray-white matter differentiation. No superimposed midline shift, ventriculomegaly, mass effect, evidence of mass lesion, intracranial hemorrhage or new cortically based acute infarction. Vascular: Calcified atherosclerosis at the skull base. New vascular stents in the right vertebral artery V4 segment and proximal through mid basilar artery. No suspicious intracranial vascular hyperdensity. Skull: No acute osseous abnormality identified. Previous anterior left maxilla ORIF. Sinuses/Orbits: Chronic left sphenoid sinusitis. Other Visualized paranasal sinuses and mastoids are clear. Other: No  acute orbit or scalp soft tissue finding. IMPRESSION: 1. Expected CT appearance of small left pontine infarct. No hemorrhage or mass effect. 2. New vascular stents in the right vertebral V4 segment and Basilar Artery. No adverse features identified. 3. No new intracranial abnormality. Electronically Signed   By: Odessa Fleming M.D.   On: 01/03/2021 09:29     Pamella Pert, MD, PhD [facility]  Between 7 am - 7 pm I am available, please contact me via Amion (for emergencies) or Securechat (non urgent messages)  Between 7 pm - 7 am I am not available, please contact night coverage MD/APP via Amion

## 2021-01-03 NOTE — Progress Notes (Addendum)
STROKE TEAM PROGRESS NOTE   SUBJECTIVE (INTERVAL HISTORY) No family is at the bedside. Patient awake alert, moving all extremities, cranial nerve intact except that mild subjective diplopia with left gaze.  No disconjugate gaze on exam.  Patient stated that he had mild diplopia 2 days ago but that resolved yesterday and before procedure.  Post procedure he had again mild diplopia more with left gaze.  Otherwise, patient has no complaints.  BP stable on low-dose Cleviprex, BP goal 1 20-1 40 for 24 hours postprocedure. Patient reports dry eyes, artifical tears ordered. Patient does report intermittent pain and changes in sensation in his bilateral legs. Repeat CT ordered.    OBJECTIVE Temp:  [97.6 F (36.4 C)-98.1 F (36.7 C)] 98 F (36.7 C) (12/03 0400) Pulse Rate:  [54-82] 69 (12/03 0800) Cardiac Rhythm: Normal sinus rhythm (12/03 0800) Resp:  [14-26] 26 (12/03 0800) BP: (89-168)/(42-86) 112/68 (12/03 0700) SpO2:  [90 %-99 %] 93 % (12/03 0800) Arterial Line BP: (106-163)/(43-64) 134/55 (12/03 0800) Weight:  [83.2 kg] 83.2 kg (12/03 0410)  Recent Labs  Lab 12/31/20 0723  GLUCAP 127*    Recent Labs  Lab 12/31/20 0715 12/31/20 0730 01/01/21 0250  NA 138 141 137  K 3.9 3.9 3.9  CL 107 105 106  CO2 23  --  23  GLUCOSE 121* 119* 98  BUN 17 18 13   CREATININE 1.33* 1.30* 1.20  CALCIUM 9.2  --  9.0  MG  --   --  2.1    Recent Labs  Lab 12/31/20 0715 01/01/21 0250  AST 22 19  ALT 20 20  ALKPHOS 50 45  BILITOT 1.0 1.6*  PROT 6.6 6.2*  ALBUMIN 4.3 4.0    Recent Labs  Lab 12/31/20 0715 12/31/20 0730 01/01/21 0250  WBC 4.1  --  5.6  NEUTROABS 3.0  --   --   HGB 15.4 15.0 15.0  HCT 45.3 44.0 44.8  MCV 83.1  --  84.1  PLT 83*  --  92*    No results for input(s): CKTOTAL, CKMB, CKMBINDEX, TROPONINI in the last 168 hours. No results for input(s): LABPROT, INR in the last 72 hours.  No results for input(s): COLORURINE, LABSPEC, PHURINE, GLUCOSEU, HGBUR, BILIRUBINUR,  KETONESUR, PROTEINUR, UROBILINOGEN, NITRITE, LEUKOCYTESUR in the last 72 hours.  Invalid input(s): APPERANCEUR     Component Value Date/Time   CHOL 184 01/01/2021 0250   TRIG 121 01/01/2021 0250   HDL 32 (L) 01/01/2021 0250   CHOLHDL 5.8 01/01/2021 0250   VLDL 24 01/01/2021 0250   LDLCALC 128 (H) 01/01/2021 0250   Lab Results  Component Value Date   HGBA1C 5.1 01/01/2021   No results found for: LABOPIA, COCAINSCRNUR, LABBENZ, AMPHETMU, THCU, LABBARB  No results for input(s): ETH in the last 168 hours.  I have personally reviewed the radiological images below and agree with the radiology interpretations.  CT ANGIO HEAD NECK W WO CM  Result Date: 12/31/2020 CLINICAL DATA:  Stroke/TIA.  Left-sided numbness and weakness. EXAM: CT ANGIOGRAPHY HEAD AND NECK TECHNIQUE: Multidetector CT imaging of the head and neck was performed using the standard protocol during bolus administration of intravenous contrast. Multiplanar CT image reconstructions and MIPs were obtained to evaluate the vascular anatomy. Carotid stenosis measurements (when applicable) are obtained utilizing NASCET criteria, using the distal internal carotid diameter as the denominator. CONTRAST:  10mL OMNIPAQUE IOHEXOL 350 MG/ML SOLN COMPARISON:  CT head 12/31/2020 FINDINGS: CTA NECK FINDINGS Aortic arch: Minimal atherosclerotic disease aortic arch. Proximal great vessels  widely patent. Right carotid system: Mild atherosclerotic disease right carotid bifurcation without significant stenosis. Left carotid system: Mild atherosclerotic disease left carotid bifurcation without stenosis Vertebral arteries: Right vertebral artery dominant and widely patent Small left vertebral artery is occluded in the mid neck with faint reconstitution distally with very small vessel. Skeleton: ACDF C4 through C6. No definite solid fusion at C4-5. Posterior cerclage wires at C3-4. Posterior screw and plate fusion C2 through C4. No acute skeletal abnormality.  Other neck: Left lower pole thyroid nodule 2.6 cm. Right lower pole thyroid nodule 11.8 cm. Recommend thyroid ultrasound (ref: J Am Coll Radiol. 2015 Feb;12(2): 143-50). Upper chest: Lung apices clear bilaterally. Review of the MIP images confirms the above findings CTA HEAD FINDINGS Anterior circulation: Mild atherosclerotic disease in the cavernous carotid bilaterally without stenosis. Anterior and middle cerebral arteries widely patent bilaterally. 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 Right PICA is patent. Occlusion of the mid left vertebral artery with reconstitution distally. Left vertebral artery is small and diseased vessel which supplies left PICA and small contribution to the basilar. Atherosclerotic irregularity and moderate stenosis in the basilar artery. Posterior cerebral arteries patent bilaterally. Fetal origin right posterior cerebral artery. Venous sinuses: Normal venous enhancement Anatomic variants: None Review of the MIP images confirms the above findings IMPRESSION: 1. Negative for intracranial large vessel occlusion. 2. 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 3. Diffusely disease left vertebral artery which is small and occluded in the mid segment. Faint reconstitution distally. 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 Moderate stenosis basilar. 4. Bilateral thyroid nodules. Largest nodule on the left 2.6 cm. Recommend thyroid ultrasound. (Ref: J Am Coll Radiol. 2015 Feb;12(2): 143-50). Electronically Signed   By: Marlan Palau M.D.   On: 12/31/2020 18:09   CT HEAD WO CONTRAST  Result Date: 12/31/2020 CLINICAL DATA:  Neuro deficit, acute, stroke suspected EXAM: CT HEAD WITHOUT CONTRAST TECHNIQUE: Contiguous axial images were obtained from the base of the skull through the vertex without intravenous contrast.  COMPARISON:  2015 FINDINGS: Brain: There is no acute intracranial hemorrhage, mass effect, or edema. Gray-white differentiation is preserved. There is no extra-axial fluid collection. Ventricles and sulci are within normal limits in size and configuration. Vascular: There is atherosclerotic calcification at the skull base. Skull: Calvarium is unremarkable. Sinuses/Orbits: Paranasal sinus mucosal thickening. Orbits are unremarkable. Other: None. IMPRESSION: No acute intracranial hemorrhage or evidence of acute infarction. Electronically Signed   By: Guadlupe Spanish M.D.   On: 12/31/2020 08:15   MR BRAIN WO CONTRAST  Result Date: 12/31/2020 CLINICAL DATA:  Stroke/TIA.  Left-sided numbness and weakness. EXAM: MRI HEAD WITHOUT CONTRAST TECHNIQUE: Multiplanar, multiecho pulse sequences of the brain and surrounding structures were obtained without intravenous contrast. 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 Small area of acute infarct in the right posterior pons in the floor of the fourth ventricle. Ventricle size and cerebral volume normal. Negative for hemorrhage or mass. Normal white matter. Vascular: Normal arterial flow voids Skull and upper cervical spine: Negative Sinuses/Orbits: Mucosal edema paranasal sinuses.  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 Tiny acute infarct in the right posterior pons. No significant chronic ischemia Electronically Signed   By: Marlan Palau M.D.   On: 12/31/2020 19:14   ECHOCARDIOGRAM COMPLETE  Result Date: 01/01/2021    ECHOCARDIOGRAM REPORT   Patient Name:  Kevin Santiago Date of Exam: 01/01/2021 Medical Rec #:  [id number]     Height:       70.0 in Accession #:    [id number]    Weight:       174.2 lb Date of Birth:  May 12, 1940    BSA:          1.968 m Patient Age:    80 years      BP:           125/81 mmHg Patient Gender: M             HR:           49 bpm. Exam Location:  Inpatient Procedure: 2D Echo,  Cardiac Doppler and Color Doppler Indications:    Stroke  History:        Patient has no prior history of Echocardiogram examinations.                 Risk Factors:Hypertension.  Sonographer:    Cleatis Polka Referring Phys: [id number] JUSTIN B HOWERTER IMPRESSIONS  1. Left ventricular ejection fraction, by estimation, is 60 to 65%. The left ventricle has normal function. The left ventricle has no regional wall motion abnormalities. There is mild left ventricular hypertrophy. Left ventricular diastolic parameters were normal.  2. Right ventricular systolic function is normal. The right ventricular size is normal. There is normal pulmonary artery systolic pressure.  3. The mitral valve is normal in structure. Trivial mitral valve regurgitation. No evidence of mitral stenosis.  4. The aortic valve is tricuspid. Aortic valve regurgitation is not visualized. Aortic valve sclerosis is present, with no evidence of aortic valve stenosis.  5. Aortic dilatation noted. There is mild dilatation of the ascending aorta, measuring 43 mm.  6. The inferior vena cava is dilated in size with >50% respiratory variability, suggesting right atrial pressure of 8 mmHg. Comparison(s): No prior Echocardiogram. FINDINGS  Left Ventricle: Left ventricular ejection fraction, by estimation, is 60 to 65%. The left ventricle has normal function. The left ventricle has no regional wall motion abnormalities. The left ventricular internal cavity size was normal in size. There is  mild left ventricular hypertrophy. Left ventricular diastolic parameters were normal. Right Ventricle: The right ventricular size is normal. Right ventricular systolic function is normal. There is normal pulmonary artery systolic pressure. The tricuspid regurgitant velocity is 2.50 m/s, and with an assumed right atrial pressure of 8 mmHg,  the estimated right ventricular systolic pressure is 33.0 mmHg. Left Atrium: Left atrial size was normal in size. Right Atrium: Right atrial  size was normal in size. Pericardium: There is no evidence of pericardial effusion. Mitral Valve: The mitral valve is normal in structure. Trivial mitral valve regurgitation. No evidence of mitral valve stenosis. Tricuspid Valve: The tricuspid valve is normal in structure. Tricuspid valve regurgitation is mild . No evidence of tricuspid stenosis. Aortic Valve: The aortic valve is tricuspid. Aortic valve regurgitation is not visualized. Aortic valve sclerosis is present, with no evidence of aortic valve stenosis. Aortic valve peak gradient measures 6.0 mmHg. Pulmonic Valve: The pulmonic valve was normal in structure. Pulmonic valve regurgitation is trivial. No evidence of pulmonic stenosis. Aorta: Aortic dilatation noted. There is mild dilatation of the ascending aorta, measuring 43 mm. Venous: The inferior vena cava is dilated in size with greater than 50% respiratory variability, suggesting right atrial pressure of 8 mmHg. IAS/Shunts: No atrial level shunt detected by color flow Doppler.  LEFT VENTRICLE PLAX 2D LVIDd:  4.40 cm     Diastology LVIDs:         2.60 cm     LV e' medial:    7.83 cm/s LV PW:         1.20 cm     LV E/e' medial:  9.3 LV IVS:        1.20 cm     LV e' lateral:   8.81 cm/s LVOT diam:     2.00 cm     LV E/e' lateral: 8.3 LV SV:         78 LV SV Index:   40 LVOT Area:     3.14 cm  LV Volumes (MOD) LV vol d, MOD A2C: 89.2 ml LV vol d, MOD A4C: 90.3 ml LV vol s, MOD A2C: 35.6 ml LV vol s, MOD A4C: 38.2 ml LV SV MOD A2C:     53.6 ml LV SV MOD A4C:     90.3 ml LV SV MOD BP:      52.4 ml RIGHT VENTRICLE             IVC RV Basal diam:  3.30 cm     IVC diam: 2.20 cm RV Mid diam:    2.40 cm RV S prime:     10.80 cm/s TAPSE (M-mode): 2.1 cm LEFT ATRIUM             Index        RIGHT ATRIUM           Index LA diam:        4.20 cm 2.13 cm/m   RA Area:     16.90 cm LA Vol (A2C):   42.2 ml 21.44 ml/m  RA Volume:   45.10 ml  22.91 ml/m LA Vol (A4C):   40.6 ml 20.63 ml/m LA Biplane Vol: 42.0 ml  21.34 ml/m  AORTIC VALVE AV Area (Vmax): 2.81 cm AV Vmax:        122.00 cm/s AV Peak Grad:   6.0 mmHg LVOT Vmax:      109.00 cm/s LVOT Vmean:     76.300 cm/s LVOT VTI:       0.249 m  AORTA Ao Root diam: 3.20 cm Ao Asc diam:  3.90 cm MITRAL VALVE               TRICUSPID VALVE MV Area (PHT): 3.53 cm    TR Peak grad:   25.0 mmHg MV Decel Time: 215 msec    TR Vmax:        250.00 cm/s MV E velocity: 73.10 cm/s MV A velocity: 74.40 cm/s  SHUNTS MV E/A ratio:  0.98        Systemic VTI:  0.25 m                            Systemic Diam: 2.00 cm Olga Millers MD Electronically signed by Olga Millers MD Signature Date/Time: 01/01/2021/11:51:11 AM    Final       PHYSICAL EXAM  Temp:  [97.6 F (36.4 C)-98.1 F (36.7 C)] 98 F (36.7 C) (12/03 0400) Pulse Rate:  [54-82] 69 (12/03 0800) Resp:  [14-26] 26 (12/03 0800) BP: (89-168)/(42-86) 112/68 (12/03 0700) SpO2:  [90 %-99 %] 93 % (12/03 0800) Arterial Line BP: (106-163)/(43-64) 134/55 (12/03 0800) Weight:  [83.2 kg] 83.2 kg (12/03 0410)  General - Well nourished, well developed, in no apparent distress.  Ophthalmologic - fundi not visualized due  to noncooperation.  Cardiovascular - Regular rhythm and rate.  Mental Status -  Level of arousal and orientation to time, place, and person were intact. Language including expression, naming, repetition, comprehension was assessed and found intact.   Cranial Nerves II - XII - II - Visual field intact OU. III, IV, VI - Extraocular movements intact.  Subjective mild diplopia on the left gaze with far vision but normal at arms length. V - Facial sensation intact bilaterally. VII - Facial movement intact bilaterally. VIII - Hearing & vestibular intact bilaterally. X - Palate elevates symmetrically. XI - Chin turning & shoulder shrug intact bilaterally. XII - Tongue protrusion intact.  Motor Strength - The patient's strength was normal in all extremities and pronator drift was absent.  Bulk was normal  and fasciculations were absent.   Motor Tone - Muscle tone was assessed at the neck and appendages and was normal.  Reflexes - The patient's reflexes were symmetrical in all extremities and he had no pathological reflexes.  Sensory - Light touch, temperature/pinprick were assessed and were symmetrical.    Coordination - The patient had normal movements in the hands and left foot with no ataxia or dysmetria.  Normal FFMs.Tremor was absent.  Gait and Station - deferred.   ASSESSMENT/PLAN Kevin Santiago is a 80 y.o. male with history of hypertension, hyperlipidemia, depression admitted for mild double vision, imbalance, left-sided numbness and dysarthria. No tPA given due to outside window.  Patient returned to the Neuro ICU after successful angioplasty/stenting of the basilar and intracranial right vertebral artery. Continue aggressive BP management for 24 hours post procedure.  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 CT no acute finding CT- 12/31. Expected CT appearance of small left pontine infarct. No hemorrhage or mass effect. New vascular stents in the right vertebral V4 segment and Basilar Artery. No adverse features identified. CT head and neck mid basilar arteries severe stenosis, right VA tandem stenosis, L VA occlusion MRI left mid pontine infarct, right ventral pontine punctate infarct. 2D Echo EF 60 to 65% LDL 128 HgbA1c 5.1 SCDs for VTE prophylaxis No antithrombotic prior to admission, now on aspirin 81 mg daily and Brilinta (ticagrelor) 90 mg bid after loading. Patient counseled to be compliant with his antithrombotic medications Ongoing aggressive stroke risk factor management Therapy recommendations: Outpatient PT Disposition: Pending  Posterior circulation severe stenosis CT head and neck mid basilar arteries severe stenosis, right VA tandem stenosis, L  VA occlusion Likely the  cause of current stroke S/p angiogram and R VA and BA stenting 01/02/2021 with Dr. Sherlon Handing  On aspirin and Brilinta   Hypertension Stable Avoid low BP BP goal 120-140 24h post intervention Long term BP goal normotensive after   Hyperlipidemia Home meds: Pravastatin 40 LDL 128, goal < 70 Now on Lipitor 80 Continue statin at discharge  Other Stroke Risk Factors Advanced age  Other Active Problems Depression on Zoloft BPH- continue home medications GERD- Continue home medications  Hospital day # 1  Patient seen and examined by NP/APP with MD. MD to update note as needed.   Elmer Picker, DNP, FNP-BC [facility] Pager: [phone]  ATTENDING ATTESTATION:  Pt with pontine stroke with minimal symptoms on exam. Subjective diplopia only with far away objects and intermittent. He does report dry eyes and does not have his drops, this maybe cause of his blurry vision. Will add ggts to see if this improve tomorrow.  Repeat CT this am was remarkable only for post procedural stents.  Discussed findings with pt and provided reassurance.   Dr. Viviann Spare evaluated pt independently, reviewed imaging, chart, labs. Discussed and formulated plan with the APP. Please see APP note above for details.      This patient is critically ill due to respiratory distress, stroke and at significant risk of neurological worsening, death form heart failure, respiratory failure, recurrent stroke, bleeding from [facility], seizure, sepsis. This patient's care requires constant monitoring of vital signs, hemodynamics, respiratory and cardiac monitoring, review of multiple databases, neurological assessment, discussion with family, other specialists and medical decision making of high complexity. I spent 35 minutes of neurocritical care time in the care of this patient.   Eyan Hagood,MD    To contact Stroke Continuity provider, please refer to [email or web address]. After hours, contact General Neurology

## 2021-01-03 NOTE — Progress Notes (Signed)
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  Still some double vision when focusing on something/one for more than a few seconds.  Endorses posterior headache at base of skull--reports it is present all the time, but worse today.  Feels weak and unable to balance without walker.  Speech and left sided numbness are resolved.  Allergies: Other and Penicillins  Medications: Prior to Admission medications   Medication Sig Start Date End Date Taking? Authorizing Provider  cholecalciferol (VITAMIN D) 1000 UNITS tablet Take 2,000 Units by mouth daily.   Yes [provider]  fluorouracil (EFUDEX) 5 % cream Apply 1 application topically daily as needed. Skin basil cell areas as directed 07/18/20  Yes [provider]  hydrocortisone (ANUSOL-HC) 2.5 % rectal cream Place 1 application rectally 2 (two) times daily. Patient taking differently: Place 1 application rectally 2 (two) times daily as needed for itching. 06/30/13  Yes Young, Dillard Cannon, PA-C  hydroxypropyl methylcellulose (ISOPTO TEARS) 2.5 % ophthalmic solution Place 2 drops into both eyes 2 (two) times daily as needed for dry eyes.   Yes [provider]  losartan (COZAAR) 50 MG tablet Take 25 mg by mouth daily. 02/29/20  Yes [provider]  omeprazole (PRILOSEC) 40 MG capsule Take 40 mg by mouth daily as needed (ACID REFLUX).   Yes [provider]  ondansetron (ZOFRAN ODT) 4 MG disintegrating tablet Take 1 tablet (4 mg total) by mouth every 6 (six) hours as needed for nausea or vomiting. 06/26/13  Yes Pricilla Loveless, MD  pravastatin (PRAVACHOL) 40 MG tablet Take 40 mg by mouth at bedtime.   Yes [provider]  sertraline (ZOLOFT) 25 MG tablet Take 25 mg by mouth at bedtime.   Yes [provider]   tamsulosin (FLOMAX) 0.4 MG CAPS capsule Take 0.4 mg by mouth at bedtime.   Yes [provider]  ferrous sulfate 325 (65 FE) MG tablet Take 325 mg by mouth every other day. 02/29/20   [provider]     Vital Signs: BP 118/62 Comment: going by a line  Pulse 69   Temp 98.2 F (36.8 C) (Axillary)   Resp 16   Ht  (1.778 m)   Wt 183 lb 6.8 oz (83.2 kg)   SpO2 93%   BMI 26.32 kg/m   Arterial line reading systolic between 914-782 while in room  Physical Exam Vitals reviewed.  Constitutional:      General: He is not in acute distress. HENT:     Head: Normocephalic and atraumatic.  Eyes:     Extraocular Movements: Extraocular movements intact.  Cardiovascular:     Rate and Rhythm: Normal rate and regular rhythm.     Pulses: Normal pulses.  Pulmonary:     Effort: Pulmonary effort is normal.  Abdominal:     General: Abdomen is flat.     Palpations: Abdomen is soft.  Skin:    General: Skin is warm and dry.     Comments: Right radial puncture site with positive pulses, no significant hematoma, and no bleeding  Neurological:     General: No focal deficit present.     Mental Status: He is alert and oriented to person, place, and time.     Cranial Nerves: Cranial nerves 2-12 are intact. No dysarthria or  facial asymmetry.     Sensory: Sensation is intact.     Imaging: CT ANGIO HEAD NECK W WO CM  Result Date: 12/31/2020 CLINICAL DATA:  Stroke/TIA.  Left-sided numbness and weakness. EXAM: CT ANGIOGRAPHY HEAD AND NECK TECHNIQUE: Multidetector CT imaging of the head and neck was performed using the standard protocol during bolus administration of intravenous contrast. Multiplanar CT image reconstructions and MIPs were obtained to evaluate the vascular anatomy. Carotid stenosis measurements (when applicable) are obtained utilizing NASCET criteria, using the distal internal carotid diameter as the denominator. CONTRAST:  71mL OMNIPAQUE IOHEXOL 350 MG/ML SOLN  COMPARISON:  CT head 12/31/2020 FINDINGS: CTA NECK FINDINGS Aortic arch: Minimal atherosclerotic disease aortic arch. Proximal great vessels widely patent. Right carotid system: Mild atherosclerotic disease right carotid bifurcation without significant stenosis. Left carotid system: Mild atherosclerotic disease left carotid bifurcation without stenosis Vertebral arteries: Right vertebral artery dominant and widely patent Small left vertebral artery is occluded in the mid neck with faint reconstitution distally with very small vessel. Skeleton: ACDF C4 through C6. No definite solid fusion at C4-5. Posterior cerclage wires at C3-4. Posterior screw and plate fusion C2 through C4. No acute skeletal abnormality. Other neck: Left lower pole thyroid nodule 2.6 cm. Right lower pole thyroid nodule 11.8 cm. Recommend thyroid ultrasound (ref: J Am Coll Radiol. 2015 Feb;12(2): 143-50). Upper chest: Lung apices clear bilaterally. Review of the MIP images confirms the above findings CTA HEAD FINDINGS Anterior circulation: Mild atherosclerotic disease in the cavernous carotid bilaterally without stenosis. Anterior and middle cerebral arteries widely patent bilaterally. 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 Right PICA is patent. Occlusion of the mid left vertebral artery with reconstitution distally. Left vertebral artery is small and diseased vessel which supplies left PICA and small contribution to the basilar. Atherosclerotic irregularity and moderate stenosis in the basilar artery. Posterior cerebral arteries patent bilaterally. Fetal origin right posterior cerebral artery. Venous sinuses: Normal venous enhancement Anatomic variants: None Review of the MIP images confirms the above findings IMPRESSION: 1. Negative for intracranial large vessel occlusion. 2. 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 3. Diffusely disease  left vertebral artery which is small and occluded in the mid segment. Faint reconstitution distally. 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 Moderate stenosis basilar. 4. Bilateral thyroid nodules. Largest nodule on the left 2.6 cm. Recommend thyroid ultrasound. (Ref: J Am Coll Radiol. 2015 Feb;12(2): 143-50). Electronically Signed   By: Marlan Palau M.D.   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 EXAM: CT HEAD WITHOUT CONTRAST TECHNIQUE: Contiguous axial images were obtained from the base of the skull through the vertex without intravenous contrast. COMPARISON:  Brain MRI 12/31/2020.  CT head 12/31/2020. FINDINGS: Brain: Virtually stable gray-white matter differentiation throughout the posterior fossa. Subtle hypodensity in the left pons likely mild cytotoxic edema corresponding to the known infarct. No hemorrhage or mass effect. Stable supratentorial gray-white matter differentiation. No superimposed midline shift, ventriculomegaly, mass effect, evidence of mass lesion, intracranial hemorrhage or new cortically based acute infarction. Vascular: Calcified atherosclerosis at the skull base. New vascular stents in the right vertebral artery V4 segment and proximal through mid basilar artery. No suspicious intracranial vascular hyperdensity. Skull:  No acute osseous abnormality identified. Previous anterior left maxilla ORIF. Sinuses/Orbits: Chronic left sphenoid sinusitis. Other Visualized paranasal sinuses and mastoids are clear. Other: No acute orbit or scalp soft tissue finding. IMPRESSION: 1. Expected CT appearance of small left pontine infarct. No hemorrhage or mass effect. 2. New  vascular stents in the right vertebral V4 segment and Basilar Artery. No adverse features identified. 3. No new intracranial abnormality. Electronically Signed   By: Odessa Fleming M.D.   On: 01/03/2021 09:29   CT HEAD WO CONTRAST  Result Date: 12/31/2020 CLINICAL DATA:  Neuro deficit, acute, stroke suspected EXAM: CT HEAD WITHOUT CONTRAST TECHNIQUE: Contiguous axial images were obtained from the base of the skull through the vertex without intravenous contrast. COMPARISON:  2015 FINDINGS: Brain: There is no acute intracranial hemorrhage, mass effect, or edema. Gray-white differentiation is preserved. There is no extra-axial fluid collection. Ventricles and sulci are within normal limits in size and configuration. Vascular: There is atherosclerotic calcification at the skull base. Skull: Calvarium is unremarkable. Sinuses/Orbits: Paranasal sinus mucosal thickening. Orbits are unremarkable. Other: None. IMPRESSION: No acute intracranial hemorrhage or evidence of acute infarction. Electronically Signed   By: Guadlupe Spanish M.D.   On: 12/31/2020 08:15   MR BRAIN WO CONTRAST  Result Date: 12/31/2020 CLINICAL DATA:  Stroke/TIA.  Left-sided numbness and weakness. EXAM: MRI HEAD WITHOUT CONTRAST TECHNIQUE: Multiplanar, multiecho pulse sequences of the brain and surrounding structures were obtained without intravenous contrast. 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 Small area of acute infarct in the right posterior pons in the floor of the fourth ventricle. Ventricle size and cerebral volume normal. Negative for hemorrhage or mass. Normal white matter. Vascular: Normal arterial flow voids Skull and upper cervical spine: Negative Sinuses/Orbits: Mucosal edema paranasal sinuses.  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 Tiny acute infarct in the right posterior pons. No significant chronic ischemia Electronically Signed   By: Marlan Palau M.D.   On:  12/31/2020 19:14   ECHOCARDIOGRAM COMPLETE  Result Date: 01/01/2021    ECHOCARDIOGRAM REPORT   Patient Name:   MANAV PIEROTTI [facility] Date of Exam: 01/01/2021 Medical Rec #:  [id number]     Height:       70.0 in Accession #:    [id number]    Weight:       174.2 lb Date of Birth:  Apr 29, 1940    BSA:          1.968 m Patient Age:    80 years      BP:           125/81 mmHg Patient Gender: M             HR:           49 bpm. Exam Location:  Inpatient Procedure: 2D Echo, Cardiac Doppler and Color Doppler Indications:    Stroke  History:        Patient has no prior history of Echocardiogram examinations.                 Risk Factors:Hypertension.  Sonographer:    Cleatis Polka Referring Phys: [id number] JUSTIN B HOWERTER IMPRESSIONS  1. Left ventricular ejection fraction, by estimation, is 60 to 65%. The left ventricle has normal function. The left ventricle has no regional wall motion abnormalities. There is mild left ventricular hypertrophy. Left ventricular diastolic parameters were normal.  2. Right ventricular systolic function is normal. The right ventricular size is  normal. There is normal pulmonary artery systolic pressure.  3. The mitral valve is normal in structure. Trivial mitral valve regurgitation. No evidence of mitral stenosis.  4. The aortic valve is tricuspid. Aortic valve regurgitation is not visualized. Aortic valve sclerosis is present, with no evidence of aortic valve stenosis.  5. Aortic dilatation noted. There is mild dilatation of the ascending aorta, measuring 43 mm.  6. The inferior vena cava is dilated in size with >50% respiratory variability, suggesting right atrial pressure of 8 mmHg. Comparison(s): No prior Echocardiogram. FINDINGS  Left Ventricle: Left ventricular ejection fraction, by estimation, is 60 to 65%. The left ventricle has normal function. The left ventricle has no regional wall motion abnormalities. The left ventricular internal cavity size was normal in size. There is  mild left  ventricular hypertrophy. Left ventricular diastolic parameters were normal. Right Ventricle: The right ventricular size is normal. Right ventricular systolic function is normal. There is normal pulmonary artery systolic pressure. The tricuspid regurgitant velocity is 2.50 m/s, and with an assumed right atrial pressure of 8 mmHg,  the estimated right ventricular systolic pressure is 33.0 mmHg. Left Atrium: Left atrial size was normal in size. Right Atrium: Right atrial size was normal in size. Pericardium: There is no evidence of pericardial effusion. Mitral Valve: The mitral valve is normal in structure. Trivial mitral valve regurgitation. No evidence of mitral valve stenosis. Tricuspid Valve: The tricuspid valve is normal in structure. Tricuspid valve regurgitation is mild . No evidence of tricuspid stenosis. Aortic Valve: The aortic valve is tricuspid. Aortic valve regurgitation is not visualized. Aortic valve sclerosis is present, with no evidence of aortic valve stenosis. Aortic valve peak gradient measures 6.0 mmHg. Pulmonic Valve: The pulmonic valve was normal in structure. Pulmonic valve regurgitation is trivial. No evidence of pulmonic stenosis. Aorta: Aortic dilatation noted. There is mild dilatation of the ascending aorta, measuring 43 mm. Venous: The inferior vena cava is dilated in size with greater than 50% respiratory variability, suggesting right atrial pressure of 8 mmHg. IAS/Shunts: No atrial level shunt detected by color flow Doppler.  LEFT VENTRICLE PLAX 2D LVIDd:         4.40 cm     Diastology LVIDs:         2.60 cm     LV e' medial:    7.83 cm/s LV PW:         1.20 cm     LV E/e' medial:  9.3 LV IVS:        1.20 cm     LV e' lateral:   8.81 cm/s LVOT diam:     2.00 cm     LV E/e' lateral: 8.3 LV SV:         78 LV SV Index:   40 LVOT Area:     3.14 cm  LV Volumes (MOD) LV vol d, MOD A2C: 89.2 ml LV vol d, MOD A4C: 90.3 ml LV vol s, MOD A2C: 35.6 ml LV vol s, MOD A4C: 38.2 ml LV SV MOD A2C:      53.6 ml LV SV MOD A4C:     90.3 ml LV SV MOD BP:      52.4 ml RIGHT VENTRICLE             IVC RV Basal diam:  3.30 cm     IVC diam: 2.20 cm RV Mid diam:    2.40 cm RV S prime:     10.80 cm/s TAPSE (M-mode): 2.1 cm LEFT ATRIUM  Index        RIGHT ATRIUM           Index LA diam:        4.20 cm 2.13 cm/m   RA Area:     16.90 cm LA Vol (A2C):   42.2 ml 21.44 ml/m  RA Volume:   45.10 ml  22.91 ml/m LA Vol (A4C):   40.6 ml 20.63 ml/m LA Biplane Vol: 42.0 ml 21.34 ml/m  AORTIC VALVE AV Area (Vmax): 2.81 cm AV Vmax:        122.00 cm/s AV Peak Grad:   6.0 mmHg LVOT Vmax:      109.00 cm/s LVOT Vmean:     76.300 cm/s LVOT VTI:       0.249 m  AORTA Ao Root diam: 3.20 cm Ao Asc diam:  3.90 cm MITRAL VALVE               TRICUSPID VALVE MV Area (PHT): 3.53 cm    TR Peak grad:   25.0 mmHg MV Decel Time: 215 msec    TR Vmax:        250.00 cm/s MV E velocity: 73.10 cm/s MV A velocity: 74.40 cm/s  SHUNTS MV E/A ratio:  0.98        Systemic VTI:  0.25 m                            Systemic Diam: 2.00 cm Olga Millers MD Electronically signed by Olga Millers MD Signature Date/Time: 01/01/2021/11:51:11 AM    Final     Labs:  CBC: Recent Labs    12/31/20 0715 12/31/20 0730 01/01/21 0250  WBC 4.1  --  5.6  HGB 15.4 15.0 15.0  HCT 45.3 44.0 44.8  PLT 83*  --  92*    COAGS: Recent Labs    12/31/20 0715  INR 1.0  APTT 27    BMP: Recent Labs    12/31/20 0715 12/31/20 0730 01/01/21 0250  NA 138 141 137  K 3.9 3.9 3.9  CL 107 105 106  CO2 23  --  23  GLUCOSE 121* 119* 98  BUN CALCIUM 9.2  --  9.0  CREATININE 1.33* 1.30* 1.20  GFRNONAA 55*  --  >60    LIVER FUNCTION TESTS: Recent Labs    12/31/20 0715 01/01/21 0250  BILITOT 1.0 1.6*  AST 22 19  ALT 20 20  ALKPHOS 50 45  PROT 6.6 6.2*  ALBUMIN 4.3 4.0    Assessment and Plan:  S/p basilar and intracranial right vertebral artery stenting --maintain SBP 120-140  --continue ASA and brilinta  --okay to d/c arterial  line    Electronically Signed: Sheliah Plane, PA 01/03/2021, 1:55 PM   I spent a total of 25 Minutes at the the patient's bedside AND on the patient's hospital floor or unit, greater than 50% of which was counseling/coordinating care for intracranial stent placement.

## 2021-01-03 NOTE — Progress Notes (Signed)
Patient complaining of decreased sensation/tingling in the right lower extremity. Also complaining of double vision when looking at distant objects, but no double vision close up. This has been ongoing and is not a new complaint.   Stroke MD made aware.   Beryl Meager, RN

## 2021-01-04 MED ORDER — ATORVASTATIN CALCIUM 80 MG PO TABS: 80.0000 mg | ORAL_TABLET | Freq: Every day | ORAL | 2 refills | Status: DC

## 2021-01-04 MED ORDER — TICAGRELOR 90 MG PO TABS
90.0000 mg | ORAL_TABLET | Freq: Two times a day (BID) | ORAL | 2 refills | Status: DC
Start: 2021-01-04 — End: 2021-07-07

## 2021-01-04 MED ORDER — ASPIRIN 81 MG PO CHEW: 81.0000 mg | CHEWABLE_TABLET | Freq: Every day | ORAL | 2 refills | Status: DC

## 2021-01-04 NOTE — Progress Notes (Signed)
Occupational Therapy Treatment Patient Details Name: Kevin Santiago MRN: [id number] DOB: 1940-07-12 Today's Date: 01/04/2021   History of present illness Pt is a 80 y/o male admitted secondary to L sided numbness and speech deficits. 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 s/p R basilar artery stent on 12/2. PMH includes HTN.   OT comments  Pt continues to complain of diplopia with distance vision "occasionally". Attempted partial occlusion however pt appears R eye dominanat and when nasal field of L eye occluded, pt complains of blurred vision with R eye. Updated DC recommendations to outpt OT to facilitate independence with IADL tasks given functional status change. Pt in agreement. Pt states his son will be able to assist as needed after DC.    Recommendations for follow up therapy are one component of a multi-disciplinary discharge planning process, led by the attending physician.  Recommendations may be updated based on patient status, additional functional criteria and insurance authorization.    Follow Up Recommendations  Outpatient OT    Assistance Recommended at Discharge Intermittent Supervision/Assistance  Equipment Recommendations  None recommended by OT    Recommendations for Other Services      Precautions / Restrictions Precautions Precautions: Fall       Mobility Bed Mobility Overal bed mobility: Modified Independent                  Transfers Overall transfer level: Modified independent     Sit to Stand: Modified independent (Device/Increase time)                 Balance Overall balance assessment: Needs assistance   Sitting balance-Leahy Scale: Good       Standing balance-Leahy Scale: Fair                             ADL either performed or assessed with clinical judgement   ADL                                         General ADL Comments: overall set up/S. Educated pt on need for his son to  be with him when doing ADL tasks. PT verblaized understanding    Extremity/Trunk Assessment Upper Extremity Assessment Upper Extremity Assessment: Overall WFL for tasks assessed   Lower Extremity Assessment Lower Extremity Assessment: Defer to PT evaluation        Vision   Vision Assessment?: Yes Diplopia Assessment: Disappears with one eye closed (occasionally in distant vision) Depth Perception: Overshoots;Undershoots Additional Comments: attempted to use partial occlusion however pt R eye dominant. When L eye partially occluded; pt has blurred vision R eye therefore did not help. Educated on visual tracking exercises; if R eye vision does not clear in a few months, recommend following up with his eye doctor.   Perception     Praxis      Cognition Arousal/Alertness: Awake/alert Behavior During Therapy: WFL for tasks assessed/performed Overall Cognitive Status: Within Functional Limits for tasks assessed                                            Exercises     Shoulder Instructions       General Comments  Pertinent Vitals/ Pain       Pain Assessment: No/denies pain  Home Living                                          Prior Functioning/Environment              Frequency  Min 2X/week        Progress Toward Goals  OT Goals(current goals can now be found in the care plan section)  Progress towards OT goals: Progressing toward goals  Acute Rehab OT Goals Patient Stated Goal: to get back to normal OT Goal Formulation: With patient Time For Goal Achievement: 01/15/21 Potential to Achieve Goals: Good ADL Goals Pt Will Perform Grooming: with modified independence;standing Pt Will Perform Lower Body Bathing: with modified independence;sit to/from stand Pt Will Perform Lower Body Dressing: with modified independence;sit to/from stand Pt Will Transfer to Toilet: with modified independence;ambulating;regular height  toilet;grab bars Pt Will Perform Toileting - Clothing Manipulation and hygiene: with modified independence;sit to/from stand Pt Will Perform Tub/Shower Transfer: Shower transfer;with modified independence;ambulating;shower seat;rolling walker  Plan Discharge plan needs to be updated    Co-evaluation                 AM-PAC OT "6 Clicks" Daily Activity     Outcome Measure   Help from another person eating meals?: None Help from another person taking care of personal grooming?: A Little Help from another person toileting, which includes using toliet, bedpan, or urinal?: A Little Help from another person bathing (including washing, rinsing, drying)?: A Little Help from another person to put on and taking off regular upper body clothing?: A Little Help from another person to put on and taking off regular lower body clothing?: A Little 6 Click Score: 19    End of Session Equipment Utilized During Treatment: Gait belt;Rolling walker (2 wheels)  OT Visit Diagnosis: Unsteadiness on feet (R26.81);Other abnormalities of gait and mobility (R26.89);Muscle weakness (generalized) (M62.81);Low vision, both eyes (H54.2)   Activity Tolerance Patient tolerated treatment well   Patient Left in chair;with call bell/phone within reach;with chair alarm set   Nurse Communication Mobility status;Other (comment) (DC needs)        Time: 1217-1239 OT Time Calculation (min): 22 min  Charges: OT General Charges $OT Visit: 1 Visit OT Treatments $Self Care/Home Management : 8-22 mins  Luisa Dago, OT/L   Acute OT Clinical Specialist Acute Rehabilitation Services Pager [phone] Office [phone]   [facility] 01/04/2021, 2:53 PM

## 2021-01-04 NOTE — Progress Notes (Signed)
PROGRESS NOTE  Kevin Santiago:[id number] DOB: 1940-04-12 DOA: 12/31/2020 PCP: Patient, No Pcp Per (Inactive)   LOS: 2 days    Brief Narrative / Interim history: Kevin Santiago is a 80 y.o. male with medical history significant for hypertension, hyperlipidemia who is admitted to [facility] on 12/31/2020 with acute ischemic strokeafter presenting from home to [facility] ED complaining of  left-sided numbness.  She was found to have a pons infarct.  Neuro IR also consulted and he is status post intracranial angioplasty/stenting of the basilar and intracranial right vertebral artery.  Monitored in the ICU postprocedure   Subjective: Continues to have double vision.  Complains of headache this morning.  No other complaints offered.     Assessment & Plan:  Acute CVA He was admitted to the hospital with intermittent right-sided blurry vision/dysarthria as well as poor balance and left-sided numbness.  MRI of the brain showed an acute infarct in the left pons as well as a tiny acute infarct in the right posterior pons.  Neurology consulted and followed patient while hospitalized.   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   Neurology is recommending aspirin and Brilinta. 2D echo showed LVEF 60-65%, no WMA, normal diastolic parameters.   Lipid panel showed an LDL of 128, he has placed on atorvastatin 80.   Hemoglobin A1c was 5.1.   Therapies recommended outpatient PT/OT which is being arranged by case management.   Neuro IR consulted and he is status post intracranial angioplasty/stenting of the basilar and intracranial right vertebral artery.   Continues to have diplopia.  Has headache this  morning.  Waiting on neurology input this morning.  Essential hypertension Goal blood pressure 120-140 for 24-hour procedure.  Was on Cleviprex postprocedure.  Management per neurology for now. Blood pressure reasonably well controlled.  Generalized anxiety disorder Continue sertraline.    BPH Continue Flomax   GERD Continue Protonix   DVT prophylaxis: SCDs CODE STATUS: DNR Family communication: No family at bedside.  Discussed with patient Disposition: Hopefully home when cleared by neurology.  Outpatient physical therapy recommended.   Scheduled Meds:   stroke: mapping our early stages of recovery book   Does not apply Once   aspirin  81 mg Oral Daily   Or   aspirin  81 mg Per Tube Daily   atorvastatin  80 mg Oral Daily   Chlorhexidine Gluconate Cloth  6 each Topical Daily   cholecalciferol  2,000 Units Oral Daily   pantoprazole  40 mg Oral Daily   sertraline  25 mg Oral QHS   sodium chloride flush  3 mL Intravenous Once   tamsulosin  0.4 mg Oral QHS   ticagrelor  90 mg Oral BID   Or   ticagrelor  90 mg Per Tube BID   Continuous Infusions:  sodium chloride Stopped (01/03/21 0840)   clevidipine Stopped (01/03/21 0045)   PRN Meds:.acetaminophen **OR** acetaminophen (TYLENOL) oral liquid 160 mg/5 mL **OR** acetaminophen, acetaminophen **OR** acetaminophen, hydrALAZINE, ondansetron (ZOFRAN) IV, polyvinyl alcohol  Diet Orders (From admission, onward)     Start     Ordered   01/02/21 2110  Diet Heart Room service appropriate? Yes; Fluid consistency: Thin  Diet effective now  Question Answer Comment  Room service appropriate? Yes   Fluid consistency: Thin      01/02/21 2110            Consultants:  Neurology   Procedures:  2D echo  Microbiology  none  Antimicrobials: none    Objective: Vitals:   01/04/21 0800 01/04/21 0900 01/04/21 1000 01/04/21 1100  BP: (!) 151/85 (!) 152/78 138/69 (!) 146/75  Pulse: (!) 58 64 (!) 59 (!) 55  Resp: 16 17  (!) 22 (!) 23  Temp:      TempSrc:      SpO2: 93% 95% 95% 93%  Weight:      Height:        Intake/Output Summary (Last 24 hours) at 01/04/2021 1119 Last data filed at 01/04/2021 1100 Gross per 24 hour  Intake 1790 ml  Output 1700 ml  Net 90 ml    Filed Weights   12/31/20 2210 01/03/21 0410  Weight: 79 kg 83.2 kg    Examination:  General appearance: Awake alert.  In no distress Resp: Clear to auscultation bilaterally.  Normal effort Cardio: S1-S2 is normal regular.  No S3-S4.  No rubs murmurs or bruit GI: Abdomen is soft.  Nontender nondistended.  Bowel sounds are present normal.  No masses organomegaly Extremities: No edema.  Full range of motion of lower extremities. Neurologic: Alert and oriented x3.  No focal neurological deficits.      Data Reviewed: I have independently reviewed following labs and imaging studies   CBC: Recent Labs  Lab 12/31/20 0715 12/31/20 0730 01/01/21 0250  WBC 4.1  --  5.6  NEUTROABS 3.0  --   --   HGB 15.4 15.0 15.0  HCT 45.3 44.0 44.8  MCV 83.1  --  84.1  PLT 83*  --  92*    Basic Metabolic Panel: Recent Labs  Lab 12/31/20 0715 12/31/20 0730 01/01/21 0250  NA 138 141 137  K 3.9 3.9 3.9  CL 107 105 106  CO2 23  --  23  GLUCOSE 121* 119* 98  BUN 17 18 13   CREATININE 1.33* 1.30* 1.20  CALCIUM 9.2  --  9.0  MG  --   --  2.1    Liver Function Tests: Recent Labs  Lab 12/31/20 0715 01/01/21 0250  AST 22 19  ALT 20 20  ALKPHOS 50 45  BILITOT 1.0 1.6*  PROT 6.6 6.2*  ALBUMIN 4.3 4.0    Coagulation Profile: Recent Labs  Lab 12/31/20 0715  INR 1.0    CBG: Recent Labs  Lab 12/31/20 0723  GLUCAP 127*     Recent Results (from the past 240 hour(s))  Resp Panel by RT-PCR (Flu A&B, Covid) Nasopharyngeal Swab     Status: None   Collection Time: 01/01/21  9:23 AM   Specimen: Nasopharyngeal Swab; Nasopharyngeal(NP) swabs in vial transport medium  Result Value Ref Range Status   SARS Coronavirus 2 by RT PCR  NEGATIVE NEGATIVE Final    Comment: (NOTE) SARS-CoV-2 target nucleic acids are NOT DETECTED.  The SARS-CoV-2 RNA is generally detectable in upper respiratory specimens during the acute phase of infection. The lowest concentration of SARS-CoV-2 viral copies this assay can detect is 138 copies/mL. A negative result does not preclude SARS-Cov-2 infection and should not be used as the sole basis for treatment or other patient management decisions. A negative result may occur with  improper specimen collection/handling, submission of specimen other than nasopharyngeal swab, presence of viral mutation(s) within the areas  targeted by this assay, and inadequate number of viral copies(<138 copies/mL). A negative result must be combined with clinical observations, patient history, and epidemiological information. The expected result is Negative.  Fact Sheet for Patients:  BloggerCourse.com  Fact Sheet for Healthcare Providers:  SeriousBroker.it  This test is no t yet approved or cleared by the [place] FDA and  has been authorized for detection and/or diagnosis of SARS-CoV-2 by FDA under an Emergency Use Authorization (EUA). This EUA will remain  in effect (meaning this test can be used) for the duration of the COVID-19 declaration under Section 564(b)(1) of the Act, 21 U.S.C.section 360bbb-3(b)(1), unless the authorization is terminated  or revoked sooner.       Influenza A by PCR NEGATIVE NEGATIVE Final   Influenza B by PCR NEGATIVE NEGATIVE Final    Comment: (NOTE) The Xpert Xpress SARS-CoV-2/FLU/RSV plus assay is intended as an aid in the diagnosis of influenza from Nasopharyngeal swab specimens and should not be used as a sole basis for treatment. Nasal washings and aspirates are unacceptable for Xpert Xpress SARS-CoV-2/FLU/RSV testing.  Fact Sheet for Patients: BloggerCourse.com  Fact Sheet for  Healthcare Providers: SeriousBroker.it  This test is not yet approved or cleared by the [place] FDA and has been authorized for detection and/or diagnosis of SARS-CoV-2 by FDA under an Emergency Use Authorization (EUA). This EUA will remain in effect (meaning this test can be used) for the duration of the COVID-19 declaration under Section 564(b)(1) of the Act, 21 U.S.C. section 360bbb-3(b)(1), unless the authorization is terminated or revoked.  Performed at [facility], [address], [place], [place] [address]   MRSA Next Gen by PCR, Nasal     Status: None   Collection Time: 01/02/21  8:34 PM   Specimen: Nasal Mucosa; Nasal Swab  Result Value Ref Range Status   MRSA by PCR Next Gen NOT DETECTED NOT DETECTED Final    Comment: (NOTE) The GeneXpert MRSA Assay (FDA approved for NASAL specimens only), is one component of a comprehensive MRSA colonization surveillance program. It is not intended to diagnose MRSA infection nor to guide or monitor treatment for MRSA infections. Test performance is not FDA approved in patients less than 2 years old. Performed at [facility], [address] [place], [place] [address]       Radiology Studies: No results found.   Osvaldo Shipper 01/04/2021

## 2021-01-04 NOTE — Progress Notes (Addendum)
STROKE TEAM PROGRESS NOTE   SUBJECTIVE (INTERVAL HISTORY) No family is at the bedside. Patient awake alert, moving all extremities, cranial nerve intact except that mild subjective diplopia with left gaze.  No disconjugate gaze on exam.  Patient stated that he had mild diplopia 2 days ago but that resolved yesterday and before procedure.  Post procedure he had again mild diplopia more with left gaze from a far distance.  Otherwise, patient has no complaints.  Patient reports dry eyes, artifical tears ordered. Patient does report headaches and mild leg pain that improve with tylenol. He states that the headaches happen when he wakes up and he does occasionally wake up with a gasp or snoring. Consider sleep apnea work up outpatient. Follow up with [facility] in 2-4 weeks.  Patient is hoping to to be discharged today after working with physical therapy    OBJECTIVE Temp:  [98 F (36.7 C)-99.2 F (37.3 C)] 98 F (36.7 C) (12/04 1145) Pulse Rate:  [53-75] 56 (12/04 1200) Cardiac Rhythm: Normal sinus rhythm (12/04 0800) Resp:  [0-26] 16 (12/04 1200) BP: (118-152)/(61-92) 147/80 (12/04 1200) SpO2:  [93 %-96 %] 94 % (12/04 1200) Arterial Line BP: (130-150)/(49-96) 130/96 (12/03 1530)  Recent Labs  Lab 12/31/20 0723  GLUCAP 127*    Recent Labs  Lab 12/31/20 0715 12/31/20 0730 01/01/21 0250  NA 138 141 137  K 3.9 3.9 3.9  CL 107 105 106  CO2 23  --  23  GLUCOSE 121* 119* 98  BUN 17 18 13   CREATININE 1.33* 1.30* 1.20  CALCIUM 9.2  --  9.0  MG  --   --  2.1    Recent Labs  Lab 12/31/20 0715 01/01/21 0250  AST 22 19  ALT 20 20  ALKPHOS 50 45  BILITOT 1.0 1.6*  PROT 6.6 6.2*  ALBUMIN 4.3 4.0    Recent Labs  Lab 12/31/20 0715 12/31/20 0730 01/01/21 0250  WBC 4.1  --  5.6  NEUTROABS 3.0  --   --   HGB 15.4 15.0 15.0  HCT 45.3 44.0 44.8  MCV 83.1  --  84.1  PLT 83*  --  92*    No results for input(s): CKTOTAL, CKMB, CKMBINDEX, TROPONINI in the last 168  hours. No results for input(s): LABPROT, INR in the last 72 hours.  No results for input(s): COLORURINE, LABSPEC, PHURINE, GLUCOSEU, HGBUR, BILIRUBINUR, KETONESUR, PROTEINUR, UROBILINOGEN, NITRITE, LEUKOCYTESUR in the last 72 hours.  Invalid input(s): APPERANCEUR     Component Value Date/Time   CHOL 184 01/01/2021 0250   TRIG 121 01/01/2021 0250   HDL 32 (L) 01/01/2021 0250   CHOLHDL 5.8 01/01/2021 0250   VLDL 24 01/01/2021 0250   LDLCALC 128 (H) 01/01/2021 0250   Lab Results  Component Value Date   HGBA1C 5.1 01/01/2021   No results found for: LABOPIA, COCAINSCRNUR, LABBENZ, AMPHETMU, THCU, LABBARB  No results for input(s): ETH in the last 168 hours.  I have personally reviewed the radiological images below and agree with the radiology interpretations.  CT ANGIO HEAD NECK W WO CM  Result Date: 12/31/2020 CLINICAL DATA:  Stroke/TIA.  Left-sided numbness and weakness. EXAM: CT ANGIOGRAPHY HEAD AND NECK TECHNIQUE: Multidetector CT imaging of the head and neck was performed using the standard protocol during bolus administration of intravenous contrast. Multiplanar CT image reconstructions and MIPs were obtained to evaluate the vascular anatomy. Carotid stenosis measurements (when applicable) are obtained utilizing NASCET criteria, using the distal internal carotid diameter as the denominator.  CONTRAST:  54mL OMNIPAQUE IOHEXOL 350 MG/ML SOLN COMPARISON:  CT head 12/31/2020 FINDINGS: CTA NECK FINDINGS Aortic arch: Minimal atherosclerotic disease aortic arch. Proximal great vessels widely patent. Right carotid system: Mild atherosclerotic disease right carotid bifurcation without significant stenosis. Left carotid system: Mild atherosclerotic disease left carotid bifurcation without stenosis Vertebral arteries: Right vertebral artery dominant and widely patent Small left vertebral artery is occluded in the mid neck with faint reconstitution distally with very small vessel. Skeleton: ACDF C4  through C6. No definite solid fusion at C4-5. Posterior cerclage wires at C3-4. Posterior screw and plate fusion C2 through C4. No acute skeletal abnormality. Other neck: Left lower pole thyroid nodule 2.6 cm. Right lower pole thyroid nodule 11.8 cm. Recommend thyroid ultrasound (ref: J Am Coll Radiol. 2015 Feb;12(2): 143-50). Upper chest: Lung apices clear bilaterally. Review of the MIP images confirms the above findings CTA HEAD FINDINGS Anterior circulation: Mild atherosclerotic disease in the cavernous carotid bilaterally without stenosis. Anterior and middle cerebral arteries widely patent bilaterally. 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 Right PICA is patent. Occlusion of the mid left vertebral artery with reconstitution distally. Left vertebral artery is small and diseased vessel which supplies left PICA and small contribution to the basilar. Atherosclerotic irregularity and moderate stenosis in the basilar artery. Posterior cerebral arteries patent bilaterally. Fetal origin right posterior cerebral artery. Venous sinuses: Normal venous enhancement Anatomic variants: None Review of the MIP images confirms the above findings IMPRESSION: 1. Negative for intracranial large vessel occlusion. 2. 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 3. Diffusely disease left vertebral artery which is small and occluded in the mid segment. Faint reconstitution distally. 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 Moderate stenosis basilar. 4. Bilateral thyroid nodules. Largest nodule on the left 2.6 cm. Recommend thyroid ultrasound. (Ref: J Am Coll Radiol. 2015 Feb;12(2): 143-50). Electronically Signed   By: Marlan Palau M.D.   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 EXAM: CT HEAD WITHOUT CONTRAST TECHNIQUE: Contiguous axial images were obtained from the base of the skull through the vertex without intravenous contrast. COMPARISON:  Brain MRI 12/31/2020.  CT head 12/31/2020. FINDINGS: Brain: Virtually stable gray-white matter differentiation throughout the posterior fossa. Subtle hypodensity in the left pons likely mild cytotoxic edema corresponding to the known infarct. No hemorrhage or mass effect. Stable supratentorial gray-white matter differentiation. No superimposed midline shift, ventriculomegaly, mass effect, evidence of mass lesion, intracranial hemorrhage or new cortically based acute infarction. Vascular: Calcified atherosclerosis at the skull base. New vascular stents in the right vertebral artery V4 segment and proximal through mid basilar artery. No suspicious intracranial vascular hyperdensity. Skull: No acute osseous abnormality identified. Previous anterior left maxilla ORIF. Sinuses/Orbits: Chronic left sphenoid sinusitis. Other Visualized paranasal sinuses and mastoids are clear. Other: No acute orbit or scalp soft tissue finding. IMPRESSION: 1. Expected CT appearance of small left pontine infarct. No hemorrhage or mass effect. 2. New vascular stents in the right vertebral V4 segment and Basilar Artery. No adverse features identified. 3. No new intracranial abnormality. Electronically Signed   By: Odessa Fleming M.D.   On: 01/03/2021 09:29   CT HEAD WO CONTRAST  Result Date: 12/31/2020 CLINICAL  DATA:  Neuro deficit, acute, stroke suspected EXAM: CT HEAD WITHOUT CONTRAST TECHNIQUE: Contiguous axial images were obtained from the base of the skull through the vertex without intravenous contrast. COMPARISON:  2015 FINDINGS: Brain: There is no acute intracranial hemorrhage, mass  effect, or edema. Gray-white differentiation is preserved. There is no extra-axial fluid collection. Ventricles and sulci are within normal limits in size and configuration. Vascular: There is atherosclerotic calcification at the skull base. Skull: Calvarium is unremarkable. Sinuses/Orbits: Paranasal sinus mucosal thickening. Orbits are unremarkable. Other: None. IMPRESSION: No acute intracranial hemorrhage or evidence of acute infarction. Electronically Signed   By: Guadlupe Spanish M.D.   On: 12/31/2020 08:15   MR BRAIN WO CONTRAST  Result Date: 12/31/2020 CLINICAL DATA:  Stroke/TIA.  Left-sided numbness and weakness. EXAM: MRI HEAD WITHOUT CONTRAST TECHNIQUE: Multiplanar, multiecho pulse sequences of the brain and surrounding structures were obtained without intravenous contrast. 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 Small area of acute infarct in the right posterior pons in the floor of the fourth ventricle. Ventricle size and cerebral volume normal. Negative for hemorrhage or mass. Normal white matter. Vascular: Normal arterial flow voids Skull and upper cervical spine: Negative Sinuses/Orbits: Mucosal edema paranasal sinuses.  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 Tiny acute infarct in the right posterior pons. No significant chronic ischemia Electronically Signed   By: Marlan Palau M.D.   On: 12/31/2020 19:14   ECHOCARDIOGRAM COMPLETE  Result Date: 01/01/2021    ECHOCARDIOGRAM REPORT   Patient Name:   VASILIOS OTTAWAY [facility] Date of Exam: 01/01/2021 Medical Rec #:  [id number]     Height:       70.0 in Accession #:    [id number]    Weight:       174.2 lb Date of Birth:  May 16, 1940    BSA:          1.968 m Patient Age:    80 years      BP:           125/81 mmHg Patient Gender: M             HR:           49 bpm. Exam Location:  Inpatient Procedure: 2D Echo, Cardiac Doppler and Color Doppler Indications:    Stroke  History:        Patient  has no prior history of Echocardiogram examinations.                 Risk Factors:Hypertension.  Sonographer:    Cleatis Polka Referring Phys: [id number] JUSTIN B HOWERTER IMPRESSIONS  1. Left ventricular ejection fraction, by estimation, is 60 to 65%. The left ventricle has normal function. The left ventricle has no regional wall motion abnormalities. There is mild left ventricular hypertrophy. Left ventricular diastolic parameters were normal.  2. Right ventricular systolic function is normal. The right ventricular size is normal. There is normal pulmonary artery systolic pressure.  3. The mitral valve is normal in structure. Trivial mitral valve regurgitation. No evidence of mitral stenosis.  4. The aortic valve is tricuspid. Aortic valve regurgitation is not visualized. Aortic valve sclerosis is present, with no evidence of aortic valve stenosis.  5. Aortic dilatation noted. There is mild dilatation of the ascending aorta, measuring 43 mm.  6. The inferior vena cava is dilated in size with >50% respiratory variability, suggesting right atrial pressure of 8 mmHg. Comparison(s): No prior Echocardiogram. FINDINGS  Left Ventricle: Left ventricular ejection fraction, by estimation, is 60 to 65%. The left ventricle has normal function. The left ventricle has no regional wall motion abnormalities. The left ventricular internal cavity size was normal in size. There is  mild left ventricular hypertrophy. Left ventricular diastolic parameters were normal. Right Ventricle: The right ventricular size is normal. Right ventricular systolic function is normal. There is normal pulmonary artery systolic pressure. The tricuspid regurgitant velocity is 2.50 m/s, and with an assumed right atrial pressure of 8 mmHg,  the estimated right ventricular systolic pressure is 33.0 mmHg. Left Atrium: Left atrial size was normal in size. Right Atrium: Right atrial size was normal in size. Pericardium: There is no evidence of pericardial  effusion. Mitral Valve: The mitral valve is normal in structure. Trivial mitral valve regurgitation. No evidence of mitral valve stenosis. Tricuspid Valve: The tricuspid valve is normal in structure. Tricuspid valve regurgitation is mild . No evidence of tricuspid stenosis. Aortic Valve: The aortic valve is tricuspid. Aortic valve regurgitation is not visualized. Aortic valve sclerosis is present, with no evidence of aortic valve stenosis. Aortic valve peak gradient measures 6.0 mmHg. Pulmonic Valve: The pulmonic valve was normal in structure. Pulmonic valve regurgitation is trivial. No evidence of pulmonic stenosis. Aorta: Aortic dilatation noted. There is mild dilatation of the ascending aorta, measuring 43 mm. Venous: The inferior vena cava is dilated in size with greater than 50% respiratory variability, suggesting right atrial pressure of 8 mmHg. IAS/Shunts: No atrial level shunt detected by color flow Doppler.  LEFT VENTRICLE PLAX 2D LVIDd:         4.40 cm     Diastology LVIDs:         2.60 cm     LV e' medial:    7.83 cm/s LV PW:         1.20 cm     LV E/e' medial:  9.3 LV IVS:        1.20 cm     LV e' lateral:   8.81 cm/s LVOT diam:     2.00 cm     LV E/e' lateral: 8.3 LV SV:         78 LV SV Index:   40 LVOT Area:     3.14 cm  LV Volumes (MOD) LV vol d, MOD A2C: 89.2 ml LV vol d, MOD A4C: 90.3 ml LV vol s, MOD A2C: 35.6 ml LV vol s, MOD A4C: 38.2 ml LV SV MOD A2C:     53.6 ml LV SV MOD A4C:     90.3 ml LV SV MOD BP:      52.4 ml RIGHT VENTRICLE             IVC RV Basal diam:  3.30 cm     IVC diam: 2.20 cm RV Mid diam:    2.40 cm RV S prime:     10.80 cm/s TAPSE (M-mode): 2.1 cm LEFT ATRIUM             Index        RIGHT ATRIUM           Index LA diam:        4.20 cm 2.13 cm/m   RA Area:     16.90 cm LA Vol (A2C):   42.2 ml 21.44 ml/m  RA Volume:   45.10 ml  22.91 ml/m LA Vol (A4C):   40.6 ml 20.63 ml/m LA Biplane Vol: 42.0 ml 21.34 ml/m  AORTIC VALVE AV  Area (Vmax): 2.81 cm AV Vmax:        122.00  cm/s AV Peak Grad:   6.0 mmHg LVOT Vmax:      109.00 cm/s LVOT Vmean:     76.300 cm/s LVOT VTI:       0.249 m  AORTA Ao Root diam: 3.20 cm Ao Asc diam:  3.90 cm MITRAL VALVE               TRICUSPID VALVE MV Area (PHT): 3.53 cm    TR Peak grad:   25.0 mmHg MV Decel Time: 215 msec    TR Vmax:        250.00 cm/s MV E velocity: 73.10 cm/s MV A velocity: 74.40 cm/s  SHUNTS MV E/A ratio:  0.98        Systemic VTI:  0.25 m                            Systemic Diam: 2.00 cm Olga Millers MD Electronically signed by Olga Millers MD Signature Date/Time: 01/01/2021/11:51:11 AM    Final       PHYSICAL EXAM  Temp:  [98 F (36.7 C)-99.2 F (37.3 C)] 98 F (36.7 C) (12/04 1145) Pulse Rate:  [53-75] 56 (12/04 1200) Resp:  [0-26] 16 (12/04 1200) BP: (118-152)/(61-92) 147/80 (12/04 1200) SpO2:  [93 %-96 %] 94 % (12/04 1200) Arterial Line BP: (130-150)/(49-96) 130/96 (12/03 1530)  General - Well nourished, well developed, in no apparent distress.  Ophthalmologic - fundi not visualized due to noncooperation.  Cardiovascular - Regular rhythm and rate.  Mental Status -  Level of arousal and orientation to time, place, and person were intact. Language including expression, naming, repetition, comprehension was assessed and found intact.  Cranial Nerves II - XII - II - Visual field intact OU. III, IV, VI - Extraocular movements intact.  Subjective mild diplopia on the left gaze with far vision but normal at arms length. V - Facial sensation intact bilaterally. VII - Facial movement intact bilaterally. VIII - Hearing & vestibular intact bilaterally. X - Palate elevates symmetrically. XI - Chin turning & shoulder shrug intact bilaterally. XII - Tongue protrusion intact.  Motor Strength - The patient's strength was normal in all extremities and pronator drift was absent.  Bulk was normal and fasciculations were absent.   Motor Tone - Muscle tone was assessed at the neck and appendages and was  normal.  Reflexes - The patient's reflexes were symmetrical in all extremities and he had no pathological reflexes.  Sensory - Light touch, temperature/pinprick were assessed and were symmetrical.    Coordination - The patient had normal movements in the hands and left foot with no ataxia or dysmetria.  Normal FFMs.Tremor was absent.  Gait and Station - deferred.   ASSESSMENT/PLAN [name] is a 80 y.o. male with history of hypertension, hyperlipidemia, depression admitted for mild double vision, imbalance, left-sided numbness and dysarthria. No tPA given due to outside window.  Patient returned to the Neuro ICU after successful angioplasty/stenting of the basilar and intracranial right vertebral artery. BP managed well post procedure. Suspicion of sleep apnea.   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 CT no acute finding CT- 12/31. Expected CT appearance of small left pontine infarct. No hemorrhage or mass effect. New vascular stents in the right vertebral V4 segment and Basilar Artery. No adverse features  identified. CT head and neck mid basilar arteries severe stenosis, right VA tandem stenosis, L VA occlusion MRI left mid pontine infarct, right ventral pontine punctate infarct. 2D Echo EF 60 to 65% LDL 128 HgbA1c 5.1 SCDs for VTE prophylaxis No antithrombotic prior to admission, now on aspirin 81 mg daily and Brilinta (ticagrelor) 90 mg bid after loading. Patient counseled to be compliant with his antithrombotic medications Ongoing aggressive stroke risk factor management Therapy recommendations: Outpatient PT Disposition: Pending  Posterior circulation severe stenosis CT head and neck mid basilar arteries severe stenosis, right VA tandem stenosis, L  VA occlusion Likely the cause of current stroke S/p angiogram and R VA and BA stenting 01/02/2021 with Dr. Sherlon Handing  On aspirin and  Brilinta   Hypertension Stable Avoid low BP BP goal 120-140 24h post intervention Long term BP goal normotensive after   Hyperlipidemia Home meds: Pravastatin 40 LDL 128, goal < 70 Now on Lipitor 80 Continue statin at discharge  Other Stroke Risk Factors Advanced age ?Obstructive Sleep apnea Headaches after awakening Patient reports occasional snoring and gasping awake  Outpatient sleep study  Other Active Problems Depression on Zoloft BPH- continue home medications GERD- Continue home medications   Hospital day # 2  Patient seen and examined by NP/APP with MD. MD to update note as needed.   Elmer Picker, DNP, FNP-BC [facility] Pager: [phone]  ATTENDING ATTESTATION:  This is a patient with pontine infarcts and right vertebral artery stenosis status post BA and right VA stenting.  He appears to be doing well since the procedure.  He still has subjective diplopia and is far away visual field but not when he is reading or using his phone at arms length.  He did not get his eyedrops yesterday as ordered he should get this today.  His leg symptoms has resolved.  He is okay to discharge home with close follow-up in the stroke clinic in 3 weeks.   Dr. Viviann Spare evaluated pt independently, reviewed imaging, chart, labs. Discussed and formulated plan with the APP. Please see APP note above for details.     I spent 35 minutes of  time in the care of this patient in total, including reviewing the chart, examination, discussing with APP and nursing staff.  Deundra Bard,MD    To contact Stroke Continuity provider, please refer to [email or web address]. After hours, contact General Neurology

## 2021-01-04 NOTE — Progress Notes (Signed)
Brilinta voucher tubed to nurses station on 4N for bedside RN to provide to pt on discharge.

## 2021-01-04 NOTE — Progress Notes (Signed)
Physical Therapy Treatment Patient Details Name: Kevin Santiago MRN: [id number] DOB: 07-02-1940 Today's Date: 01/04/2021   History of Present Illness Pt is a 80 y/o male admitted secondary to L sided numbness and speech deficits. 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 s/p R basilar artery stent on 12/2. PMH includes HTN.    PT Comments    Pt progressing well towards his physical therapy goals. Ambulating 250 feet with no assistive device at a supervision level, with varying gait speed. Negotiated a half flight of stairs with and without rails at a min guard assist. Discussed recommendation for supervision/guarding when ambulating over unlevel surfaces or climbing stairs. D/c plan remains appropriate.     Recommendations for follow up therapy are one component of a multi-disciplinary discharge planning process, led by the attending physician.  Recommendations may be updated based on patient status, additional functional criteria and insurance authorization.  Follow Up Recommendations  Outpatient PT (neuro)     Assistance Recommended at Discharge Intermittent Supervision/Assistance  Equipment Recommendations  Rolling walker (2 wheels)    Recommendations for Other Services       Precautions / Restrictions Precautions Precautions: Fall Restrictions Weight Bearing Restrictions: No     Mobility  Bed Mobility Overal bed mobility: Modified Independent             General bed mobility comments: OOB in chair    Transfers Overall transfer level: Independent Equipment used: None   Sit to Stand: Modified independent (Device/Increase time)                Ambulation/Gait Ambulation/Gait assistance: Supervision Gait Distance (Feet): 250 Feet Assistive device: None Gait Pattern/deviations: Step-through pattern;Decreased dorsiflexion - right;Decreased dorsiflexion - left       General Gait Details: Noted fairly flat feet bilaterally with decreased heel  strike at initial contact (likely due to tight gastrocs). Overall steady over level surfaces, even with varying gait speeds   Stairs Stairs: Yes Stairs assistance: Min guard Stair Management: One rail Left;No rails Number of Stairs: 12 General stair comments: cues for step by step pattern, slowing down, making sure foot is fully on step. Pt negotiated 10 steps with railing, then an additional 2 steps with no railing and HHA   Wheelchair Mobility    Modified Rankin (Stroke Patients Only) Modified Rankin (Stroke Patients Only) Pre-Morbid Rankin Score: No symptoms Modified Rankin: Moderately severe disability     Balance Overall balance assessment: Needs assistance Sitting-balance support: No upper extremity supported;Feet supported Sitting balance-Leahy Scale: Good     Standing balance support: No upper extremity supported;During functional activity Standing balance-Leahy Scale: Good                              Cognition Arousal/Alertness: Awake/alert Behavior During Therapy: WFL for tasks assessed/performed Overall Cognitive Status: Within Functional Limits for tasks assessed                                          Exercises Other Exercises Other Exercises: Standing: bilateral runner's calf stretch x 1 minute each (x2 reps)    General Comments        Pertinent Vitals/Pain Pain Assessment: No/denies pain    Home Living  Prior Function            PT Goals (current goals can now be found in the care plan section) Acute Rehab PT Goals Patient Stated Goal: to go home PT Goal Formulation: With patient Time For Goal Achievement: 01/15/21 Potential to Achieve Goals: Good Progress towards PT goals: Progressing toward goals    Frequency    Min 4X/week      PT Plan Current plan remains appropriate    Co-evaluation              AM-PAC PT "6 Clicks" Mobility   Outcome Measure  Help  needed turning from your back to your side while in a flat bed without using bedrails?: None Help needed moving from lying on your back to sitting on the side of a flat bed without using bedrails?: None Help needed moving to and from a bed to a chair (including a wheelchair)?: None Help needed standing up from a chair using your arms (e.g., wheelchair or bedside chair)?: None Help needed to walk in hospital room?: A Little Help needed climbing 3-5 steps with a railing? : A Little 6 Click Score: 22    End of Session Equipment Utilized During Treatment: Gait belt Activity Tolerance: Patient tolerated treatment well Patient left: in chair;with call bell/phone within reach;with chair alarm set Nurse Communication: Mobility status PT Visit Diagnosis: Unsteadiness on feet (R26.81);Ataxic gait (R26.0);Other symptoms and signs involving the nervous system (R29.898)     Time: 4008-6761 PT Time Calculation (min) (ACUTE ONLY): 21 min  Charges:  $Therapeutic Activity: 8-22 mins                     Lillia Pauls, PT, DPT Acute Rehabilitation Services Pager [phone] Office [phone]    Norval Morton 01/04/2021, 3:36 PM

## 2021-01-05 ENCOUNTER — Other Ambulatory Visit: Payer: Self-pay | Admitting: Physician Assistant

## 2021-01-05 ENCOUNTER — Encounter (HOSPITAL_COMMUNITY): Payer: Self-pay | Admitting: Neuroradiology

## 2021-01-05 DIAGNOSIS — I639 Cerebral infarction, unspecified: Secondary | ICD-10-CM

## 2021-01-05 NOTE — Discharge Summary (Signed)
[facility]  Physician Discharge Summary   Patient ID: JAMIAH RECORE MRN: [id number] DOB/AGE: 02-05-1940 80 y.o.  Admit date: 12/31/2020 Discharge date: 01/04/2021    PCP: Patient, No Pcp Per (Inactive)  DISCHARGE DIAGNOSES:  Acute stroke Essential hypertension   RECOMMENDATIONS FOR OUTPATIENT FOLLOW UP: Ambulatory referral sent for outpatient PT and OT    Home Health: Outpatient PT Equipment/Devices: None  CODE STATUS: Full code  DISCHARGE CONDITION: fair  Diet recommendation: Heart healthy  INITIAL HISTORY: HAYDON DORRIS is a 80 y.o. male with medical history significant for hypertension, hyperlipidemia who is admitted to [facility] on 12/31/2020 with acute ischemic strokeafter presenting from home to [facility] ED complaining of  left-sided numbness.  She was found to have a pons infarct.  Neuro IR also consulted and he is status post intracranial angioplasty/stenting of the basilar and intracranial right vertebral artery.  Monitored in the ICU postprocedure    Consultations: Neurology  Procedures: Transthoracic echocardiogram   HOSPITAL COURSE:   Acute CVA He was admitted to the hospital with intermittent right-sided blurry vision/dysarthria as well as poor balance and left-sided numbness.  MRI of the brain showed an acute infarct in the left pons as well as a tiny acute infarct in the right posterior pons.  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   Neurology is recommending aspirin and Brilinta. 2D echo showed LVEF 60-65%, no WMA, normal diastolic parameters.   Lipid panel showed an LDL of 128, he has placed on atorvastatin 80.   Hemoglobin A1c was  5.1.   Therapies recommended outpatient PT/OT which is being arranged by case management.   Neuro IR consulted and he is status post intracranial angioplasty/stenting of the basilar and intracranial right vertebral artery.     Essential hypertension Generalized anxiety disorder BPH GERD  Patient is stable.  Cleared by neurology for discharge.  Okay for discharge home today.   PERTINENT LABS:  The results of significant diagnostics from this hospitalization (including imaging, microbiology, ancillary and laboratory) are listed below for reference.    Microbiology: Recent Results (from the past 240 hour(s))  Resp Panel by RT-PCR (Flu A&B, Covid) Nasopharyngeal Swab     Status: None   Collection Time: 01/01/21  9:23 AM   Specimen: Nasopharyngeal Swab; Nasopharyngeal(NP) swabs in vial transport medium  Result Value Ref Range Status   SARS Coronavirus 2 by RT PCR NEGATIVE NEGATIVE Final    Comment: (NOTE) SARS-CoV-2 target nucleic acids are NOT DETECTED.  The SARS-CoV-2 RNA is generally detectable in upper respiratory specimens during the acute phase of infection. The lowest concentration of SARS-CoV-2 viral copies this assay can detect is 138 copies/mL. A negative result does not preclude SARS-Cov-2 infection and should not be used as the sole basis for treatment or other patient management decisions. A negative result may occur with  improper specimen collection/handling, submission of specimen other than nasopharyngeal swab, presence of viral mutation(s) within the areas targeted by this assay, and inadequate number of viral copies(<138 copies/mL). A negative result must be combined with clinical observations, patient history, and epidemiological information. The expected result is Negative.  Fact Sheet for Patients:  BloggerCourse.com  Fact Sheet for Healthcare Providers:  SeriousBroker.it  This test is no t yet  approved or  cleared by the [place] and  has been authorized for detection and/or diagnosis of SARS-CoV-2 by FDA under an Emergency Use Authorization (EUA). This EUA will remain  in effect (meaning this test can be used) for the duration of the COVID-19 declaration under Section 564(b)(1) of the Act, 21 U.S.C.section 360bbb-3(b)(1), unless the authorization is terminated  or revoked sooner.       Influenza A by PCR NEGATIVE NEGATIVE Final   Influenza B by PCR NEGATIVE NEGATIVE Final    Comment: (NOTE) The Xpert Xpress SARS-CoV-2/FLU/RSV plus assay is intended as an aid in the diagnosis of influenza from Nasopharyngeal swab specimens and should not be used as a sole basis for treatment. Nasal washings and aspirates are unacceptable for Xpert Xpress SARS-CoV-2/FLU/RSV testing.  Fact Sheet for Patients: BloggerCourse.com  Fact Sheet for Healthcare Providers: SeriousBroker.it  This test is not yet approved or cleared by the [place] FDA and has been authorized for detection and/or diagnosis of SARS-CoV-2 by FDA under an Emergency Use Authorization (EUA). This EUA will remain in effect (meaning this test can be used) for the duration of the COVID-19 declaration under Section 564(b)(1) of the Act, 21 U.S.C. section 360bbb-3(b)(1), unless the authorization is terminated or revoked.  Performed at [facility], [address], [place], [place] [address]   MRSA Next Gen by PCR, Nasal     Status: None   Collection Time: 01/02/21  8:34 PM   Specimen: Nasal Mucosa; Nasal Swab  Result Value Ref Range Status   MRSA by PCR Next Gen NOT DETECTED NOT DETECTED Final    Comment: (NOTE) The GeneXpert MRSA Assay (FDA approved for NASAL specimens only), is one component of a comprehensive MRSA colonization surveillance program. It is not intended to diagnose MRSA infection nor to guide or monitor treatment for MRSA infections. Test  performance is not FDA approved in patients less than 32 years old. Performed at [facility], [address], [place], [place] [address]      Labs:  COVID-19 Labs   Lab Results  Component Value Date   SARSCOV2NAA NEGATIVE 01/01/2021      Basic Metabolic Panel: Recent Labs  Lab 12/31/20 0715 12/31/20 0730 01/01/21 0250  NA 138 141 137  K 3.9 3.9 3.9  CL 107 105 106  CO2 23  --  23  GLUCOSE 121* 119* 98  BUN CREATININE 1.33* 1.30* 1.20  CALCIUM 9.2  --  9.0  MG  --   --  2.1   Liver Function Tests: Recent Labs  Lab 12/31/20 0715 01/01/21 0250  AST 22 19  ALT 20 20  ALKPHOS 50 45  BILITOT 1.0 1.6*  PROT 6.6 6.2*  ALBUMIN 4.3 4.0    CBC: Recent Labs  Lab 12/31/20 0715 12/31/20 0730 01/01/21 0250  WBC 4.1  --  5.6  NEUTROABS 3.0  --   --   HGB 15.4 15.0 15.0  HCT 45.3 44.0 44.8  MCV 83.1  --  84.1  PLT 83*  --  92*    CBG: Recent Labs  Lab 12/31/20 0723  GLUCAP 127*     IMAGING STUDIES CT ANGIO HEAD NECK W WO CM  Result Date: 12/31/2020 CLINICAL DATA:  Stroke/TIA.  Left-sided numbness and weakness. EXAM: CT ANGIOGRAPHY HEAD AND NECK TECHNIQUE: Multidetector CT imaging of the head and neck was performed using the standard protocol during bolus administration of intravenous contrast. Multiplanar CT image reconstructions  and MIPs were obtained to evaluate the vascular anatomy. Carotid stenosis measurements (when applicable) are obtained utilizing NASCET criteria, using the distal internal carotid diameter as the denominator. CONTRAST:  59mL OMNIPAQUE IOHEXOL 350 MG/ML SOLN COMPARISON:  CT head 12/31/2020 FINDINGS: CTA NECK FINDINGS Aortic arch: Minimal atherosclerotic disease aortic arch. Proximal great vessels widely patent. Right carotid system: Mild atherosclerotic disease right carotid bifurcation without significant stenosis. Left carotid system: Mild atherosclerotic disease left carotid bifurcation without stenosis Vertebral  arteries: Right vertebral artery dominant and widely patent Small left vertebral artery is occluded in the mid neck with faint reconstitution distally with very small vessel. Skeleton: ACDF C4 through C6. No definite solid fusion at C4-5. Posterior cerclage wires at C3-4. Posterior screw and plate fusion C2 through C4. No acute skeletal abnormality. Other neck: Left lower pole thyroid nodule 2.6 cm. Right lower pole thyroid nodule 11.8 cm. Recommend thyroid ultrasound (ref: J Am Coll Radiol. 2015 Feb;12(2): 143-50). Upper chest: Lung apices clear bilaterally. Review of the MIP images confirms the above findings CTA HEAD FINDINGS Anterior circulation: Mild atherosclerotic disease in the cavernous carotid bilaterally without stenosis. Anterior and middle cerebral arteries widely patent bilaterally. 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 Right PICA is patent. Occlusion of the mid left vertebral artery with reconstitution distally. Left vertebral artery is small and diseased vessel which supplies left PICA and small contribution to the basilar. Atherosclerotic irregularity and moderate stenosis in the basilar artery. Posterior cerebral arteries patent bilaterally. Fetal origin right posterior cerebral artery. Venous sinuses: Normal venous enhancement Anatomic variants: None Review of the MIP images confirms the above findings IMPRESSION: 1. Negative for intracranial large vessel occlusion. 2. 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 3. Diffusely disease left vertebral artery which is small and occluded in the mid segment. Faint reconstitution distally. 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 Moderate stenosis basilar. 4. Bilateral thyroid nodules. Largest nodule on the left 2.6 cm. Recommend thyroid ultrasound. (Ref: J Am Coll Radiol. 2015  Feb;12(2): 143-50). Electronically Signed   By: Marlan Palau M.D.   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 EXAM: CT HEAD WITHOUT CONTRAST TECHNIQUE: Contiguous axial images were obtained from the base of the skull through the vertex without intravenous contrast. COMPARISON:  Brain MRI 12/31/2020.  CT head 12/31/2020. FINDINGS: Brain: Virtually stable gray-white matter differentiation throughout the posterior fossa. Subtle hypodensity in the left pons likely mild cytotoxic edema corresponding to the known infarct. No hemorrhage or mass effect. Stable supratentorial gray-white matter differentiation. No superimposed midline shift, ventriculomegaly, mass effect, evidence of mass lesion, intracranial hemorrhage or new cortically based acute infarction. Vascular: Calcified atherosclerosis at the skull base. New vascular stents in the right vertebral artery V4 segment and proximal through mid basilar artery. No suspicious intracranial vascular hyperdensity. Skull: No acute osseous abnormality identified. Previous anterior left maxilla ORIF. Sinuses/Orbits: Chronic left sphenoid sinusitis. Other Visualized paranasal sinuses and mastoids are clear. Other: No acute orbit or scalp soft tissue finding. IMPRESSION: 1. Expected CT appearance of small left pontine infarct. No hemorrhage or mass effect. 2. New vascular stents in the right vertebral V4 segment and Basilar Artery. No adverse features identified. 3. No  new intracranial abnormality. Electronically Signed   By: Odessa Fleming M.D.   On: 01/03/2021 09:29   CT HEAD WO CONTRAST  Result Date: 12/31/2020 CLINICAL DATA:  Neuro deficit, acute, stroke suspected EXAM: CT HEAD WITHOUT CONTRAST TECHNIQUE:  Contiguous axial images were obtained from the base of the skull through the vertex without intravenous contrast. COMPARISON:  2015 FINDINGS: Brain: There is no acute intracranial hemorrhage, mass effect, or edema. Gray-white differentiation is preserved. There is no extra-axial fluid collection. Ventricles and sulci are within normal limits in size and configuration. Vascular: There is atherosclerotic calcification at the skull base. Skull: Calvarium is unremarkable. Sinuses/Orbits: Paranasal sinus mucosal thickening. Orbits are unremarkable. Other: None. IMPRESSION: No acute intracranial hemorrhage or evidence of acute infarction. Electronically Signed   By: Guadlupe Spanish M.D.   On: 12/31/2020 08:15   MR BRAIN WO CONTRAST  Result Date: 12/31/2020 CLINICAL DATA:  Stroke/TIA.  Left-sided numbness and weakness. EXAM: MRI HEAD WITHOUT CONTRAST TECHNIQUE: Multiplanar, multiecho pulse sequences of the brain and surrounding structures were obtained without intravenous contrast. 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 Small area of acute infarct in the right posterior pons in the floor of the fourth ventricle. Ventricle size and cerebral volume normal. Negative for hemorrhage or mass. Normal white matter. Vascular: Normal arterial flow voids Skull and upper cervical spine: Negative Sinuses/Orbits: Mucosal edema paranasal sinuses.  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 Tiny acute infarct in the right posterior pons. No significant chronic ischemia Electronically Signed   By: Marlan Palau M.D.   On: 12/31/2020 19:14   ECHOCARDIOGRAM COMPLETE  Result Date: 01/01/2021    ECHOCARDIOGRAM REPORT   Patient Name:   YUL DIANA [facility] Date of Exam: 01/01/2021 Medical Rec #:  [id number]     Height:       70.0 in Accession #:    [id number]    Weight:       174.2 lb Date of Birth:  [birth year]    BSA:          1.968 m Patient Age:    80 years      BP:            125/81 mmHg Patient Gender: M             HR:           49 bpm. Exam Location:  Inpatient Procedure: 2D Echo, Cardiac Doppler and Color Doppler Indications:    Stroke  History:        Patient has no prior history of Echocardiogram examinations.                 Risk Factors:Hypertension.  Sonographer:    Cleatis Polka Referring Phys: [id number] JUSTIN B HOWERTER IMPRESSIONS  1. Left ventricular ejection fraction, by estimation, is 60 to 65%. The left ventricle has normal function. The left ventricle has no regional wall motion abnormalities. There is mild left ventricular hypertrophy. Left ventricular diastolic parameters were normal.  2. Right ventricular systolic function is normal. The right ventricular size is normal. There is normal pulmonary artery systolic pressure.  3. The mitral valve is normal in structure. Trivial mitral valve regurgitation. No evidence of mitral stenosis.  4. The aortic valve is tricuspid. Aortic valve regurgitation is not visualized. Aortic valve sclerosis is present, with no evidence of aortic valve stenosis.  5. Aortic dilatation noted. There is mild dilatation of the ascending aorta, measuring 43  mm.  6. The inferior vena cava is dilated in size with >50% respiratory variability, suggesting right atrial pressure of 8 mmHg. Comparison(s): No prior Echocardiogram. FINDINGS  Left Ventricle: Left ventricular ejection fraction, by estimation, is 60 to 65%. The left ventricle has normal function. The left ventricle has no regional wall motion abnormalities. The left ventricular internal cavity size was normal in size. There is  mild left ventricular hypertrophy. Left ventricular diastolic parameters were normal. Right Ventricle: The right ventricular size is normal. Right ventricular systolic function is normal. There is normal pulmonary artery systolic pressure. The tricuspid regurgitant velocity is 2.50 m/s, and with an assumed right atrial pressure of 8 mmHg,  the estimated right  ventricular systolic pressure is 33.0 mmHg. Left Atrium: Left atrial size was normal in size. Right Atrium: Right atrial size was normal in size. Pericardium: There is no evidence of pericardial effusion. Mitral Valve: The mitral valve is normal in structure. Trivial mitral valve regurgitation. No evidence of mitral valve stenosis. Tricuspid Valve: The tricuspid valve is normal in structure. Tricuspid valve regurgitation is mild . No evidence of tricuspid stenosis. Aortic Valve: The aortic valve is tricuspid. Aortic valve regurgitation is not visualized. Aortic valve sclerosis is present, with no evidence of aortic valve stenosis. Aortic valve peak gradient measures 6.0 mmHg. Pulmonic Valve: The pulmonic valve was normal in structure. Pulmonic valve regurgitation is trivial. No evidence of pulmonic stenosis. Aorta: Aortic dilatation noted. There is mild dilatation of the ascending aorta, measuring 43 mm. Venous: The inferior vena cava is dilated in size with greater than 50% respiratory variability, suggesting right atrial pressure of 8 mmHg. IAS/Shunts: No atrial level shunt detected by color flow Doppler.  LEFT VENTRICLE PLAX 2D LVIDd:         4.40 cm     Diastology LVIDs:         2.60 cm     LV e' medial:    7.83 cm/s LV PW:         1.20 cm     LV E/e' medial:  9.3 LV IVS:        1.20 cm     LV e' lateral:   8.81 cm/s LVOT diam:     2.00 cm     LV E/e' lateral: 8.3 LV SV:         78 LV SV Index:   40 LVOT Area:     3.14 cm  LV Volumes (MOD) LV vol d, MOD A2C: 89.2 ml LV vol d, MOD A4C: 90.3 ml LV vol s, MOD A2C: 35.6 ml LV vol s, MOD A4C: 38.2 ml LV SV MOD A2C:     53.6 ml LV SV MOD A4C:     90.3 ml LV SV MOD BP:      52.4 ml RIGHT VENTRICLE             IVC RV Basal diam:  3.30 cm     IVC diam: 2.20 cm RV Mid diam:    2.40 cm RV S prime:     10.80 cm/s TAPSE (M-mode): 2.1 cm LEFT ATRIUM             Index        RIGHT ATRIUM           Index LA diam:        4.20 cm 2.13 cm/m   RA Area:     16.90 cm LA Vol (A2C):    42.2 ml 21.44 ml/m  RA Volume:   45.10 ml  22.91 ml/m LA Vol (A4C):   40.6 ml 20.63 ml/m LA Biplane Vol: 42.0 ml 21.34 ml/m  AORTIC VALVE AV Area (Vmax): 2.81 cm AV Vmax:        122.00 cm/s AV Peak Grad:   6.0 mmHg LVOT Vmax:      109.00 cm/s LVOT Vmean:     76.300 cm/s LVOT VTI:       0.249 m  AORTA Ao Root diam: 3.20 cm Ao Asc diam:  3.90 cm MITRAL VALVE               TRICUSPID VALVE MV Area (PHT): 3.53 cm    TR Peak grad:   25.0 mmHg MV Decel Time: 215 msec    TR Vmax:        250.00 cm/s MV E velocity: 73.10 cm/s MV A velocity: 74.40 cm/s  SHUNTS MV E/A ratio:  0.98        Systemic VTI:  0.25 m                            Systemic Diam: 2.00 cm Olga Millers MD Electronically signed by Olga Millers MD Signature Date/Time: 01/01/2021/11:51:11 AM    Final     DISCHARGE EXAMINATION: Vitals:   01/04/21 1100 01/04/21 1145 01/04/21 1200 01/04/21 1300  BP: (!) 146/75  (!) 147/80   Pulse: (!) 55  (!) 56 61  Resp: (!) Temp:  98 F (36.7 C)    TempSrc:  Oral    SpO2: 93%  94% 95%  Weight:      Height:       General appearance: Awake alert.  In no distress Resp: Clear to auscultation bilaterally.  Normal effort Cardio: S1-S2 is normal regular.  No S3-S4.  No rubs murmurs or bruit GI: Abdomen is soft.  Nontender nondistended.  Bowel sounds are present normal.  No masses organomegaly    DISPOSITION: Home  Discharge Instructions     Ambulatory referral to Neurology   Complete by: As directed    An appointment is requested in approximately: 4 weeks for acute stroke.   Ambulatory referral to Physical Therapy   Complete by: As directed    Call MD for:  difficulty breathing, headache or visual disturbances   Complete by: As directed    Call MD for:  extreme fatigue   Complete by: As directed    Call MD for:  hives   Complete by: As directed    Call MD for:  persistant dizziness or light-headedness   Complete by: As directed    Call MD for:  persistant nausea and vomiting    Complete by: As directed    Call MD for:  severe uncontrolled pain   Complete by: As directed    Call MD for:  temperature >100.4   Complete by: As directed    Diet - low sodium heart healthy   Complete by: As directed    Discharge instructions   Complete by: As directed    Please take your medications as prescribed. Referral has been sent for outpatient physical therapy and for follow up with neurology.  You were cared for by a hospitalist during your hospital stay. If you have any questions about your discharge medications or the care you received while you were in the hospital after you are discharged, you can call the unit and asked to speak with  the hospitalist on call if the hospitalist that took care of you is not available. Once you are discharged, your primary care physician will handle any further medical issues. Please note that NO REFILLS for any discharge medications will be authorized once you are discharged, as it is imperative that you return to your primary care physician (or establish a relationship with a primary care physician if you do not have one) for your aftercare needs so that they can reassess your need for medications and monitor your lab values. If you do not have a primary care physician, you can call [phone] for a physician referral.   Increase activity slowly   Complete by: As directed          Allergies as of 01/04/2021       Reactions   Other Rash   Fish sticks only. Does fine with all other fish products.    Penicillins Itching, Rash        Medication List     STOP taking these medications    pravastatin 40 MG tablet Commonly known as: PRAVACHOL       TAKE these medications    aspirin 81 MG chewable tablet Chew 1 tablet (81 mg total) by mouth daily.   atorvastatin 80 MG tablet Commonly known as: LIPITOR Take 1 tablet (80 mg total) by mouth daily.   cholecalciferol 1000 units tablet Commonly known as: VITAMIN D Take 2,000 Units by  mouth daily.   ferrous sulfate 325 (65 FE) MG tablet Take 325 mg by mouth every other day.   fluorouracil 5 % cream Commonly known as: EFUDEX Apply 1 application topically daily as needed. Skin basil cell areas as directed   hydrocortisone 2.5 % rectal cream Commonly known as: Anusol-HC Place 1 application rectally 2 (two) times daily. What changed:  when to take this reasons to take this   hydroxypropyl methylcellulose / hypromellose 2.5 % ophthalmic solution Commonly known as: ISOPTO TEARS / GONIOVISC Place 2 drops into both eyes 2 (two) times daily as needed for dry eyes.   losartan 50 MG tablet Commonly known as: COZAAR Take 25 mg by mouth daily.   omeprazole 40 MG capsule Commonly known as: PRILOSEC Take 40 mg by mouth daily as needed (ACID REFLUX).   ondansetron 4 MG disintegrating tablet Commonly known as: Zofran ODT Take 1 tablet (4 mg total) by mouth every 6 (six) hours as needed for nausea or vomiting.   sertraline 25 MG tablet Commonly known as: ZOLOFT Take 25 mg by mouth at bedtime.   tamsulosin 0.4 MG Caps capsule Commonly known as: FLOMAX Take 0.4 mg by mouth at bedtime.   ticagrelor 90 MG Tabs tablet Commonly known as: BRILINTA Take 1 tablet (90 mg total) by mouth 2 (two) times daily.          Follow-up Information     [facility]. Schedule an appointment as soon as possible for a visit in 1 week(s).   Specialty: Rehabilitation Contact information: [address] [id number] [place] [address] [phone]                TOTAL DISCHARGE TIME: 35 minutes  Aretha Levi Rito Ehrlich  [facility] Pager on [email or web address]  01/05/2021, 1:01 PM

## 2021-01-05 NOTE — Anesthesia Postprocedure Evaluation (Signed)
Anesthesia Post Note  Patient: Kevin Santiago  Procedure(s) Performed: IR WITH ANESTHESIA     Patient location during evaluation: PACU Anesthesia Type: General Level of consciousness: awake and alert Pain management: pain level controlled Vital Signs Assessment: post-procedure vital signs reviewed and stable Respiratory status: spontaneous breathing, nonlabored ventilation and respiratory function stable Cardiovascular status: stable and blood pressure returned to baseline Anesthetic complications: no   No notable events documented.  Last Vitals:  Vitals:   01/04/21 1200 01/04/21 1300  BP: (!) 147/80   Pulse: (!) 56 61  Resp: 16 18  Temp:    SpO2: 94% 95%    Last Pain:  Vitals:   01/04/21 1200  TempSrc:   PainSc: 0-No pain                 Beryle Lathe

## 2021-01-08 ENCOUNTER — Other Ambulatory Visit: Payer: Self-pay

## 2021-01-08 ENCOUNTER — Ambulatory Visit: Payer: Medicare Other | Attending: Internal Medicine

## 2021-01-08 VITALS — BP 144/78 | HR 58

## 2021-01-08 DIAGNOSIS — R262 Difficulty in walking, not elsewhere classified: Secondary | ICD-10-CM | POA: Diagnosis present

## 2021-01-08 DIAGNOSIS — R2681 Unsteadiness on feet: Secondary | ICD-10-CM | POA: Insufficient documentation

## 2021-01-08 DIAGNOSIS — R278 Other lack of coordination: Secondary | ICD-10-CM | POA: Insufficient documentation

## 2021-01-08 DIAGNOSIS — R269 Unspecified abnormalities of gait and mobility: Secondary | ICD-10-CM | POA: Diagnosis present

## 2021-01-08 DIAGNOSIS — M6281 Muscle weakness (generalized): Secondary | ICD-10-CM | POA: Diagnosis present

## 2021-01-08 DIAGNOSIS — I639 Cerebral infarction, unspecified: Secondary | ICD-10-CM | POA: Diagnosis not present

## 2021-01-08 NOTE — Therapy (Deleted)
[facility] MAIN [facility] SERVICES [address] [place], [place], [address] Phone: [phone]   Fax:  [phone]  Physical Therapy Treatment  Patient Details  Name: Kevin Santiago MRN: [id number] Date of Birth: 28-Jun-1940 Referring Provider (PT): Dr. Pamella Pert, MD  Encounter Date: 01/08/2021   PT End of Session - 01/08/21 1217     Visit Number 1    Number of Visits 17    Date for PT Re-Evaluation 03/05/21    Authorization Time Period Eval: 01/08/21    Progress Note Due on Visit 10    PT Start Time 0930    PT Stop Time 1031    PT Time Calculation (min) 61 min    Equipment Utilized During Treatment Gait belt    Activity Tolerance Patient tolerated treatment well;No increased pain    Behavior During Therapy WFL for tasks assessed/performed             Past Medical History:  Diagnosis Date   Anxiety    Depression    Hypercholesteremia    Hypertension     Past Surgical History:  Procedure Laterality Date   cervical fusion     IR ANGIO INTRA EXTRACRAN SEL INTERNAL CAROTID BILAT MOD SED  01/02/2021   IR ANGIO VERTEBRAL SEL SUBCLAVIAN INNOMINATE UNI L MOD SED  01/02/2021   IR ANGIO VERTEBRAL SEL VERTEBRAL UNI R MOD SED  01/02/2021   IR CT HEAD LTD  01/02/2021   IR INTRA CRAN STENT  01/02/2021   IR NEURO EACH ADD'L AFTER BASIC UNI RIGHT (MS)  01/02/2021   RADIOLOGY WITH ANESTHESIA N/A 01/02/2021   Procedure: IR WITH ANESTHESIA;  Surgeon: Baldemar Lenis, MD;  Location: [facility] OR;  Service: Radiology;  Laterality: N/A;   SHOULDER SURGERY Left 1990   WRIST FRACTURE SURGERY      Vitals:   01/08/21 1648  BP: (!) 144/78  Pulse: (!) 58     Subjective Assessment - 01/08/21 0927     Subjective Pt is a 80 y/o male presenting to PT today with balance and visual deficits following 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 Pt states his friend reported his speech began to slur recommended he eat a cookie and pepsi thinking his blood sugar was low. Pt felt  better ~15 minutes after eating. Pt went to bed that night and woke up at 4 AM on 12/31/20 and felt like "everything went limp" stating he felt some tingling in the LLE and couldn't hold up his L arm. By the time the paramedics arrived he was able to walk to the ambulance. Pt reports he had returned tingling in the LLE in the waiting room. Pt notes he currently has more tingling in the R leg. Pt reports he is experiencing double vision that comes on in waves and "seems to come on when he really focuses on things". 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 He reports his balance as decreased over the past few years even before his stroke. Pt denies any dizziness other than an episode of the room spinning when he woke up about a month ago.  Pt has a cane at home but he doesn't use it and reports he doesn't walk much. Pt reports having intermittent headaches since the stroke and has been taking tylenol PRN for relief. Reports dull achy pain in the back of his neck rated 4/10. Pt reports no recent falls.    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 PMH includes HTN, hypercholestermia, depression, and anxiety. Pt presents to evaluation with balance and visual deficits, mild strength deficits, headaches, and posterior neck pain.    Limitations Standing;Walking;Lifting;House hold activities    How long can you sit comfortably? No limitations    How long can you stand comfortably? about an hour    How long can you walk comfortably? 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 Yes    Pain Score 4     Pain Location Neck    Pain Orientation Posterior;Mid    Pain Descriptors / Indicators Aching;Dull    Pain Type Acute pain              BP: 144/78, SPO2 99, HR 58  OBJECTIVE  Musculoskeletal Tremor: Absent Bulk: Normal Tone: Normal, no clonus   Posture No gross abnormalities noted in standing or seated posture   Gait No gross abnormalities in gait noted   Strength R/L 5/5 Hip flexion 5/5 Hip abduction 5/5 Hip adduction 5/5 Knee extension 5/5 Knee flexion 5/4+ Ankle Plantarflexion 5/4+ Ankle Dorsiflexion   NEUROLOGICAL:  Mental Status Recent memory is intact.  Remote memory is intact.  Attention span and concentration are intact.  Expressive speech is intact.  Patient's fund of knowledge is within normal limits for educational level.   Sensation Grossly intact to light touch bilateral LEs as determined by testing dermatomes L2-S2. Proprioception and hot/cold testing deferred on this date   Coordination/Cerebellar Finger to Nose: Not tested Heel to Shin: Not tested Rapid alternating movements: Not tested    FUNCTIONAL OUTCOME MEASURES   FGA: Pt scored 19/30 with the most difficulty with tandem walking, walking with eyes closed, and turning.  : 1.32 m/s with the average of 2 trials, CGA without AD.  : 1200 ft without AD with supervision  5xSTS: 9.88 sec with arms crossed  FOTO - 60% (goal of 72%)   ASSESSMENT Clinical Impression: Pt is a pleasant 80 y/o male presenting with balance and visual deficits following CVA. Pt displays gait speed of 1.32 m/s, and 5xSTS score of 9.88 sec showing decreased risk of falls. PT examination reveals mild strength deficits  in L plantarflexion and dorsiflexion, as well as balance deficits evidenced by a score of 19/30 on the FGA. Pt is able to ambulate 1200 ft indicating mildly reduced muscular endurance for his age norm (1410 ft for males 75-79). Pt FOTO score of 60% indicates perceived difficulty in ADLs and QOL. The pt will benefit from skilled PT to address these deficits in balance, strength, and endurance, in order to improve QOL, decrease risk of falls, and ease of ADLs.   PLAN Next Visit: Initiate dynamic balance and endurance exercise HEP:      Plan - 01/08/21 1219  Clinical Impression Statement Pt is a pleasant 80 y/o male presenting with balance and visual deficits following CVA. Pt displays gait speed of 1.32 m/s, and 5xSTS score of 9.88 sec showing decreased risk of falls. PT examination reveals mild strength deficits in L plantarflexion and dorsiflexion, as well as balance deficits evidenced by a score of 19/30 on the FGA. Pt is able to ambulate 1200 ft indicating mildly reduced muscular endurance for his age norm (1410 ft for males 75-79). Pt FOTO score of 60% indicates perceived difficulty in ADLs and QOL. The pt will benefit from skilled PT to address these deficits in balance, strength, and endurance, in order to improve QOL, decrease risk of falls, and ease of ADLs.    Personal Factors and Comorbidities Age;Comorbidity 3+    Examination-Activity Limitations Stairs;Transfers;Squat;Stand;Lift;Carry;Locomotion Level;Caring for Others    Examination-Participation Restrictions Cleaning;Community Activity;Yard Work;Interpersonal Relationship;Volunteer;Driving;Laundry    Stability/Clinical Decision Making Stable/Uncomplicated    Clinical Decision Making Low    Rehab Potential Good    PT Frequency 2x / week    PT Duration 8 weeks    PT Treatment/Interventions ADLs/Self Care Home Management;Canalith Repostioning;Cryotherapy;Moist Heat;DME Instruction;Gait training;Stair training;Functional mobility  training;Therapeutic activities;Therapeutic exercise;Balance training;Neuromuscular re-education;Patient/family education;Manual techniques;Vestibular;Visual/perceptual remediation/compensation;Joint Manipulations;Spinal Manipulations;Energy conservation;Dry needling;Passive range of motion;Splinting    PT Next Visit Plan Initiate dynamic balance exercise    Consulted and Agree with Plan of Care Patient             Patient will benefit from skilled therapeutic intervention in order to improve the following deficits and impairments:  Abnormal gait, Decreased activity tolerance, Decreased endurance, Decreased strength, Dizziness, Impaired perceived functional ability, Impaired UE functional use, Improper body mechanics, Pain, Decreased balance, Decreased mobility, Difficulty walking, Impaired vision/preception  Visit Diagnosis: Unsteadiness on feet  Difficulty in walking, not elsewhere classified  Muscle weakness (generalized)     Problem List Patient Active Problem List   Diagnosis Date Noted   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, [place], [address] Phone: [phone]   Fax:  [phone]  Name: Kevin Santiago MRN: [id number] Date of Birth: 07/09/1940

## 2021-01-09 NOTE — Therapy (Addendum)
[facility] MAIN [facility] SERVICES [address] [place], [place], [address] Phone: [phone]   Fax:  [phone]  Physical Therapy Evaluation  Patient Details  Name: Kevin Santiago MRN: [id number] Date of Birth: 04-20-1940 Referring Provider (PT): Dr. Pamella Pert, MD   Encounter Date: 01/08/2021   PT End of Session - 01/08/21 1217     Visit Number 1    Number of Visits 17    Date for PT Re-Evaluation 03/05/21    Authorization Time Period Eval: 01/08/21    Progress Note Due on Visit 10    PT Start Time 0930    PT Stop Time 1031    PT Time Calculation (min) 61 min    Equipment Utilized During Treatment Gait belt    Activity Tolerance Patient tolerated treatment well;No increased pain    Behavior During Therapy WFL for tasks assessed/performed             Past Medical History:  Diagnosis Date   Anxiety    Depression    Hypercholesteremia    Hypertension     Past Surgical History:  Procedure Laterality Date   cervical fusion     IR ANGIO INTRA EXTRACRAN SEL INTERNAL CAROTID BILAT MOD SED  01/02/2021   IR ANGIO VERTEBRAL SEL SUBCLAVIAN INNOMINATE UNI L MOD SED  01/02/2021   IR ANGIO VERTEBRAL SEL VERTEBRAL UNI R MOD SED  01/02/2021   IR CT HEAD LTD  01/02/2021   IR INTRA CRAN STENT  01/02/2021   IR NEURO EACH ADD'L AFTER BASIC UNI RIGHT (MS)  01/02/2021   RADIOLOGY WITH ANESTHESIA N/A 01/02/2021   Procedure: IR WITH ANESTHESIA;  Surgeon: Baldemar Lenis, MD;  Location: [facility] OR;  Service: Radiology;  Laterality: N/A;   SHOULDER SURGERY Left 1990   WRIST FRACTURE SURGERY      Vitals:   01/08/21 1648  BP: (!) 144/78  Pulse: (!) 58      Subjective Assessment - 01/08/21 0927     Subjective Pt is a 80 y/o male presenting to PT today with balance and visual deficits following 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 Pt states his friend reported his speech began to slur recommended he eat a cookie and pepsi thinking his blood sugar was low. Pt  felt better ~15 minutes after eating. Pt went to bed that night and woke up at 4 AM on 12/31/20 and felt like "everything went limp" stating he felt some tingling in the LLE and couldn't hold up his L arm. By the time the paramedics arrived he was able to walk to the ambulance. Pt reports he had returned tingling in the LLE in the waiting room. Pt notes he currently has more tingling in the R leg. Pt reports he is experiencing double vision that comes on in waves and "seems to come on when he really focuses on things". 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 He reports his balance as decreased over the past few years even before his stroke. Pt denies any dizziness other than an episode of the room spinning when he woke up about a month ago.  Pt has a cane at home but he doesn't use it and reports he doesn't walk much. Pt reports having intermittent headaches since the stroke and has been taking tylenol PRN for relief. Reports dull achy pain in the back of his neck rated 4/10. Pt reports no recent falls.    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 PMH includes HTN, hypercholestermia, depression, and anxiety. Pt presents to evaluation with balance and visual deficits, mild strength deficits, headaches, and posterior neck pain.    Limitations Standing;Walking;Lifting;House hold activities    How long can you sit comfortably? No limitations    How long can you stand comfortably? about an hour    How long can you walk comfortably? 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 Pamella Pert, MD    Onset Date/Surgical Date 01/02/21    Hand Dominance Right    Next MD Visit 02/23/21    Prior Therapy Yes      Home Environment   Living Environment Private residence    Living Arrangements Alone    Available Help at Discharge Available 24 hours/day;Family    Type of Home House    Home Access Stairs to enter    Entrance Stairs-Number of Steps 2    Entrance Stairs-Rails None    Home Layout One level    Home Equipment [place] - single point;Shower seat             BP: 144/78, SPO2 99, HR 58  OBJECTIVE  Musculoskeletal Tremor: Absent Bulk: Normal Tone: Normal, no clonus   Posture No gross abnormalities noted in standing or seated posture   Gait No gross abnormalities in gait noted   Strength R/L 5/5 Hip flexion 5/5 Hip abduction 5/5 Hip adduction 5/5 Knee extension 5/5 Knee flexion 5/4+ Ankle Plantarflexion 5/4+ Ankle Dorsiflexion   NEUROLOGICAL:  Mental Status Recent memory is intact.  Remote memory is intact.  Attention span and concentration are intact.  Expressive speech is intact.  Patient's fund of knowledge is within normal limits for educational level.   Sensation Grossly intact to light touch bilateral LEs as determined by testing dermatomes L2-S2. Proprioception and hot/cold testing deferred on this date   Coordination/Cerebellar Finger  to Nose: Not tested Heel to Shin: Not tested Rapid alternating movements: Not tested    FUNCTIONAL OUTCOME MEASURES   FGA: Pt scored 19/30 with the most difficulty with tandem walking, walking with eyes closed, and turning.  : 1.32 m/s with the average of 2 trials, CGA without AD.  : 1200 ft without AD with supervision  5xSTS: 9.88 sec with arms crossed  FOTO -  60% (goal of 72%)   ASSESSMENT Clinical Impression: Pt is a pleasant 80 y/o male presenting with balance and visual deficits following CVA. Pt displays gait speed of 1.32 m/s, and 5xSTS score of 9.88 sec showing decreased risk of falls. PT examination reveals mild strength deficits in L plantarflexion and dorsiflexion, as well as balance deficits evidenced by a score of 19/30 on the FGA. Pt is able to ambulate 1200 ft indicating mildly reduced muscular endurance for his age norm (1410 ft for males 75-79). Pt FOTO score of 60% indicates perceived difficulty in ADLs and QOL. The pt will benefit from skilled PT to address these deficits in balance, strength, and endurance, in order to improve QOL, decrease risk of falls, and ease of ADLs.   PLAN Next Visit: Initiate dynamic balance and endurance exercise HEP:       Objective measurements completed on examination: See above findings.       PT Education - 01/08/21 1216     Education Details HEP initiation, Examination findings    Person(s) Educated Patient    Methods Explanation;Demonstration;Verbal cues;Handout    Comprehension Verbalized understanding;Returned demonstration;Verbal cues required              PT Short Term Goals - 01/08/21 1226       PT SHORT TERM GOAL #1   Title Pt will be independent with HEP in order to improve strength and balance in order to decrease fall risk and improve function at home and work.    Baseline 01/08/21: HEP initiated    Time 4    Period Weeks    Status New    Target Date 02/05/21               PT Long  Term Goals - 01/08/21 1227       PT LONG TERM GOAL #1   Title Pt will improve FOTO score to >72% to show improved functional mobility and ease of ADLs.    Baseline 01/08/21: 60%    Time 8    Period Weeks    Status New    Target Date 03/05/21      PT LONG TERM GOAL #2   Title Pt will improve FGA by at least 5 points in order to demonstrate clinically significant improvement in balance and decreased risk for falls.    Baseline 01/08/21: 19/30    Time 8    Period Weeks    Status New    Target Date 03/05/21      PT LONG TERM GOAL #3   Title Pt will increase by at least 51m (136ft) in order to demonstrate clinically significant improvement in cardiopulmonary endurance and community ambulation    Baseline 01/08/21: 1200 ft    Time 8    Period Weeks    Status New    Target Date 03/05/21                    Plan - 01/08/21 1219     Clinical Impression Statement Pt is a pleasant 80 y/o male presenting with balance and visual deficits following CVA. Pt displays gait speed of 1.32 m/s, and 5xSTS score of 9.88 sec showing decreased risk of falls. PT examination reveals mild strength deficits in L plantarflexion and dorsiflexion, as well as balance deficits evidenced by a score of 19/30 on the FGA. Pt is able to ambulate 1200 ft indicating mildly reduced muscular endurance for his age norm (1410 ft for males 75-79). Pt  FOTO score of 60% indicates perceived difficulty in ADLs and QOL. The pt will benefit from skilled PT to address these deficits in balance, strength, and endurance, in order to improve QOL, decrease risk of falls, and ease of ADLs.    Personal Factors and Comorbidities Age;Comorbidity 3+    Examination-Activity Limitations Stairs;Transfers;Squat;Stand;Lift;Carry;Locomotion Level;Caring for Others    Examination-Participation Restrictions Cleaning;Community Activity;Yard Work;Interpersonal Relationship;Volunteer;Driving;Laundry    Stability/Clinical Decision Making  Stable/Uncomplicated    Clinical Decision Making Low    Rehab Potential Good    PT Frequency 2x / week    PT Duration 8 weeks    PT Treatment/Interventions ADLs/Self Care Home Management;Canalith Repostioning;Cryotherapy;Moist Heat;DME Instruction;Gait training;Stair training;Functional mobility training;Therapeutic activities;Therapeutic exercise;Balance training;Neuromuscular re-education;Patient/family education;Manual techniques;Vestibular;Visual/perceptual remediation/compensation;Joint Manipulations;Spinal Manipulations;Energy conservation;Dry needling;Passive range of motion;Splinting    PT Next Visit Plan Initiate dynamic balance exercise    Consulted and Agree with Plan of Care Patient             Patient will benefit from skilled therapeutic intervention in order to improve the following deficits and impairments:  Abnormal gait, Decreased activity tolerance, Decreased endurance, Decreased strength, Dizziness, Impaired perceived functional ability, Impaired UE functional use, Improper body mechanics, Pain, Decreased balance, Decreased mobility, Difficulty walking, Impaired vision/preception  Visit Diagnosis: Unsteadiness on feet  Difficulty in walking, not elsewhere classified  Muscle weakness (generalized)     Problem List Patient Active Problem List   Diagnosis Date Noted   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 I have personally read, edited and approve of the note as written.   Louis Meckel, PT 01/09/2021, 8:17 AM  [place] [facility] MAIN [facility] SERVICES [address] [place], [place], [address] Phone: [phone]   Fax:  [phone]  Name: Kevin Santiago MRN: [id number] Date of Birth: [birth year]

## 2021-01-13 ENCOUNTER — Other Ambulatory Visit: Payer: Self-pay

## 2021-01-13 ENCOUNTER — Ambulatory Visit: Payer: Medicare Other

## 2021-01-13 DIAGNOSIS — R2681 Unsteadiness on feet: Secondary | ICD-10-CM | POA: Diagnosis not present

## 2021-01-13 DIAGNOSIS — R269 Unspecified abnormalities of gait and mobility: Secondary | ICD-10-CM

## 2021-01-13 DIAGNOSIS — R262 Difficulty in walking, not elsewhere classified: Secondary | ICD-10-CM

## 2021-01-13 NOTE — Therapy (Signed)
[facility] MAIN [facility] SERVICES [address] [place], [place], [address] Phone: [phone]   Fax:  [phone]  Physical Therapy Treatment  Patient Details  Name: Kevin Santiago MRN: [id number] Date of Birth: [birth year] Referring Provider (PT): Dr. Pamella Pert, MD   Encounter Date: 01/13/2021   PT End of Session - 01/13/21 1552     Visit Number 2    Number of Visits 17    Date for PT Re-Evaluation 03/05/21    Authorization Time Period Eval: 01/08/21    Progress Note Due on Visit 10    PT Start Time 1102    PT Stop Time 1144    PT Time Calculation (min) 42 min    Equipment Utilized During Treatment Gait belt    Activity Tolerance Patient tolerated treatment well;No increased pain    Behavior During Therapy WFL for tasks assessed/performed             Past Medical History:  Diagnosis Date   Anxiety    Depression    Hypercholesteremia    Hypertension     Past Surgical History:  Procedure Laterality Date   cervical fusion     IR ANGIO INTRA EXTRACRAN SEL INTERNAL CAROTID BILAT MOD SED  01/02/2021   IR ANGIO VERTEBRAL SEL SUBCLAVIAN INNOMINATE UNI L MOD SED  01/02/2021   IR ANGIO VERTEBRAL SEL VERTEBRAL UNI R MOD SED  01/02/2021   IR CT HEAD LTD  01/02/2021   IR INTRA CRAN STENT  01/02/2021   IR NEURO EACH ADD'L AFTER BASIC UNI RIGHT (MS)  01/02/2021   RADIOLOGY WITH ANESTHESIA N/A 01/02/2021   Procedure: IR WITH ANESTHESIA;  Surgeon: Baldemar Lenis, MD;  Location: [facility] OR;  Service: Radiology;  Laterality: N/A;   SHOULDER SURGERY Left 1990   WRIST FRACTURE SURGERY      There were no vitals filed for this visit.   Subjective Assessment - 01/13/21 1551     Subjective Patient reports still unsteady with mobility at times. Reports less double vision and more general blurry vision now. He denies any falls.    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 PMH includes HTN, hypercholestermia, depression, and anxiety. Pt presents to evaluation with balance and visual deficits, mild strength deficits, headaches, and posterior neck pain.    Limitations Standing;Walking;Lifting;House hold activities    How long can you sit comfortably? No limitations    How long can you stand comfortably? about an hour    How long can you walk comfortably? 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 No/denies                INTERVENTIONS    Neuromuscular re-ed: at support bar  Feet apart- with eyes open, closed, head turns with  eyes open/closed Feet narrowed- with eyes open, closed, head turns with eyes open/closed Feet staggered-with eyes open, closed, head turns with eyes open/closed Feet Tandem with eyes open, closed, head turns with eyes open/closed SLS- 20 sec each BLE x 5 trials each.  *Patient performed well with no LOB or unsteadiness until tandem standing with EO/EC and then with SLS.   Walking in hallway- 80 feet x 4 trials- with horizontal head turns, CGA- with VC to walk slower for more control. Patient unsteady with dynamic movement with gait deviation more than 1 ft. Laterally.   Access Code: [email or web address] URL: [email or web address] Date: 01/13/2021 Prepared by: Maureen Ralphs, PT  Exercises Tandem Stance with Eyes Closed in Corner - 1 x  daily - 7 x weekly - 3 sets - 20 hold Tandem Stance with Eyes Closed and Head Rotation on Foam Pad - 1 x daily - 7 x weekly - 3 sets - 10 reps Single Leg Stance - 1 x daily - 7 x weekly - 3 sets - as long as poss hold Walking with Head Rotation - 1 x daily - 7 x weekly - 3-4 sets                   PT Education - 01/13/21 1552     Education Details Exercise technique    Person(s) Educated Patient    Methods Explanation;Demonstration;Tactile cues;Verbal cues;Handout    Comprehension Verbalized understanding;Returned demonstration;Verbal cues required;Tactile cues required;Need further instruction              PT Short Term Goals - 01/08/21 1226       PT SHORT TERM GOAL #1   Title Pt will be independent with HEP in order to improve strength and balance in order to decrease fall risk and improve function at home and work.    Baseline 01/08/21: HEP initiated    Time 4    Period Weeks    Status New    Target Date 02/05/21               PT Long Term Goals - 01/08/21 1227       PT LONG TERM GOAL #1   Title Pt will improve FOTO score to >72% to show improved functional mobility and ease of ADLs.    Baseline 01/08/21: 60%    Time 8    Period Weeks    Status New    Target Date 03/05/21      PT LONG TERM GOAL #2   Title Pt will improve FGA by at least 5 points in order to demonstrate clinically significant improvement in balance and decreased risk for falls.    Baseline 01/08/21: 19/30    Time 8    Period Weeks    Status New    Target Date 03/05/21      PT LONG TERM GOAL #3   Title Pt will increase by at least 79m (170ft) in order to demonstrate clinically significant improvement in cardiopulmonary endurance and community ambulation    Baseline 01/08/21: 1200 ft    Time 8    Period Weeks    Status New    Target Date 03/05/21                   Plan - 01/13/21 1550     Clinical Impression Statement Patient challenged today with higher  level balance exercises including static tandem standing and Single leg stance. He also demo some difficulty with dynamic walking with head  turns and did improve with VC to slow down. He performed static balance well and able to add to home program without difficulty. Patient is able to stand well in all other stances- wide/narrowed and staggered and does present and visually preferenced. He will benefit from continued PT skilled intervention including static/dynamic balance and functional mobility training to improve his overall balance and reduce risk of falling and improve his quality of life.    Personal Factors and Comorbidities Age;Comorbidity 3+    Examination-Activity Limitations Stairs;Transfers;Squat;Stand;Lift;Carry;Locomotion Level;Caring for Others    Examination-Participation Restrictions Cleaning;Community Activity;Yard Work;Interpersonal Relationship;Volunteer;Driving;Laundry    Stability/Clinical Decision Making Stable/Uncomplicated    Rehab Potential Good    PT Frequency 2x / week    PT Duration 8 weeks    PT Treatment/Interventions ADLs/Self Care Home Management;Canalith Repostioning;Cryotherapy;Moist Heat;DME Instruction;Gait training;Stair training;Functional mobility training;Therapeutic activities;Therapeutic exercise;Balance training;Neuromuscular re-education;Patient/family education;Manual techniques;Vestibular;Visual/perceptual remediation/compensation;Joint Manipulations;Spinal Manipulations;Energy conservation;Dry needling;Passive range of motion;Splinting    PT Next Visit Plan Continue with dynamic balance exercise    PT Home Exercise Plan Access Code: [email or web address]    Consulted and Agree with Plan of Care Patient             Patient will benefit from skilled therapeutic intervention in order to improve the following deficits and impairments:  Abnormal gait, Decreased activity tolerance, Decreased endurance, Decreased strength, Dizziness, Impaired perceived functional  ability, Impaired UE functional use, Improper body mechanics, Pain, Decreased balance, Decreased mobility, Difficulty walking, Impaired vision/preception  Visit Diagnosis: Abnormality of gait and mobility  Difficulty in walking, not elsewhere classified  Unsteadiness on feet     Problem List Patient Active Problem List   Diagnosis Date Noted   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 [place], [place], [address] Phone: [phone]   Fax:  [phone]  Name: Kevin Santiago MRN: [id number] Date of Birth: 1941-01-06

## 2021-01-15 ENCOUNTER — Other Ambulatory Visit: Payer: Self-pay

## 2021-01-15 ENCOUNTER — Ambulatory Visit: Payer: Medicare Other

## 2021-01-15 DIAGNOSIS — R2681 Unsteadiness on feet: Secondary | ICD-10-CM | POA: Diagnosis not present

## 2021-01-15 DIAGNOSIS — R262 Difficulty in walking, not elsewhere classified: Secondary | ICD-10-CM

## 2021-01-15 DIAGNOSIS — R269 Unspecified abnormalities of gait and mobility: Secondary | ICD-10-CM

## 2021-01-15 NOTE — Therapy (Addendum)
[facility] MAIN [facility] SERVICES [address] [place], [place], [address] Phone: [phone]   Fax:  [phone]  Physical Therapy Treatment  Patient Details  Name: Kevin Santiago MRN: [id number] Date of Birth: Jan 19, 1941 Referring Provider (PT): Dr. Pamella Pert, MD   Encounter Date: 01/15/2021   PT End of Session - 01/15/21 1158     Visit Number 3    Number of Visits 17    Date for PT Re-Evaluation 03/05/21    Authorization Time Period Eval: 01/08/21    Progress Note Due on Visit 10    PT Start Time 1101    PT Stop Time 1146    PT Time Calculation (min) 45 min    Equipment Utilized During Treatment Gait belt    Activity Tolerance Patient tolerated treatment well    Behavior During Therapy [facility] for tasks assessed/performed             Past Medical History:  Diagnosis Date   Anxiety    Depression    Hypercholesteremia    Hypertension     Past Surgical History:  Procedure Laterality Date   cervical fusion     IR ANGIO INTRA EXTRACRAN SEL INTERNAL CAROTID BILAT MOD SED  01/02/2021   IR ANGIO VERTEBRAL SEL SUBCLAVIAN INNOMINATE UNI L MOD SED  01/02/2021   IR ANGIO VERTEBRAL SEL VERTEBRAL UNI R MOD SED  01/02/2021   IR CT HEAD LTD  01/02/2021   IR INTRA CRAN STENT  01/02/2021   IR NEURO EACH ADD'L AFTER BASIC UNI RIGHT (MS)  01/02/2021   RADIOLOGY WITH ANESTHESIA N/A 01/02/2021   Procedure: IR WITH ANESTHESIA;  Surgeon: Baldemar Lenis, MD;  Location: [facility] OR;  Service: Radiology;  Laterality: N/A;   SHOULDER SURGERY Left 1990   WRIST FRACTURE SURGERY      There were no vitals filed for this visit.   Subjective Assessment - 01/15/21 1101     Subjective Patient still reports feeling unsteadiness and notes specifically with turns recently. He reports his vision is continuing to improve. He denies any stumbles or falls.    Pertinent History Pt is a 80 y/o male with balance and visual deficits following CVA - infarct in L  pons and R posterior pons. 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 PMH includes HTN, hypercholestermia, depression, and anxiety. Pt presents to evaluation with balance and visual deficits, mild strength deficits, headaches, and posterior neck pain.    Limitations Standing;Walking;Lifting;House hold activities    How long can you sit comfortably? No limitations    How long can you stand comfortably? about an hour    How long can you walk comfortably? 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 No/denies              Neuromuscular re-ed: all interventions with CGA unless otherwise stated  At support bar with CGA:   NBOS on half  bosu - flat side down, EO 2 x 45 sec.  NBOS on half bosu - flat side down, EO with horizontal head turns 2 x 45 sec. Pt displays mild unsteadiness and requires intermittent UE support NBOS on half bosu - flat side down, EO with vertical head turns 2 x 45 sec. Pt displays mild unsteadiness and requires intermittent UE support NBOS on half bosu - flat side down, EC x 30 sec. Pt displays severe unsteadiness requiring UE to avoid fall   Performed in the hallway, all with CGA:  Ambulation in hallway- 80 feet x 4 trials- with horizontal head turns reading the post-it notes on the wall. Patient unsteady with dynamic movement with gait deviation more than 1 ft. Laterally.   Ambulation in hallway- 80 feet x 4 trials- with vertical head turns..  Patient displays improved lateral deviation.   Figure 8 ambulation through 3 cones x multiple rounds to work on turning to the R and L. Pt displays more unsteadiness in gait with turning to the R  Semitandem walking 80 feet x 2 trials, pt displays good correction with slight unsteadiness but no LOB  Tandem walking 80 feet x 2 trials, Pt displays increased lateral sway and requires occasional step strategy to regain balance. Pt has one major LOB requiring step strategy to recover. Pt shows improvements in balance with trials.  Ladder drills - reciprocal forward walking x 4 lengths Ladder drills - side stepping x 4 lengths Comments: pt requiring minimal cuing and completes without unsteadiness or LOB  Star balance SLS exercise, 1 trial each side - cues to tap each point on the star.  - Progressed to toe taps with a cone on each point. Pt displays unsteadiness and requires intermittent step strategy to regain balance.   On the [employer]:  Resistive gait with 22.5 lbs in each of the following directions: forward, backward, left, right x 3 trials each direction. VC to control steps slowly to improve eccentric control.            PT Education - 01/15/21 1156     Education Details exercise technique    Person(s) Educated Patient    Methods Explanation;Demonstration;Tactile cues;Verbal cues;Handout    Comprehension Verbalized understanding;Returned demonstration;Verbal cues required;Tactile cues required;Need further instruction              PT Short Term Goals - 01/08/21 1226       PT SHORT TERM GOAL #1   Title Pt will be independent with HEP in order to improve strength and balance in order to decrease fall risk and improve function at home and work.    Baseline 01/08/21: HEP initiated    Time 4    Period Weeks    Status New    Target Date 02/05/21               PT Long Term Goals - 01/08/21 1227       PT LONG TERM GOAL #1   Title Pt will improve  FOTO score to >72% to show improved functional mobility and ease of ADLs.    Baseline 01/08/21: 60%    Time 8    Period Weeks    Status New    Target Date 03/05/21      PT LONG TERM GOAL #2   Title Pt will improve FGA by at least 5 points in order to demonstrate clinically significant improvement in balance and decreased risk for falls.    Baseline 01/08/21: 19/30  Time 8    Period Weeks    Status New    Target Date 03/05/21      PT LONG TERM GOAL #3   Title Pt will increase by at least 41m (133ft) in order to demonstrate clinically significant improvement in cardiopulmonary endurance and community ambulation    Baseline 01/08/21: 1200 ft    Time 8    Period Weeks    Status New    Target Date 03/05/21                   Plan - 01/15/21 1201     Clinical Impression Statement Pt presented with great motivation for all interventions today. He was able to progress static balance and was challenged when standing on the half bosu with head turns. Dynamic balance has improved and pt was able to progress to tandem walking. While he was challenged by tandem walking, he showed vast improvements in steadiness from evaluation. He will benefit from continued skilled PT intervention including static/dynamic balance and functional mobility training to improve his overall balance and reduce risk of falling and improve his quality of life    Personal Factors and Comorbidities Age;Comorbidity 3+    Examination-Activity Limitations Stairs;Transfers;Squat;Stand;Lift;Carry;Locomotion Level;Caring for Others    Examination-Participation Restrictions Cleaning;Community Activity;Yard Work;Interpersonal Relationship;Volunteer;Driving;Laundry    Stability/Clinical Decision Making Stable/Uncomplicated    Rehab Potential Good    PT Frequency 2x / week    PT Duration 8 weeks    PT Treatment/Interventions ADLs/Self Care Home Management;Canalith Repostioning;Cryotherapy;Moist Heat;DME  Instruction;Gait training;Stair training;Functional mobility training;Therapeutic activities;Therapeutic exercise;Balance training;Neuromuscular re-education;Patient/family education;Manual techniques;Vestibular;Visual/perceptual remediation/compensation;Joint Manipulations;Spinal Manipulations;Energy conservation;Dry needling;Passive range of motion;Splinting    PT Next Visit Plan Continue with dynamic balance exercise    PT Home Exercise Plan Access Code: [email or web address]    Consulted and Agree with Plan of Care Patient             Patient will benefit from skilled therapeutic intervention in order to improve the following deficits and impairments:  Abnormal gait, Decreased activity tolerance, Decreased endurance, Decreased strength, Dizziness, Impaired perceived functional ability, Impaired UE functional use, Improper body mechanics, Pain, Decreased balance, Decreased mobility, Difficulty walking, Impaired vision/preception  Visit Diagnosis: Abnormality of gait and mobility  Difficulty in walking, not elsewhere classified  Unsteadiness on feet     Problem List Patient Active Problem List   Diagnosis Date Noted   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 I have personally read, edited and approve of the note as written.   Louis Meckel, PT  01/15/2021, 12:27 PM  [facility] MAIN [facility] SERVICES [address] [place], [place], [address] Phone: [phone]   Fax:  [phone]  Name: EMRE STOCK MRN: [id number] Date of Birth: 1940-12-13

## 2021-01-20 ENCOUNTER — Other Ambulatory Visit: Payer: Self-pay

## 2021-01-20 ENCOUNTER — Ambulatory Visit: Payer: Medicare Other

## 2021-01-20 DIAGNOSIS — R262 Difficulty in walking, not elsewhere classified: Secondary | ICD-10-CM

## 2021-01-20 DIAGNOSIS — R2681 Unsteadiness on feet: Secondary | ICD-10-CM

## 2021-01-20 DIAGNOSIS — R269 Unspecified abnormalities of gait and mobility: Secondary | ICD-10-CM

## 2021-01-20 NOTE — Therapy (Signed)
[facility] MAIN [facility] SERVICES [address] [place], [place], [address] Phone: [phone]   Fax:  [phone]  Physical Therapy Treatment  Patient Details  Name: Kevin Santiago MRN: [id number] Date of Birth: Nov 21, 1940 Referring Provider (PT): Dr. Pamella Pert, MD   Encounter Date: 01/20/2021   PT End of Session - 01/20/21 1714     Visit Number 4    Number of Visits 17    Date for PT Re-Evaluation 03/05/21    Authorization Time Period Eval: 01/08/21    Progress Note Due on Visit 10    PT Start Time 1100    PT Stop Time 1144    PT Time Calculation (min) 44 min    Equipment Utilized During Treatment Gait belt    Activity Tolerance Patient tolerated treatment well    Behavior During Therapy [facility] for tasks assessed/performed             Past Medical History:  Diagnosis Date   Anxiety    Depression    Hypercholesteremia    Hypertension     Past Surgical History:  Procedure Laterality Date   cervical fusion     IR ANGIO INTRA EXTRACRAN SEL INTERNAL CAROTID BILAT MOD SED  01/02/2021   IR ANGIO VERTEBRAL SEL SUBCLAVIAN INNOMINATE UNI L MOD SED  01/02/2021   IR ANGIO VERTEBRAL SEL VERTEBRAL UNI R MOD SED  01/02/2021   IR CT HEAD LTD  01/02/2021   IR INTRA CRAN STENT  01/02/2021   IR NEURO EACH ADD'L AFTER BASIC UNI RIGHT (MS)  01/02/2021   RADIOLOGY WITH ANESTHESIA N/A 01/02/2021   Procedure: IR WITH ANESTHESIA;  Surgeon: Baldemar Lenis, MD;  Location: [facility] OR;  Service: Radiology;  Laterality: N/A;   SHOULDER SURGERY Left 1990   WRIST FRACTURE SURGERY      There were no vitals filed for this visit.   Subjective Assessment - 01/20/21 1711     Subjective Patient still reports still unsteady when walking but feels like he is doing better overall.    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 PMH includes HTN, hypercholestermia, depression, and anxiety. Pt presents to evaluation with balance and visual deficits, mild strength deficits, headaches, and posterior neck pain.    Limitations Standing;Walking;Lifting;House hold activities    How long can you sit comfortably? No limitations    How long can you stand comfortably? about an hour    How long can you walk comfortably? 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 Yes    Pain Score 3     Pain Location Neck    Pain Orientation Posterior;Right    Pain Descriptors / Indicators Aching    Pain Type Chronic pain  INTERVENTIONS: Patient was complaining of some right upper neck pain so began session with some manual stretching.   Manual therapy:   Suboccipital release- hold 30 sec x 4 Manual STM to right sided levator and Upper fibers of UT Grade 2-3 PA vertebral mobs   Therex: Instructed patient in cervical rotation stretch- using towel with instruction to hold x 30 sec x 3 each direction. Patient able to report feeling some better after manual therapy and stretching.   Neuromuscular re-education   -Star balance SLS exercise, 1 trial each side - cues to tap each hedgehog placed at end on the star- Pt displays unsteadiness and requires intermittent step strategy to regain balance. He performed for several minutes and improved with each trial - CGA for assist.    -Step tap onto 2nd step (at  stairs) without UE support- No Unsteadiness and patient reported as easy.   -tandem standing on 1/2 foam- hold 30 sec x 3 trials each leg- no unsteadiness and patient able to hold position well without difficulty.  -Education provided throughout session via VC/TC and demonstration to facilitate movement at target joints and correct muscle activation for all testing and exercises performed.                         PT Education - 01/21/21 1712     Education Details Exercise technique    Person(s) Educated Patient    Methods Explanation;Demonstration;Tactile cues;Verbal cues    Comprehension Verbalized understanding;Returned demonstration;Verbal cues required;Tactile cues required;Need further instruction              PT Short Term Goals - 01/08/21 1226       PT SHORT TERM GOAL #1   Title Pt will be independent with HEP in order to improve strength and balance in order to decrease fall risk and improve function at home and work.    Baseline 01/08/21: HEP initiated    Time 4    Period Weeks    Status New    Target Date 02/05/21               PT Long Term Goals - 01/08/21 1227       PT LONG TERM GOAL #1   Title Pt will improve FOTO score to >72% to show improved functional mobility and ease of ADLs.    Baseline 01/08/21: 60%    Time 8    Period Weeks    Status New    Target Date 03/05/21      PT LONG TERM GOAL #2   Title Pt will improve FGA by at least 5 points in order to demonstrate clinically significant improvement in balance and decreased risk for falls.    Baseline 01/08/21: 19/30    Time 8    Period Weeks    Status New    Target Date 03/05/21      PT LONG TERM GOAL #3   Title Pt will increase by at least 85m (142ft) in order to demonstrate clinically significant improvement in cardiopulmonary endurance and community ambulation    Baseline 01/08/21: 1200 ft    Time 8    Period Weeks    Status New    Target Date 03/05/21                    Plan - 01/20/21 1717     Clinical Impression Statement Patient presented with excellent motivation and improving dynamic standing balance and seen  by less unsteadiness with step tapping and good ability to perform tandem standing on 1/2 foam without any significant LOB. He will benefit from continued skilled PT intervention including static/dynamic balance and functional mobility training to improve his overall balance and reduce risk of falling and improve his quality of life    Personal Factors and Comorbidities Age;Comorbidity 3+    Examination-Activity Limitations Stairs;Transfers;Squat;Stand;Lift;Carry;Locomotion Level;Caring for Others    Examination-Participation Restrictions Cleaning;Community Activity;Yard Work;Interpersonal Relationship;Volunteer;Driving;Laundry    Stability/Clinical Decision Making Stable/Uncomplicated    Rehab Potential Good    PT Frequency 2x / week    PT Duration 8 weeks    PT Treatment/Interventions ADLs/Self Care Home Management;Canalith Repostioning;Cryotherapy;Moist Heat;DME Instruction;Gait training;Stair training;Functional mobility training;Therapeutic activities;Therapeutic exercise;Balance training;Neuromuscular re-education;Patient/family education;Manual techniques;Vestibular;Visual/perceptual remediation/compensation;Joint Manipulations;Spinal Manipulations;Energy conservation;Dry needling;Passive range of motion;Splinting    PT Next Visit Plan Continue with dynamic balance exercise    PT Home Exercise Plan Access Code: [email or web address]    Consulted and Agree with Plan of Care Patient             Patient will benefit from skilled therapeutic intervention in order to improve the following deficits and impairments:  Abnormal gait, Decreased activity tolerance, Decreased endurance, Decreased strength, Dizziness, Impaired perceived functional ability, Impaired UE functional use, Improper body mechanics, Pain, Decreased balance, Decreased  mobility, Difficulty walking, Impaired vision/preception  Visit Diagnosis: Abnormality of gait and mobility  Difficulty in walking, not elsewhere classified  Unsteadiness on feet     Problem List Patient Active Problem List   Diagnosis Date Noted   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   Hypertension    Hypercholesteremia    Anxiety    BPH (benign prostatic hyperplasia)    GERD (gastroesophageal reflux disease)    CVA (cerebral vascular accident) (HCC) 12/31/2020   Vitamin D insufficiency 07/04/2014   Hypocalcemia 07/02/2014   Prediabetes 07/02/2014   AKI (acute kidney injury) (HCC) 07/02/2014   Other pancytopenia (HCC)    Hemoperitoneum    Thrombocytopenia (HCC)    Splenomegaly 07/01/2014   Spleen injury 07/01/2014    Lenda Kelp, PT 01/21/2021, 5:31 PM  [place] [facility] MAIN [facility] SERVICES [address] [place], [place], [address] Phone: [phone]   Fax:  [phone]  Name: Kevin Santiago MRN: [id number] Date of Birth: 03/29/1940

## 2021-01-22 ENCOUNTER — Ambulatory Visit: Payer: Medicare Other

## 2021-01-27 ENCOUNTER — Ambulatory Visit: Payer: Medicare Other

## 2021-01-27 ENCOUNTER — Other Ambulatory Visit: Payer: Self-pay

## 2021-01-27 DIAGNOSIS — M6281 Muscle weakness (generalized): Secondary | ICD-10-CM

## 2021-01-27 DIAGNOSIS — R262 Difficulty in walking, not elsewhere classified: Secondary | ICD-10-CM

## 2021-01-27 DIAGNOSIS — R269 Unspecified abnormalities of gait and mobility: Secondary | ICD-10-CM

## 2021-01-27 DIAGNOSIS — R2681 Unsteadiness on feet: Secondary | ICD-10-CM | POA: Diagnosis not present

## 2021-01-27 DIAGNOSIS — R278 Other lack of coordination: Secondary | ICD-10-CM

## 2021-01-27 NOTE — Therapy (Signed)
[place] MAIN [facility] SERVICES [address] [place], [place], [address] Phone: [phone]   Fax:  [phone]  Physical Therapy Treatment  Patient Details  Name: Kevin Santiago MRN: [id number] Date of Birth: [birth year] Referring Provider (PT): Dr. Marzetta Board, MD   Encounter Date: 01/27/2021   PT End of Session - 01/27/21 1438     Visit Number 5    Number of Visits 17    Date for PT Re-Evaluation 03/05/21    Authorization Time Period Eval: 01/08/21    Progress Note Due on Visit 10    PT Start Time 1348    PT Stop Time 1427    PT Time Calculation (min) 39 min    Equipment Utilized During Treatment Gait belt    Activity Tolerance Patient tolerated treatment well    Behavior During Therapy [facility] for tasks assessed/performed             Past Medical History:  Diagnosis Date   Anxiety    Depression    Hypercholesteremia    Hypertension     Past Surgical History:  Procedure Laterality Date   cervical fusion     IR ANGIO INTRA EXTRACRAN SEL INTERNAL CAROTID BILAT MOD SED  01/02/2021   IR ANGIO VERTEBRAL SEL SUBCLAVIAN INNOMINATE UNI L MOD SED  01/02/2021   IR ANGIO VERTEBRAL SEL VERTEBRAL UNI R MOD SED  01/02/2021   IR CT HEAD LTD  01/02/2021   IR INTRA CRAN STENT  01/02/2021   IR NEURO EACH ADD'L AFTER BASIC UNI RIGHT (MS)  01/02/2021   RADIOLOGY WITH ANESTHESIA N/A 01/02/2021   Procedure: IR WITH ANESTHESIA;  Surgeon: Pedro Earls, MD;  Location: [place];  Service: Radiology;  Laterality: N/A;   SHOULDER SURGERY Left 1990   WRIST FRACTURE SURGERY      There were no vitals filed for this visit.   Subjective Assessment - 01/27/21 1353     Subjective Patient reports he went to eye MD and going to change his glasses. He denied any pain presently but states his neck does bother him some. He reports his balance is dramatically better and has been working on cars including removing engine from car yesterday.     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 PMH includes HTN, hypercholestermia, depression, and anxiety. Pt presents to evaluation with balance and visual deficits, mild strength deficits, headaches, and posterior neck pain.    Limitations Standing;Walking;Lifting;House hold activities    How long can you sit comfortably? No limitations    How long can you stand comfortably? about an hour    How long can you walk comfortably? 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 No/denies             INTERVENTIONS:  Neuromuscular re-education:    Full lunge squat in // bars without UE support- x 15 reps BLE- Mild unsteadiness during 1st couple of reps then performed well- able to perform deep squat without difficulty returning to standing position  Step - high knees - dynamic marching on airex blue pad x 20 reps each leg- mild unsteadiness yet no LOB or reaching for UE support.   -Dynamic walking with head turning reaching for ball on right side then turning to left to pass to PT walking behind him- No LOB or dizziness x 30 feet x 4.   - Dynamic side stepping in long hallway (30 feet) with ball toss against wall focusing on coordination and no sign of right side neglect - down and back x 2 trials.   - Bounce ball against wall with side stepping x 30  feet x 2 trials- Mild difficulty with coordination yet no LOB.  - Bounce ball to wall on left side then to right side - while walking down middle of hallway x 30 feet x 2 trials- No LOB.   Education provided throughout session via VC/TC and demonstration to facilitate movement at target joints and correct muscle activation for all testing and exercises performed.    Reassess FGA goal: 29/30 (initially 19/30) - GOAL MET                          PT Education - 01/27/21 1437     Education Details Exercise technique    Person(s) Educated Patient    Methods Explanation;Demonstration;Tactile cues;Verbal cues    Comprehension Verbalized understanding;Returned demonstration;Verbal cues required;Tactile cues required;Need further instruction              PT Short Term Goals - 01/08/21 1226       PT SHORT TERM GOAL #1   Title Pt will be independent with HEP in order to improve strength and balance in order to decrease fall risk and improve function at home and work.    Baseline 01/08/21: HEP initiated    Time 4    Period Weeks    Status New    Target Date 02/05/21               PT Long Term Goals - 01/27/21 1552       PT LONG TERM GOAL #1   Title Pt will improve FOTO score to >72% to show improved functional mobility and ease of ADLs.    Baseline 01/08/21: 60%    Time 8    Period Weeks    Status New    Target Date 03/05/21      PT LONG TERM GOAL #2   Title Pt will improve FGA by at least 5 points in order to demonstrate clinically significant improvement in balance and decreased risk for falls.    Baseline 01/08/21: 19/30; 01/27/2021= 29/30    Time 8    Period Weeks    Status Achieved    Target Date 03/05/21      PT LONG TERM GOAL #3   Title Pt will increase 6MWT by at least 41m(1666f in order to demonstrate clinically significant improvement in cardiopulmonary endurance and community ambulation    Baseline 01/08/21: 1200 ft    Time 8    Period  Weeks    Status New    Target Date 03/05/21                   Plan -  01/27/21 1438     Clinical Impression Statement Patient presents today with great motivation and participated well with higher level balance activities with minimal to no difficulty with coordination of all tasks. He was able to walk while performing a coordination activity without gait deviation. He presents overall with much improved dynamic standing balance and has achieved his dynamic balance goal (FGA= 29/30) indicating very low fall risk. Plan discussed to cancel both visits scheduled for next week and allow patient a chance to perform his exercises on his own and then return to clinic on 1/10 for reassessment and possible discharge if appropriate.    Personal Factors and Comorbidities Age;Comorbidity 3+    Examination-Activity Limitations Stairs;Transfers;Squat;Stand;Lift;Carry;Locomotion Level;Caring for Others    Examination-Participation Restrictions Cleaning;Community Activity;Yard Work;Interpersonal Relationship;Volunteer;Driving;Laundry    Stability/Clinical Decision Making Stable/Uncomplicated    Rehab Potential Good    PT Frequency 2x / week    PT Duration 8 weeks    PT Treatment/Interventions ADLs/Self Care Home Management;Canalith Repostioning;Cryotherapy;Moist Heat;DME Instruction;Gait training;Stair training;Functional mobility training;Therapeutic activities;Therapeutic exercise;Balance training;Neuromuscular re-education;Patient/family education;Manual techniques;Vestibular;Visual/perceptual remediation/compensation;Joint Manipulations;Spinal Manipulations;Energy conservation;Dry needling;Passive range of motion;Splinting    PT Next Visit Plan Patient to practice his walking and HEP for next 2 weeks then return to clinic for scheduled visit on 02/11/2020 for reassessment and possible discharge if continuing to do well and goals met. Patient verbalized and agreed to plan.    PT Home Exercise Plan Access  Code: [email or web address]    Consulted and Agree with Plan of Care Patient             Patient will benefit from skilled therapeutic intervention in order to improve the following deficits and impairments:  Abnormal gait, Decreased activity tolerance, Decreased endurance, Decreased strength, Dizziness, Impaired perceived functional ability, Impaired UE functional use, Improper body mechanics, Pain, Decreased balance, Decreased mobility, Difficulty walking, Impaired vision/preception  Visit Diagnosis: Abnormality of gait and mobility  Difficulty in walking, not elsewhere classified  Muscle weakness (generalized)  Other lack of coordination  Unsteadiness on feet     Problem List Patient Active Problem List   Diagnosis Date Noted   Stroke (cerebrum) ([place]) 01/02/2021   Hypertension    Hypercholesteremia    Anxiety    BPH (benign prostatic hyperplasia)    GERD (gastroesophageal reflux disease)    CVA (cerebral vascular accident) ([place]) 12/31/2020   Vitamin D insufficiency 07/04/2014   Hypocalcemia 07/02/2014   Prediabetes 07/02/2014   AKI (acute kidney injury) ([place]) 07/02/2014   Other pancytopenia ([place])    Hemoperitoneum    Thrombocytopenia ([place])    Splenomegaly 07/01/2014   Spleen injury 07/01/2014    Lewis Moccasin, PT 01/27/2021, 3:58 PM  [place] MAIN [facility] SERVICES [address] [place], [place], [address] Phone: [phone]   Fax:  [phone]  Name: CARMINE CARROZZA MRN: [id number] Date of Birth: 1940/09/03

## 2021-01-29 ENCOUNTER — Encounter (HOSPITAL_COMMUNITY): Payer: Self-pay | Admitting: Radiology

## 2021-02-03 ENCOUNTER — Ambulatory Visit: Payer: Medicare PPO

## 2021-02-05 ENCOUNTER — Ambulatory Visit: Payer: No Typology Code available for payment source

## 2021-02-10 ENCOUNTER — Other Ambulatory Visit: Payer: Self-pay

## 2021-02-10 ENCOUNTER — Ambulatory Visit: Payer: Medicare PPO | Attending: Internal Medicine

## 2021-02-10 DIAGNOSIS — R269 Unspecified abnormalities of gait and mobility: Secondary | ICD-10-CM | POA: Diagnosis present

## 2021-02-10 DIAGNOSIS — R2681 Unsteadiness on feet: Secondary | ICD-10-CM | POA: Diagnosis present

## 2021-02-10 NOTE — Therapy (Signed)
[place] MAIN [facility] SERVICES [address] [place], [place], [address] Phone: [phone]   Fax:  [phone]  Physical Therapy Treatment/PT Discharge Summary  Patient Details  Name: Kevin Santiago MRN: [id number] Date of Birth: 02-Sep-1940 Referring Provider (PT): Dr. Marzetta Board, MD   Encounter Date: 02/10/2021   PT End of Session - 02/10/21 1408     Visit Number 6    Number of Visits 17    Date for PT Re-Evaluation 03/05/21    Authorization Time Period Eval: 01/08/21    Progress Note Due on Visit 10    PT Start Time 1300    PT Stop Time 1334    PT Time Calculation (min) 34 min    Equipment Utilized During Treatment Gait belt    Activity Tolerance Patient tolerated treatment well    Behavior During Therapy [facility] for tasks assessed/performed             Past Medical History:  Diagnosis Date   Anxiety    Depression    Hypercholesteremia    Hypertension     Past Surgical History:  Procedure Laterality Date   cervical fusion     IR ANGIO INTRA EXTRACRAN SEL INTERNAL CAROTID BILAT MOD SED  01/02/2021   IR ANGIO VERTEBRAL SEL SUBCLAVIAN INNOMINATE UNI L MOD SED  01/02/2021   IR ANGIO VERTEBRAL SEL VERTEBRAL UNI R MOD SED  01/02/2021   IR CT HEAD LTD  01/02/2021   IR INTRA CRAN STENT  01/02/2021   IR NEURO EACH ADD'L AFTER BASIC UNI RIGHT (MS)  01/02/2021   RADIOLOGY WITH ANESTHESIA N/A 01/02/2021   Procedure: IR WITH ANESTHESIA;  Surgeon: Pedro Earls, MD;  Location: [place];  Service: Radiology;  Laterality: N/A;   SHOULDER SURGERY Left 1990   WRIST FRACTURE SURGERY      There were no vitals filed for this visit.   Subjective Assessment - 02/10/21 1352     Subjective Patient reports he has been experiencing some tightness with right hip and difficulty putting his sock on. 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 PMH includes HTN, hypercholestermia, depression, and anxiety. Pt presents to evaluation with balance and visual deficits, mild strength deficits, headaches, and posterior neck pain.    Limitations Standing;Walking;Lifting;House hold activities    How long can you sit comfortably? No limitations    How long can you stand comfortably? about an hour    How long can you walk comfortably? 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 No/denies             INTERVENTIONS:   Reassessed all remaining goals:  FOTO= 98 (improved from 60)  6  min walk test= 1770 feet without an AD and no LOB (improved from 1200 feet   Reviewed all HEP- Balance- Single leg stance, tandem standing/walking. Instructed patient in flexibility of right hip/LE- Knee to chest, Hip circles, hip flex stretch. Patient able to verbalize and demonstrate good understanding of current HEP.                           PT Education - 02/10/21 1408     Education Details ROM activities; Final reivew of balance HEP    Person(s) Educated Patient    Methods Explanation    Comprehension Verbalized understanding;Returned demonstration              PT Short Term Goals - 01/08/21 1226       PT SHORT TERM GOAL #1   Title Pt will be independent with HEP in order to improve  strength and balance in order to decrease fall risk and improve function at home and work.    Baseline 01/08/21: HEP initiated    Time 4    Period Weeks    Status New    Target Date 02/05/21               PT Long Term Goals - 02/10/21 1324       PT LONG TERM GOAL #1   Title Pt will improve FOTO score to >72% to show improved functional mobility and ease of ADLs.    Baseline 01/08/21: 60%; 02/10/2021= 98%    Time 8    Period Weeks    Status Achieved    Target Date 03/05/21      PT LONG TERM GOAL #2   Title Pt will improve FGA by at least 5 points in order to demonstrate clinically significant improvement in balance and decreased risk for falls.    Baseline 01/08/21: 19/30; 01/27/2021= 29/30    Time 8    Period Weeks    Status Achieved    Target Date 03/05/21      PT LONG TERM GOAL #3   Title Pt will increase 6MWT by at least 62m(1647f in order to demonstrate clinically significant improvement in cardiopulmonary endurance and community ambulation.    Baseline 01/08/21: 1200 ft; 02/10/2021= 1770 feet without an AD and no LOB    Time 8    Period Weeks    Status Achieved    Target Date 03/05/21                   Plan - 02/10/21 1408     Clinical Impression Statement Patient presents with some tightness noted with knee to chest but able to improve with stretching. Overall- He has made excellent progress - scoring a 98 on FOTO indicating a clinically significant improvement in self perceived ability. He met his balance goal last visit and continues to report no falls using no device with only very infrequent LOB at home. He demo much improved functional endurance/gait distance- improving his 6 min walk test by 570 today. He has met all goals at this time and has returned to his previous level of independence. No further PT services warranted at this time and patient agreeable to plan for discharge today.    Personal Factors and Comorbidities Age;Comorbidity 3+     Examination-Activity Limitations Stairs;Transfers;Squat;Stand;Lift;Carry;Locomotion Level;Caring for Others    Examination-Participation Restrictions Cleaning;Community Activity;Yard Work;Interpersonal Relationship;Volunteer;Driving;Laundry    Stability/Clinical Decision Making Stable/Uncomplicated    Rehab Potential Good  PT Frequency 2x / week    PT Duration 8 weeks    PT Treatment/Interventions ADLs/Self Care Home Management;Canalith Repostioning;Cryotherapy;Moist Heat;DME Instruction;Gait training;Stair training;Functional mobility training;Therapeutic activities;Therapeutic exercise;Balance training;Neuromuscular re-education;Patient/family education;Manual techniques;Vestibular;Visual/perceptual remediation/compensation;Joint Manipulations;Spinal Manipulations;Energy conservation;Dry needling;Passive range of motion;Splinting    PT Next Visit Plan Discharge to a home program    PT Home Exercise Plan Access Code: [email or web address]    Consulted and Agree with Plan of Care Patient             Patient will benefit from skilled therapeutic intervention in order to improve the following deficits and impairments:  Abnormal gait, Decreased activity tolerance, Decreased endurance, Decreased strength, Dizziness, Impaired perceived functional ability, Impaired UE functional use, Improper body mechanics, Pain, Decreased balance, Decreased mobility, Difficulty walking, Impaired vision/preception  Visit Diagnosis: Unsteadiness on feet  Abnormality of gait and mobility     Problem List Patient Active Problem List   Diagnosis Date Noted   Stroke (cerebrum) ([place]) 01/02/2021   Hypertension    Hypercholesteremia    Anxiety    BPH (benign prostatic hyperplasia)    GERD (gastroesophageal reflux disease)    CVA (cerebral vascular accident) ([place]) 12/31/2020   Vitamin D insufficiency 07/04/2014   Hypocalcemia 07/02/2014   Prediabetes 07/02/2014   AKI (acute kidney injury) ([place]) 07/02/2014   Other  pancytopenia ([place])    Hemoperitoneum    Thrombocytopenia ([place])    Splenomegaly 07/01/2014   Spleen injury 07/01/2014    Lewis Moccasin, PT 02/10/2021, 2:24 PM  [place] MAIN [facility] SERVICES [address] [place], [place], [address] Phone: [phone]   Fax:  [phone]  Name: Kevin Santiago MRN: [id number] Date of Birth: [birth year]

## 2021-02-12 ENCOUNTER — Ambulatory Visit: Payer: Medicare PPO

## 2021-02-17 ENCOUNTER — Ambulatory Visit: Payer: No Typology Code available for payment source

## 2021-02-19 ENCOUNTER — Ambulatory Visit: Payer: No Typology Code available for payment source

## 2021-02-23 ENCOUNTER — Inpatient Hospital Stay: Payer: Self-pay | Admitting: Neurology

## 2021-02-24 ENCOUNTER — Ambulatory Visit: Payer: No Typology Code available for payment source

## 2021-02-26 ENCOUNTER — Ambulatory Visit: Payer: No Typology Code available for payment source

## 2021-03-03 ENCOUNTER — Ambulatory Visit: Payer: No Typology Code available for payment source

## 2021-03-05 ENCOUNTER — Ambulatory Visit: Payer: No Typology Code available for payment source

## 2021-03-10 ENCOUNTER — Ambulatory Visit: Payer: No Typology Code available for payment source

## 2021-03-12 ENCOUNTER — Ambulatory Visit: Payer: No Typology Code available for payment source

## 2021-03-17 ENCOUNTER — Ambulatory Visit: Payer: No Typology Code available for payment source

## 2021-03-19 ENCOUNTER — Ambulatory Visit: Payer: No Typology Code available for payment source

## 2021-03-24 ENCOUNTER — Ambulatory Visit: Payer: No Typology Code available for payment source

## 2021-03-26 ENCOUNTER — Ambulatory Visit: Payer: No Typology Code available for payment source

## 2021-03-31 ENCOUNTER — Ambulatory Visit: Payer: No Typology Code available for payment source

## 2021-04-02 ENCOUNTER — Ambulatory Visit: Payer: No Typology Code available for payment source

## 2021-04-07 ENCOUNTER — Ambulatory Visit: Payer: No Typology Code available for payment source

## 2021-04-09 ENCOUNTER — Ambulatory Visit: Payer: No Typology Code available for payment source

## 2021-04-14 ENCOUNTER — Ambulatory Visit: Payer: No Typology Code available for payment source

## 2021-04-16 ENCOUNTER — Ambulatory Visit: Payer: No Typology Code available for payment source

## 2021-04-21 ENCOUNTER — Ambulatory Visit: Payer: No Typology Code available for payment source

## 2021-04-23 ENCOUNTER — Ambulatory Visit: Payer: No Typology Code available for payment source

## 2021-04-28 ENCOUNTER — Ambulatory Visit: Payer: No Typology Code available for payment source

## 2021-04-30 ENCOUNTER — Ambulatory Visit: Payer: No Typology Code available for payment source

## 2021-05-05 ENCOUNTER — Ambulatory Visit: Payer: No Typology Code available for payment source

## 2021-05-07 ENCOUNTER — Ambulatory Visit: Payer: No Typology Code available for payment source

## 2021-05-12 ENCOUNTER — Ambulatory Visit: Payer: No Typology Code available for payment source

## 2021-05-12 ENCOUNTER — Other Ambulatory Visit: Payer: Self-pay | Admitting: Neurology

## 2021-05-13 ENCOUNTER — Other Ambulatory Visit (HOSPITAL_COMMUNITY): Payer: Self-pay | Admitting: Neuroradiology

## 2021-05-13 DIAGNOSIS — I639 Cerebral infarction, unspecified: Secondary | ICD-10-CM

## 2021-05-14 ENCOUNTER — Emergency Department: Payer: No Typology Code available for payment source

## 2021-05-14 ENCOUNTER — Other Ambulatory Visit: Payer: Self-pay

## 2021-05-14 ENCOUNTER — Encounter: Payer: Self-pay | Admitting: Emergency Medicine

## 2021-05-14 ENCOUNTER — Ambulatory Visit: Payer: No Typology Code available for payment source

## 2021-05-14 ENCOUNTER — Emergency Department
Admission: EM | Admit: 2021-05-14 | Discharge: 2021-05-14 | Disposition: A | Discharge status: Home / Self Care | Payer: No Typology Code available for payment source | Attending: Emergency Medicine | Admitting: Emergency Medicine

## 2021-05-14 DIAGNOSIS — Z7982 Long term (current) use of aspirin: Secondary | ICD-10-CM | POA: Insufficient documentation

## 2021-05-14 DIAGNOSIS — S0990XA Unspecified injury of head, initial encounter: Secondary | ICD-10-CM | POA: Diagnosis not present

## 2021-05-14 DIAGNOSIS — S161XXA Strain of muscle, fascia and tendon at neck level, initial encounter: Secondary | ICD-10-CM | POA: Diagnosis not present

## 2021-05-14 DIAGNOSIS — S199XXA Unspecified injury of neck, initial encounter: Secondary | ICD-10-CM | POA: Diagnosis present

## 2021-05-14 DIAGNOSIS — Y92410 Unspecified street and highway as the place of occurrence of the external cause: Secondary | ICD-10-CM | POA: Diagnosis not present

## 2021-05-14 MED ORDER — CYCLOBENZAPRINE HCL 5 MG PO TABS
5.0000 mg | ORAL_TABLET | Freq: Three times a day (TID) | ORAL | 0 refills | Status: DC | PRN
Start: 2021-05-14 — End: 2022-05-06

## 2021-05-14 MED ORDER — ACETAMINOPHEN 500 MG PO TABS
1000.0000 mg | ORAL_TABLET | Freq: Once | ORAL | Status: AC
  Administered 2021-05-14: 1000 mg via ORAL
  Filled 2021-05-14: qty 2
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
## 2021-05-14 NOTE — Discharge Instructions (Signed)
Please take Tylenol as needed for mild to moderate pain.  At nighttime you may use Flexeril as needed for muscle tightness/tension.  Apply ice to the area of soreness and tightness 20 minutes every hour for the next several days.  Try to walk and stay active to prevent muscle tightness/spasms.  Return to the ER for any worsening symptoms or urgent changes in your health. ?

## 2021-05-15 ENCOUNTER — Other Ambulatory Visit: Payer: Self-pay | Admitting: Neurosurgery

## 2021-05-15 DIAGNOSIS — M79604 Pain in right leg: Secondary | ICD-10-CM

## 2021-05-19 ENCOUNTER — Ambulatory Visit: Payer: No Typology Code available for payment source

## 2021-05-21 ENCOUNTER — Ambulatory Visit: Payer: No Typology Code available for payment source

## 2021-05-24 ENCOUNTER — Ambulatory Visit
Admission: RE | Admit: 2021-05-24 | Discharge: 2021-05-24 | Disposition: A | Discharge status: Home / Self Care | Payer: No Typology Code available for payment source | Source: Ambulatory Visit | Attending: Neurosurgery | Admitting: Neurosurgery

## 2021-05-24 DIAGNOSIS — M79604 Pain in right leg: Secondary | ICD-10-CM

## 2021-05-26 ENCOUNTER — Ambulatory Visit: Payer: No Typology Code available for payment source

## 2021-07-06 ENCOUNTER — Other Ambulatory Visit: Payer: Self-pay | Admitting: Student

## 2021-07-06 DIAGNOSIS — I639 Cerebral infarction, unspecified: Secondary | ICD-10-CM

## 2021-07-07 ENCOUNTER — Other Ambulatory Visit (HOSPITAL_COMMUNITY): Payer: Self-pay | Admitting: Neuroradiology

## 2021-07-07 ENCOUNTER — Ambulatory Visit (HOSPITAL_COMMUNITY)
Admission: RE | Admit: 2021-07-07 | Discharge: 2021-07-07 | Disposition: A | Discharge status: Home / Self Care | Payer: No Typology Code available for payment source | Source: Ambulatory Visit | Attending: Neuroradiology | Admitting: Neuroradiology

## 2021-07-07 ENCOUNTER — Other Ambulatory Visit: Payer: Self-pay

## 2021-07-07 DIAGNOSIS — Z87891 Personal history of nicotine dependence: Secondary | ICD-10-CM | POA: Insufficient documentation

## 2021-07-07 DIAGNOSIS — I639 Cerebral infarction, unspecified: Secondary | ICD-10-CM

## 2021-07-07 DIAGNOSIS — I10 Essential (primary) hypertension: Secondary | ICD-10-CM | POA: Diagnosis not present

## 2021-07-07 HISTORY — PX: IR ANGIO VERTEBRAL SEL SUBCLAVIAN INNOMINATE UNI R MOD SED: IMG5365

## 2021-07-07 HISTORY — PX: IR US GUIDE VASC ACCESS RIGHT: IMG2390

## 2021-07-07 LAB — BASIC METABOLIC PANEL
Anion gap: 8 (ref 5–15)
BUN: 19 mg/dL (ref 8–23)
CO2: 23 mmol/L (ref 22–32)
Calcium: 9.3 mg/dL (ref 8.9–10.3)
Chloride: 109 mmol/L (ref 98–111)
Creatinine, Ser: 1.33 mg/dL — ABNORMAL HIGH (ref 0.61–1.24)
GFR, Estimated: 55 mL/min — ABNORMAL LOW (ref >60)
Glucose, Bld: 106 mg/dL — ABNORMAL HIGH (ref 70–99)
Potassium: 4.5 mmol/L (ref 3.5–5.1)
Sodium: 140 mmol/L (ref 135–145)

## 2021-07-07 LAB — CBC
HCT: 43.3 % (ref 39.0–52.0)
Hemoglobin: 14.4 g/dL (ref 13.0–17.0)
MCH: 27.8 pg (ref 26.0–34.0)
MCHC: 33.3 g/dL (ref 30.0–36.0)
MCV: 83.6 fL (ref 80.0–100.0)
Platelets: 82 10*3/uL — ABNORMAL LOW (ref 150–400)
RBC: 5.18 MIL/uL (ref 4.22–5.81)
RDW: 13.6 % (ref 11.5–15.5)
WBC: 4.6 10*3/uL (ref 4.0–10.5)
nRBC: 0 % (ref 0.0–0.2)

## 2021-07-07 LAB — PROTIME-INR
INR: 1.1 (ref 0.8–1.2)
Prothrombin Time: 13.7 seconds (ref 11.4–15.2)

## 2021-07-07 MED ORDER — FLUMAZENIL 0.5 MG/5ML IV SOLN
INTRAVENOUS | Status: AC
  Filled 2021-07-07: qty 5

## 2021-07-07 MED ORDER — ATROPINE SULFATE 1 MG/ML IV SOLN
INTRAVENOUS | Status: AC | PRN
  Administered 2021-07-07: 1 mg via INTRAVENOUS

## 2021-07-07 MED ORDER — IOHEXOL 300 MG/ML  SOLN
100.0000 mL | Freq: Once | INTRAMUSCULAR | Status: AC | PRN
Start: 2021-07-07 — End: 2021-07-07
  Administered 2021-07-07: 20 mL via INTRA_ARTERIAL

## 2021-07-07 MED ORDER — ASPIRIN 325 MG PO TBEC: 325.0000 mg | DELAYED_RELEASE_TABLET | Freq: Every day | ORAL | 3 refills | Status: DC

## 2021-07-07 MED ORDER — SODIUM CHLORIDE 0.9 % IV SOLN: INTRAVENOUS | Status: DC

## 2021-07-07 MED ORDER — MIDAZOLAM HCL 2 MG/2ML IJ SOLN
INTRAMUSCULAR | Status: AC
  Filled 2021-07-07: qty 2

## 2021-07-07 MED ORDER — NALOXONE HCL 0.4 MG/ML IJ SOLN
INTRAMUSCULAR | Status: AC
  Filled 2021-07-07: qty 1

## 2021-07-07 MED ORDER — VERAPAMIL HCL 2.5 MG/ML IV SOLN
INTRAVENOUS | Status: AC
  Filled 2021-07-07: qty 2

## 2021-07-07 MED ORDER — VANCOMYCIN HCL IN DEXTROSE 1-5 GM/200ML-% IV SOLN
1000.0000 mg | Freq: Once | INTRAVENOUS | Status: AC
Start: 2021-07-07 — End: 2021-07-07
  Administered 2021-07-07: 1000 mg via INTRAVENOUS

## 2021-07-07 MED ORDER — VANCOMYCIN HCL IN DEXTROSE 1-5 GM/200ML-% IV SOLN
INTRAVENOUS | Status: AC
  Filled 2021-07-07: qty 200

## 2021-07-07 MED ORDER — MIDAZOLAM HCL 2 MG/2ML IJ SOLN
INTRAMUSCULAR | Status: AC | PRN
  Administered 2021-07-07: 1 mg via INTRAVENOUS

## 2021-07-07 MED ORDER — FENTANYL CITRATE (PF) 100 MCG/2ML IJ SOLN
INTRAMUSCULAR | Status: AC | PRN
  Administered 2021-07-07: 25 ug via INTRAVENOUS

## 2021-07-07 MED ORDER — NITROGLYCERIN 1 MG/10 ML FOR IR/CATH LAB
INTRA_ARTERIAL | Status: AC
  Filled 2021-07-07: qty 10

## 2021-07-07 MED ORDER — NALOXONE HCL 0.4 MG/ML IJ SOLN
INTRAMUSCULAR | Status: AC | PRN
  Administered 2021-07-07: .4 mg via INTRAVENOUS

## 2021-07-07 MED ORDER — LIDOCAINE HCL 1 % IJ SOLN
INTRAMUSCULAR | Status: AC
  Filled 2021-07-07: qty 20

## 2021-07-07 MED ORDER — FENTANYL CITRATE (PF) 100 MCG/2ML IJ SOLN
INTRAMUSCULAR | Status: AC
  Filled 2021-07-07: qty 2

## 2021-07-07 MED ORDER — HEPARIN SODIUM (PORCINE) 1000 UNIT/ML IJ SOLN
INTRAMUSCULAR | Status: AC
  Filled 2021-07-07: qty 10

## 2021-07-07 MED ORDER — NITROGLYCERIN 1 MG/10 ML FOR IR/CATH LAB: INTRA_ARTERIAL | Status: AC | PRN

## 2021-07-07 MED ORDER — FLUMAZENIL 0.5 MG/5ML IV SOLN
INTRAVENOUS | Status: AC | PRN
  Administered 2021-07-07: .5 mg via INTRAVENOUS

## 2021-07-07 NOTE — Sedation Documentation (Signed)
Patient alert/oriented x 4, vital signs stable, TR band clean, dry, intact. Will continue to monitor.

## 2021-07-07 NOTE — Sedation Documentation (Incomplete)
After administration of intra-arterial radial cocktail, patient became severely hypotensive. Normal saline bolus infusing wide open. Rapid Response called for additional assistance.  Patient alert/oriented x 4 but stated that he felt "horrible", reversal drugs (Narcan & Romazicon) given. Patient became bradycardiac in 30s, patient connected to zoll and atropine given.
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
## 2021-07-07 NOTE — Sedation Documentation (Signed)
Right radial sheath removed, 8 cc of air applied to TR Band (snuff box).

## 2021-07-07 NOTE — Procedures (Addendum)
INTERVENTIONAL NEURORADIOLOGY BRIEF POSTPROCEDURE NOTE  DIAGNOSTIC CEREBRAL ANGIOGRAM  Attending: Dr. Baldemar Lenis  Assistant: None  Diagnosis: Vertebrobasilar insufficiency status post stenting   Access site: Distal right radial artery  Access closure: Inflatable band  Anesthesia: Moderate sedation  Medication used: 1 mg Versed IV; 25 mcg Fentanyl IV.  Complications: Episode of transient symptomatic bradycardia and hypotension after intra arterial injection of 300 mcg nitroglycerin (radial cocktail) with RR 35 and SBP in the 70s, resolved after 1 mg atropine, saline bolus and sedation reversal.  Estimated blood loss: None.  Specimen: None.  Findings: Widely patent intracranial right vertebral artery and basilar artery stents.  The patient was sent to recover in stable condition.   PLAN:  Patient may discontinue ASA 81 mg qd and Brilinta 90 mg bid and start ASA 325 mg qd. Follow-up imaging with MRA in 6 months.

## 2021-07-07 NOTE — Sedation Documentation (Signed)
Patient alert/oriented x 4, vital signs stable, TR band clean, dry, intact. Will continue to monitor.  

## 2021-07-07 NOTE — Sedation Documentation (Addendum)
After administration of radial cocktail, patient became hypotensive (74/48), HR 50s and symptomatic (patient alert/oriented x 4 but states he "feels horrible and needs to site up because he is about to vomit"). Normal saline bolus infusing wide open. Rapid response called. Reversal medications (Romazicon and Narcan) given per order. Patient became bradycardiac in 30s and with worsening hypotension (67/51), patient connected to zoll machine and 1mg  of atropine given, bolus still infusing.

## 2021-07-07 NOTE — Progress Notes (Signed)
Dr Tommie Sams in and ok to d/c home

## 2021-07-07 NOTE — Sedation Documentation (Signed)
Patient brought to nurses station after procedure for recovery and monitoring. Patient alert/oriented x 4, vital signs stable, TR band clean, dry, intact. Will continue to monitor.

## 2021-10-14 ENCOUNTER — Other Ambulatory Visit (HOSPITAL_COMMUNITY): Payer: Self-pay | Admitting: Internal Medicine

## 2021-10-14 ENCOUNTER — Other Ambulatory Visit: Payer: Self-pay | Admitting: *Deleted

## 2021-10-14 ENCOUNTER — Other Ambulatory Visit: Payer: Self-pay | Admitting: Internal Medicine

## 2021-10-14 DIAGNOSIS — R1319 Other dysphagia: Secondary | ICD-10-CM

## 2021-10-19 ENCOUNTER — Ambulatory Visit (HOSPITAL_COMMUNITY)
Admission: RE | Admit: 2021-10-19 | Discharge: 2021-10-19 | Disposition: A | Discharge status: Home / Self Care | Payer: No Typology Code available for payment source | Source: Ambulatory Visit | Attending: Internal Medicine | Admitting: Internal Medicine

## 2021-10-19 ENCOUNTER — Other Ambulatory Visit (HOSPITAL_COMMUNITY): Payer: Self-pay | Admitting: Internal Medicine

## 2021-10-19 DIAGNOSIS — R1319 Other dysphagia: Secondary | ICD-10-CM

## 2022-03-12 ENCOUNTER — Other Ambulatory Visit (HOSPITAL_COMMUNITY): Payer: Self-pay | Admitting: Neuroradiology

## 2022-03-12 ENCOUNTER — Telehealth (HOSPITAL_COMMUNITY): Payer: Self-pay | Admitting: Radiology

## 2022-03-12 ENCOUNTER — Encounter (HOSPITAL_COMMUNITY): Payer: Self-pay | Admitting: Neuroradiology

## 2022-03-12 DIAGNOSIS — I639 Cerebral infarction, unspecified: Secondary | ICD-10-CM

## 2022-03-12 NOTE — Telephone Encounter (Signed)
Patient called to ask when he is due for follow-up with Dr. Pedro Earls. The patient was due for follow-up in December of 2023, however we were unable to obtain an authorization at that time. The patient is continuing to have concerning symptoms post-stroke. He complains of dizziness, gait disturbance, a squiggly line across his right eye at times, blurred vision in both eyes at times, a headache every night for about 2 hours that does away without medication, and stumbling at least once a day. After discussion of these symptoms with Dr. Pedro Earls she is requesting an urgent MRA head without contrast and an office visit to follow that scan. This has been scheduled for 03/16/22 at [facility]. The patient was also informed that should he feel these symptoms are increasing or worsening he should go to the ED or call 911 immediately. The patient agrees with this plan of care. JM

## 2022-03-16 ENCOUNTER — Ambulatory Visit (HOSPITAL_COMMUNITY)
Admission: RE | Admit: 2022-03-16 | Discharge: 2022-03-16 | Disposition: A | Discharge status: Home / Self Care | Payer: No Typology Code available for payment source | Source: Ambulatory Visit | Attending: Neuroradiology | Admitting: Neuroradiology

## 2022-03-16 ENCOUNTER — Other Ambulatory Visit (HOSPITAL_COMMUNITY): Payer: Self-pay | Admitting: Neuroradiology

## 2022-03-16 DIAGNOSIS — I639 Cerebral infarction, unspecified: Secondary | ICD-10-CM

## 2022-03-16 MED ORDER — TICAGRELOR 90 MG PO TABS: 90.0000 mg | ORAL_TABLET | Freq: Two times a day (BID) | ORAL | 2 refills | Status: DC

## 2022-03-16 MED ORDER — ASPIRIN 81 MG PO TBEC: 81.0000 mg | DELAYED_RELEASE_TABLET | Freq: Every day | ORAL | 12 refills | Status: DC

## 2022-03-16 NOTE — Progress Notes (Signed)
Referring Physician(s): de Macedo Clairton  Chief Complaint: Recurrent dizziness, gait disturbance, and seeing squiggly line.   History of present illness:  Kevin Santiago is a 82 year old male with past medical history significant for hypertension, hypercholesterolemia, anxiety and depression.  He presented to the ED on 12/31/20 with left-sided numbness, imbalance and squiggly lines in his right eye.  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  He underwent a diagnostic cerebral angiogram with intracranial angioplasty/stenting at the basilar and intracranial right vertebral arteries with complete resolution of stenosis.  He was discharged on aspirin and Brilinta.  On 07/07/2021, he underwent a follow-up diagnostic cerebral angiogram that showed widely patent intracranial stent.  At this point, he discontinued the Brilinta and increased a aspirin to 325 mg.    He head been doing well until you a few weeks ago when he started feeling that his gait was slightly imbalanced and dizzy, and again seeing squiggly lines in his right eye.  He says symptoms are stable.  He has an appointment scheduled with a neurologist on Friday March 19, 2022.  He underwent an MR angiogram earlier today that showed patent intracranial stents with possible mild stenosis of the right vertebral artery stent.  He denies new focal weakness, numbness, or episodes of aphasia and dysarthria. He says he has not been able to drink fluids adequately due to increased frequency of urination when he he drinks enough water, sometimes having to wake up at night more than 10 times to use the bathroom. He informed me he already has an urologist appointment scheduled for next month.  Past Medical History:  Diagnosis Date   Anxiety    Depression    Hypercholesteremia    Hypertension     Past Surgical History:  Procedure  Laterality Date   cervical fusion     IR ANGIO INTRA EXTRACRAN SEL INTERNAL CAROTID BILAT MOD SED  01/02/2021   IR ANGIO VERTEBRAL SEL SUBCLAVIAN INNOMINATE UNI L MOD SED  01/02/2021   IR ANGIO VERTEBRAL SEL SUBCLAVIAN INNOMINATE UNI R MOD SED  07/07/2021   IR ANGIO VERTEBRAL SEL VERTEBRAL UNI R MOD SED  01/02/2021   IR CT HEAD LTD  01/02/2021   IR INTRA CRAN STENT  01/02/2021   IR NEURO EACH ADD'L AFTER BASIC UNI RIGHT (MS)  01/02/2021   IR US GUIDE VASC ACCESS RIGHT  07/07/2021   RADIOLOGY WITH ANESTHESIA N/A 01/02/2021   Procedure: IR WITH ANESTHESIA;  Surgeon: Pedro Earls, MD;  Location: [facility];  Service: Radiology;  Laterality: N/A;   SHOULDER SURGERY Left 1990   WRIST FRACTURE SURGERY      Allergies: Alendronate sodium, Nitroglycerin, Simvastatin, Other, and Penicillins  Medications: Prior to Admission medications   Medication Sig Start Date End Date Taking? Authorizing Provider  acetaminophen (TYLENOL) 500 MG tablet Take 1,000 mg by mouth every 6 (six) hours as needed for moderate pain.    [provider]  aspirin EC 81 MG tablet Take 1 tablet (81 mg total) by mouth daily. Swallow whole. 03/16/22   de Rosario Jacks, MD  Cholecalciferol (VITAMIN D) 50 MCG (2000 UT) tablet Take 4,000 Units by mouth daily.    [provider]  cyclobenzaprine (FLEXERIL) 5 MG tablet Take 1 tablet (5 mg total) by mouth 3 (three) times daily as needed for muscle spasms. Patient not taking: Reported on 07/03/2021 05/14/21  Duanne Guess, PA-C  ferrous sulfate 325 (65 FE) MG tablet Take 325 mg by mouth daily as needed (energy). 02/29/20   [provider]  fluorouracil (EFUDEX) 5 % cream Apply 1 application topically daily as needed. Skin basil cell areas as directed 07/18/20   [provider]  hydrocortisone (ANUSOL-HC) 2.5 % rectal cream Place 1 application rectally 2 (two) times daily. Patient taking differently: Place 1 application. rectally 2 (two)  times daily as needed for itching. 06/30/13   Bjorn Pippin, PA-C  hydroxypropyl methylcellulose (ISOPTO TEARS) 2.5 % ophthalmic solution Place 2 drops into both eyes 2 (two) times daily as needed for dry eyes.    [provider]  losartan (COZAAR) 50 MG tablet Take 50 mg by mouth daily.    [provider]  omeprazole (PRILOSEC) 20 MG capsule Take 20 mg by mouth daily as needed (acid reflux).    [provider]  pravastatin (PRAVACHOL) 80 MG tablet Take 80 mg by mouth daily.    [provider]  sertraline (ZOLOFT) 100 MG tablet Take 100 mg by mouth 2 (two) times daily.    [provider]  tamsulosin (FLOMAX) 0.4 MG CAPS capsule Take 0.4 mg by mouth at bedtime.    [provider]  terbinafine (LAMISIL) 1 % cream Apply 1 application. topically 2 (two) times daily as needed (itching).    [provider]  ticagrelor (BRILINTA) 90 MG TABS tablet Take 1 tablet (90 mg total) by mouth 2 (two) times daily. 03/16/22 06/14/22  de Rosario Jacks, MD     Family History  Problem Relation Age of Onset   Kidney disease Mother    Heart attack Father    Cancer Sister    Cancer Brother    Cancer Sister     Social History   Socioeconomic History   Marital status: Single    Spouse name: Not on file   Number of children: Not on file   Years of education: Not on file   Highest education level: Not on file  Occupational History   Not on file  Tobacco Use   Smoking status: Former   Smokeless tobacco: Never   Tobacco comments:    Quit smoking in 1970's; up until that time, 1 pack would last him 1 month.   Substance and Sexual Activity   Alcohol use: Not Currently   Drug use: No   Sexual activity: Not on file  Other Topics Concern   Not on file  Social History Narrative   Not on file   Social Determinants of Health   Financial Resource Strain: Not on file  Food Insecurity: Not on file  Transportation Needs: Not on file   Physical Activity: Not on file  Stress: Not on file  Social Connections: Not on file     Vital Signs: There were no vitals taken for this visit.  Physical Exam Constitutional:      Appearance: Normal appearance.  HENT:     Head: Normocephalic and atraumatic.     Mouth/Throat:     Mouth: Mucous membranes are moist.     Pharynx: Oropharynx is clear.  Eyes:     Extraocular Movements: Extraocular movements intact.     Conjunctiva/sclera: Conjunctivae normal.     Pupils: Pupils are equal, round, and reactive to light.  Pulmonary:     Effort: Pulmonary effort is normal.  Neurological:     Mental Status: He is alert and oriented to person, place, and  time.     Cranial Nerves: Cranial nerves 2-12 are intact.     Sensory: Sensory deficit present.     Motor: Weakness present.     Comments: Patient refers mild decreased sensation in the lateral aspect of his left leg. Mild left leg weakness that patient attributes to hip pain.     Imaging: No results found.  Labs:  CBC: Recent Labs    07/07/21 0706  WBC 4.6  HGB 14.4  HCT 43.3  PLT 82*    COAGS: Recent Labs    07/07/21 0750  INR 1.1    BMP: Recent Labs    07/07/21 0706  NA 140  K 4.5  CL 109  CO2 23  GLUCOSE 106*  BUN 19  CALCIUM 9.3  CREATININE 1.33*  GFRNONAA 55*    LIVER FUNCTION TESTS: No results for input(s): "BILITOT", "AST", "ALT", "ALKPHOS", "PROT", "ALBUMIN" in the last 8760 hours.  Assessment:  [name] comes here today with complaints of intermittently seeing squiggly lines in his right eye, slight imbalanced gait  and dizziness in the setting of possible dehydration given reported inadequate fluid intake.  Symptoms have been stable for a few weeks.  His MRA showed patent intracranial stents with possible mild stenosis of the right vertebral artery stent.  However, precise degree of stenosis difficult to characterize.  I will discontinue his aspirin 325 mg in favor of dual  anti-platelet therapy with Brilinta 90 mg b.i.d. and aspirin 81 mg q.d.  I informed him that if symptoms persist or progress, we may need to do a cerebral angiogram with possible angioplasty.   Signed: Pedro Earls, MD 03/16/2022, 1:53 PM    I spent a total of  40 Minutes  in face to face in clinical consultation, greater than 50% of which was counseling/coordinating care for vertebrobasilar insufficiency.

## 2022-03-19 ENCOUNTER — Ambulatory Visit: Payer: Self-pay | Admitting: Neurology

## 2022-03-19 ENCOUNTER — Ambulatory Visit (INDEPENDENT_AMBULATORY_CARE_PROVIDER_SITE_OTHER): Payer: Medicare Other | Admitting: Neurology

## 2022-03-19 DIAGNOSIS — Z91199 Patient's noncompliance with other medical treatment and regimen due to unspecified reason: Secondary | ICD-10-CM

## 2022-03-19 NOTE — Progress Notes (Deleted)
[facility]    Provider:  Dr Jaynee Eagles Requesting Provider: No ref. provider found Primary Care Provider:  Patient, No Pcp Per  CC:  ***  HPI:  Kevin Santiago is a 82 y.o. male here as requested by No ref. provider found for strokelike symptoms.  Patient has a past medical history of stroke 1 year ago, hypertension, acute kidney injury, thrombocytopenia and pancytopenia, prediabetes, hypercholesterolemia, splenomegaly, anxiety, 4 level ACDF.  I reviewed Dr. 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  He underwent diagnostic cerebral angiogram with intracranial stenting for treatment of posterior circulation symptomatic hypoperfusion he was seen again 07/07/2021 for diagnostic cerebral angiogram which showed 2 stents 1 in the V4 segment of the right vertebral artery and proximal to mid basilar artery and both stents were widely open without evidence of significant hyperplasia are not then stenosis.  Atherosclerotic changes were noted of the right vertebral artery proximally and distal to the stent without hemodynamically significant stenosis.  Impression was previously placed intracranial right vertebral artery and basilar artery stent were widely patent, he was discontinued Brilinta, aspirin was increased from 81-3 25, follow-up MR angiogram showed the same.  Appears he had a cerebral angiogram 03/16/2022, the cerebral angiogram was completed for "continued symptoms of gait disturbance, headaches, blurred vision, lying in right eye), exam showed mild decreased sensation lateral aspect of left leg and mild left weakness chronic attroibutes to hip pain. He was dehydrated. He was changed to brilinta 90bid and asa 62m. I  don't see the results yet of this most recent cerebral angiogram in the chart by Dr. dPedro Earls MD   echocardiogram was normal 60 to 65%.  LDL was 128.  He was not on antithrombotic prior to admission he was started on 81 mg of aspirin daily and ongoing and Brilinta 90 mg twice daily loading.  He had a stent placed and follow-up MRA on March 16, 2022 showed a patent right vertebral artery and basilar artery stents without large vessel occlusion or high-grade stenosis.  Reviewed notes, labs and imaging from outside physicians, which showed ***  Cerebral angiogram 07/2021: IMPRESSION: Previously placed intracranial right vertebral artery and basilar artery stent are widely patent. Other findings as above.   PLAN: 1. Patient may discontinue the Brilinta. Aspirin dosage will be increased from 865mto 325 mg q.d. 2. Follow-up MR angiogram in 6 months.  FINDINGS: MRA head 03/16/2022 Anterior circulation: Intracranial ICAs are patent without stenosis or aneurysm. The proximal ACAs and MCAs are patent without stenosis or aneurysm. Distal branches are symmetric.   Posterior circulation: Patent right vertebral artery stent. Patent basilar artery stent. The SCAs, AICAs and PICAs are patent proximally. The PCAs are patent proximally without stenosis or aneurysm. Distal branches are symmetric.   Anatomic variants: Persistent fetal origin of the right PCA with hypoplastic right P1 segment.   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   IMPRESSION: 1. Patent right vertebral artery and basilar artery stents. 2. No large vessel occlusion or high-grade stenosis.    Review of Systems: Patient complains of symptoms per HPI as well as the following symptoms ***. Pertinent negatives and positives per HPI. All others negative.  Social History   Socioeconomic History   Marital status: Single    Spouse name: Not on file    Number of children: Not on file   Years of education: Not on file   Highest education level: Not on file  Occupational History   Not on file  Tobacco Use   Smoking status: Former   Smokeless tobacco: Never   Tobacco comments:    Quit smoking in 1970's; up until that time, 1 pack would last him 1 month.   Substance and Sexual Activity   Alcohol use: Not Currently   Drug use: No   Sexual activity: Not on file  Other Topics Concern   Not on file  Social History Narrative   Not on file   Social Determinants of Health   Financial Resource Strain: Not on file  Food Insecurity: Not on file  Transportation Needs: Not on file  Physical Activity: Not on file  Stress: Not on file  Social Connections: Not on file  Intimate Partner Violence: Not on file    Family History  Problem Relation Age of Onset   Kidney disease Mother    Heart attack Father    Cancer Sister    Cancer Brother    Cancer Sister     Past Medical History:  Diagnosis Date   Anxiety    Depression    Hypercholesteremia    Hypertension     Patient Active Problem List   Diagnosis Date Noted   Stroke (cerebrum) ([place]) 01/02/2021   Hypertension    Hypercholesteremia    Anxiety    BPH (benign prostatic hyperplasia)    GERD (gastroesophageal reflux disease)    CVA (cerebral vascular accident) ([place]) 12/31/2020   Vitamin D insufficiency 07/04/2014   Hypocalcemia 07/02/2014   Prediabetes 07/02/2014   AKI (acute kidney injury) ([place]) 07/02/2014   Other pancytopenia ([place])    Hemoperitoneum    Thrombocytopenia ([place])    Splenomegaly 07/01/2014   Spleen injury 07/01/2014    Past Surgical History:  Procedure Laterality Date   cervical fusion     IR ANGIO INTRA EXTRACRAN SEL INTERNAL CAROTID BILAT MOD SED  01/02/2021   IR ANGIO VERTEBRAL SEL SUBCLAVIAN INNOMINATE UNI L MOD SED  01/02/2021   IR ANGIO VERTEBRAL SEL SUBCLAVIAN INNOMINATE UNI R MOD SED  07/07/2021   IR ANGIO VERTEBRAL SEL VERTEBRAL UNI R MOD SED   01/02/2021   IR CT HEAD LTD  01/02/2021   IR INTRA CRAN STENT  01/02/2021   IR NEURO EACH ADD'L AFTER BASIC UNI RIGHT (MS)  01/02/2021   IR US GUIDE VASC ACCESS RIGHT  07/07/2021   RADIOLOGY WITH ANESTHESIA N/A 01/02/2021   Procedure: IR WITH ANESTHESIA;  Surgeon: Pedro Earls, MD;  Location: [place];  Service: Radiology;  Laterality: N/A;   SHOULDER SURGERY Left 1990   WRIST FRACTURE SURGERY      Current Outpatient Medications  Medication Sig Dispense Refill   acetaminophen (TYLENOL) 500 MG tablet Take 1,000 mg by mouth every 6 (six) hours as needed for moderate pain.     aspirin EC 81 MG tablet Take 1 tablet (81 mg total) by mouth daily. Swallow whole. 30 tablet 12   Cholecalciferol (VITAMIN D) 50 MCG (2000 UT) tablet Take 4,000 Units by mouth daily.     cyclobenzaprine (FLEXERIL) 5 MG tablet Take 1 tablet (5 mg total) by mouth 3 (three) times daily as needed for muscle spasms. (Patient not taking: Reported on 07/03/2021) 20 tablet 0  ferrous sulfate 325 (65 FE) MG tablet Take 325 mg by mouth daily as needed (energy).     fluorouracil (EFUDEX) 5 % cream Apply 1 application topically daily as needed. Skin basil cell areas as directed     hydrocortisone (ANUSOL-HC) 2.5 % rectal cream Place 1 application rectally 2 (two) times daily. (Patient taking differently: Place 1 application. rectally 2 (two) times daily as needed for itching.) 30 g 0   hydroxypropyl methylcellulose (ISOPTO TEARS) 2.5 % ophthalmic solution Place 2 drops into both eyes 2 (two) times daily as needed for dry eyes.     losartan (COZAAR) 50 MG tablet Take 50 mg by mouth daily.     omeprazole (PRILOSEC) 20 MG capsule Take 20 mg by mouth daily as needed (acid reflux).     pravastatin (PRAVACHOL) 80 MG tablet Take 80 mg by mouth daily.     sertraline (ZOLOFT) 100 MG tablet Take 100 mg by mouth 2 (two) times daily.     tamsulosin (FLOMAX) 0.4 MG CAPS capsule Take 0.4 mg by mouth at bedtime.     terbinafine (LAMISIL) 1 %  cream Apply 1 application. topically 2 (two) times daily as needed (itching).     ticagrelor (BRILINTA) 90 MG TABS tablet Take 1 tablet (90 mg total) by mouth 2 (two) times daily. 60 tablet 2   No current facility-administered medications for this visit.    Allergies as of 03/19/2022 - Review Complete 10/19/2021  Allergen Reaction Noted   Alendronate sodium  08/04/2011   Nitroglycerin Other (See Comments) 07/07/2021   Simvastatin  09/04/2004   Other Rash 01/02/2021   Penicillins Itching and Rash 06/26/2013    Vitals: There were no vitals taken for this visit. Last Weight:  Wt Readings from Last 1 Encounters:  07/07/21 180 lb (81.6 kg)   Last Height:   Ht Readings from Last 1 Encounters:  07/07/21 5' 10"$  (1.778 m)     Physical exam: Exam: Gen: NAD, conversant, well nourised, obese, well groomed                     CV: RRR, no MRG. No Carotid Bruits. No peripheral edema, warm, nontender Eyes: Conjunctivae clear without exudates or hemorrhage  Neuro: Detailed Neurologic Exam  Speech:    Speech is normal; fluent and spontaneous with normal comprehension.  Cognition:    The patient is oriented to person, place, and time;     recent and remote memory intact;     language fluent;     normal attention, concentration,     fund of knowledge Cranial Nerves:    The pupils are equal, round, and reactive to light. The fundi are normal and spontaneous venous pulsations are present. Visual fields are full to finger confrontation. Extraocular movements are intact. Trigeminal sensation is intact and the muscles of mastication are normal. The face is symmetric. The palate elevates in the midline. Hearing intact. Voice is normal. Shoulder shrug is normal. The tongue has normal motion without fasciculations.   Coordination:    Normal finger to nose and heel to shin. Normal rapid alternating movements.   Gait:    Heel-toe and tandem gait are normal.   Motor Observation:    No  asymmetry, no atrophy, and no involuntary movements noted. Tone:    Normal muscle tone.    Posture:    Posture is normal. normal erect    Strength:    Strength is V/V in the upper and lower limbs.  Sensation: intact to LT     Reflex Exam:  DTR's:    Deep tendon reflexes in the upper and lower extremities are normal bilaterally.   Toes:    The toes are downgoing bilaterally.   Clonus:    Clonus is absent.    Assessment/Plan:     I reviewed Dr. 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  Echocardiogram was normal 60 to 65%.  LDL was 128.  He was not on antithrombotic prior to admission he was started on 81 mg of aspirin daily and ongoing and Brilinta 90 mg twice daily loading.  At the time he was advised against low blood pressure, Lipitor 80 mg was started.  He had of vertebrobasilar stent and follow-up MRA on 03/16/2022  No orders of the defined types were placed in this encounter.  No orders of the defined types were placed in this encounter.   Cc: No ref. provider found,  Patient, No Pcp Per  Sarina Ill, MD  [facility] [address] [place], [place] [address]  Phone [phone] Fax [phone]

## 2022-03-19 NOTE — Progress Notes (Unsigned)
[facility]    Provider:  Dr Jaynee Eagles Requesting Provider: No ref. provider found Primary Care Provider:  Patient, No Pcp Per  CC:  ***  HPI:  Kevin Santiago is a 82 y.o. male here as requested by No ref. provider found for strokelike symptoms.  Patient has a past medical history of stroke 1 year ago, hypertension, acute kidney injury, thrombocytopenia and pancytopenia, prediabetes, hypercholesterolemia, splenomegaly, anxiety, 4 level ACDF.  I reviewed Dr. 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  He underwent diagnostic cerebral angiogram with intracranial stenting for treatment of posterior circulation symptomatic hypoperfusion he was seen again 07/07/2021 for diagnostic cerebral angiogram which showed 2 stents 1 in the V4 segment of the right vertebral artery and proximal to mid basilar artery and both stents were widely open without evidence of significant hyperplasia are not then stenosis.  Atherosclerotic changes were noted of the right vertebral artery proximally and distal to the stent without hemodynamically significant stenosis.  Impression was previously placed intracranial right vertebral artery and basilar artery stent were widely patent, he was discontinued Brilinta, aspirin was increased from 81-3 25, follow-up MR angiogram showed the same.  Appears he had a cerebral angiogram 03/16/2022, the cerebral angiogram was completed for "continued symptoms of gait disturbance, headaches, blurred vision, lying in right eye), exam showed mild decreased sensation lateral aspect of left leg and mild left weakness chronic attroibutes to hip pain. He was dehydrated. He was changed to brilinta 90bid and asa 61m. I  don't see the results yet of this most recent cerebral angiogram in the chart by Dr. dPedro Earls MD   echocardiogram was normal 60 to 65%.  LDL was 128.  He was not on antithrombotic prior to admission he was started on 81 mg of aspirin daily and ongoing and Brilinta 90 mg twice daily loading.  He had a stent placed and follow-up MRA on March 16, 2022 showed a patent right vertebral artery and basilar artery stents without large vessel occlusion or high-grade stenosis.  Reviewed notes, labs and imaging from outside physicians, which showed ***  Cerebral angiogram 07/2021: IMPRESSION: Previously placed intracranial right vertebral artery and basilar artery stent are widely patent. Other findings as above.   PLAN: 1. Patient may discontinue the Brilinta. Aspirin dosage will be increased from 832mto 325 mg q.d. 2. Follow-up MR angiogram in 6 months.  FINDINGS: MRA head 03/16/2022 Anterior circulation: Intracranial ICAs are patent without stenosis or aneurysm. The proximal ACAs and MCAs are patent without stenosis or aneurysm. Distal branches are symmetric.   Posterior circulation: Patent right vertebral artery stent. Patent basilar artery stent. The SCAs, AICAs and PICAs are patent proximally. The PCAs are patent proximally without stenosis or aneurysm. Distal branches are symmetric.   Anatomic variants: Persistent fetal origin of the right PCA with hypoplastic right P1 segment.   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   IMPRESSION: 1. Patent right vertebral artery and basilar artery stents. 2. No large vessel occlusion or high-grade stenosis.    Review of Systems: Patient complains of symptoms per HPI as well as the following symptoms ***. Pertinent negatives and positives per HPI. All others negative.  Social History   Socioeconomic History   Marital status: Single    Spouse name: Not on file    Number of children: Not on file   Years of education: Not on file   Highest education level: Not on file  Occupational History   Not on file  Tobacco Use   Smoking status: Former   Smokeless tobacco: Never   Tobacco comments:    Quit smoking in 1970's; up until that time, 1 pack would last him 1 month.   Substance and Sexual Activity   Alcohol use: Not Currently   Drug use: No   Sexual activity: Not on file  Other Topics Concern   Not on file  Social History Narrative   Not on file   Social Determinants of Health   Financial Resource Strain: Not on file  Food Insecurity: Not on file  Transportation Needs: Not on file  Physical Activity: Not on file  Stress: Not on file  Social Connections: Not on file  Intimate Partner Violence: Not on file    Family History  Problem Relation Age of Onset   Kidney disease Mother    Heart attack Father    Cancer Sister    Cancer Brother    Cancer Sister     Past Medical History:  Diagnosis Date   Anxiety    Depression    Hypercholesteremia    Hypertension     Patient Active Problem List   Diagnosis Date Noted   Stroke (cerebrum) ([place]) 01/02/2021   Hypertension    Hypercholesteremia    Anxiety    BPH (benign prostatic hyperplasia)    GERD (gastroesophageal reflux disease)    CVA (cerebral vascular accident) ([place]) 12/31/2020   Vitamin D insufficiency 07/04/2014   Hypocalcemia 07/02/2014   Prediabetes 07/02/2014   AKI (acute kidney injury) ([place]) 07/02/2014   Other pancytopenia ([employer])    Hemoperitoneum    Thrombocytopenia ([place])    Splenomegaly 07/01/2014   Spleen injury 07/01/2014    Past Surgical History:  Procedure Laterality Date   cervical fusion     IR ANGIO INTRA EXTRACRAN SEL INTERNAL CAROTID BILAT MOD SED  01/02/2021   IR ANGIO VERTEBRAL SEL SUBCLAVIAN INNOMINATE UNI L MOD SED  01/02/2021   IR ANGIO VERTEBRAL SEL SUBCLAVIAN INNOMINATE UNI R MOD SED  07/07/2021   IR ANGIO VERTEBRAL SEL VERTEBRAL UNI R MOD SED   01/02/2021   IR CT HEAD LTD  01/02/2021   IR INTRA CRAN STENT  01/02/2021   IR NEURO EACH ADD'L AFTER BASIC UNI RIGHT (MS)  01/02/2021   IR US GUIDE VASC ACCESS RIGHT  07/07/2021   RADIOLOGY WITH ANESTHESIA N/A 01/02/2021   Procedure: IR WITH ANESTHESIA;  Surgeon: Pedro Earls, MD;  Location: [place];  Service: Radiology;  Laterality: N/A;   SHOULDER SURGERY Left 1990   WRIST FRACTURE SURGERY      Current Outpatient Medications  Medication Sig Dispense Refill   acetaminophen (TYLENOL) 500 MG tablet Take 1,000 mg by mouth every 6 (six) hours as needed for moderate pain.     aspirin EC 81 MG tablet Take 1 tablet (81 mg total) by mouth daily. Swallow whole. 30 tablet 12   Cholecalciferol (VITAMIN D) 50 MCG (2000 UT) tablet Take 4,000 Units by mouth daily.     cyclobenzaprine (FLEXERIL) 5 MG tablet Take 1 tablet (5 mg total) by mouth 3 (three) times daily as needed for muscle spasms. (Patient not taking: Reported on 07/03/2021) 20 tablet 0  ferrous sulfate 325 (65 FE) MG tablet Take 325 mg by mouth daily as needed (energy).     fluorouracil (EFUDEX) 5 % cream Apply 1 application topically daily as needed. Skin basil cell areas as directed     hydrocortisone (ANUSOL-HC) 2.5 % rectal cream Place 1 application rectally 2 (two) times daily. (Patient taking differently: Place 1 application. rectally 2 (two) times daily as needed for itching.) 30 g 0   hydroxypropyl methylcellulose (ISOPTO TEARS) 2.5 % ophthalmic solution Place 2 drops into both eyes 2 (two) times daily as needed for dry eyes.     losartan (COZAAR) 50 MG tablet Take 50 mg by mouth daily.     omeprazole (PRILOSEC) 20 MG capsule Take 20 mg by mouth daily as needed (acid reflux).     pravastatin (PRAVACHOL) 80 MG tablet Take 80 mg by mouth daily.     sertraline (ZOLOFT) 100 MG tablet Take 100 mg by mouth 2 (two) times daily.     tamsulosin (FLOMAX) 0.4 MG CAPS capsule Take 0.4 mg by mouth at bedtime.     terbinafine (LAMISIL) 1 %  cream Apply 1 application. topically 2 (two) times daily as needed (itching).     ticagrelor (BRILINTA) 90 MG TABS tablet Take 1 tablet (90 mg total) by mouth 2 (two) times daily. 60 tablet 2   No current facility-administered medications for this visit.    Allergies as of 03/19/2022 - Review Complete 10/19/2021  Allergen Reaction Noted   Alendronate sodium  08/04/2011   Nitroglycerin Other (See Comments) 07/07/2021   Simvastatin  09/04/2004   Other Rash 01/02/2021   Penicillins Itching and Rash 06/26/2013    Vitals: There were no vitals taken for this visit. Last Weight:  Wt Readings from Last 1 Encounters:  07/07/21 180 lb (81.6 kg)   Last Height:   Ht Readings from Last 1 Encounters:  07/07/21 5' 10"$  (1.778 m)     Physical exam: Exam: Gen: NAD, conversant, well nourised, obese, well groomed                     CV: RRR, no MRG. No Carotid Bruits. No peripheral edema, warm, nontender Eyes: Conjunctivae clear without exudates or hemorrhage  Neuro: Detailed Neurologic Exam  Speech:    Speech is normal; fluent and spontaneous with normal comprehension.  Cognition:    The patient is oriented to person, place, and time;     recent and remote memory intact;     language fluent;     normal attention, concentration,     fund of knowledge Cranial Nerves:    The pupils are equal, round, and reactive to light. The fundi are normal and spontaneous venous pulsations are present. Visual fields are full to finger confrontation. Extraocular movements are intact. Trigeminal sensation is intact and the muscles of mastication are normal. The face is symmetric. The palate elevates in the midline. Hearing intact. Voice is normal. Shoulder shrug is normal. The tongue has normal motion without fasciculations.   Coordination:    Normal finger to nose and heel to shin. Normal rapid alternating movements.   Gait:    Heel-toe and tandem gait are normal.   Motor Observation:    No  asymmetry, no atrophy, and no involuntary movements noted. Tone:    Normal muscle tone.    Posture:    Posture is normal. normal erect    Strength:    Strength is V/V in the upper and lower limbs.  Sensation: intact to LT     Reflex Exam:  DTR's:    Deep tendon reflexes in the upper and lower extremities are normal bilaterally.   Toes:    The toes are downgoing bilaterally.   Clonus:    Clonus is absent.    Assessment/Plan:     I reviewed Dr. 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  Echocardiogram was normal 60 to 65%.  LDL was 128.  He was not on antithrombotic prior to admission he was started on 81 mg of aspirin daily and ongoing and Brilinta 90 mg twice daily loading.  At the time he was advised against low blood pressure, Lipitor 80 mg was started.  He had of vertebrobasilar stent and follow-up MRA on 03/16/2022  No orders of the defined types were placed in this encounter.  No orders of the defined types were placed in this encounter.   Cc: No ref. provider found,  Patient, No Pcp Per  Sarina Ill, MD  [facility] [address] [place], [place] [address]  Phone [phone] Fax [phone]

## 2022-04-28 ENCOUNTER — Ambulatory Visit
Admission: RE | Admit: 2022-04-28 | Discharge: 2022-04-28 | Disposition: A | Discharge status: Home / Self Care | Payer: No Typology Code available for payment source | Source: Ambulatory Visit | Attending: Oncology | Admitting: Oncology

## 2022-04-28 ENCOUNTER — Encounter: Payer: Self-pay | Admitting: Oncology

## 2022-04-28 ENCOUNTER — Inpatient Hospital Stay: Payer: No Typology Code available for payment source | Attending: Oncology | Admitting: Oncology

## 2022-04-28 ENCOUNTER — Inpatient Hospital Stay: Payer: No Typology Code available for payment source

## 2022-04-28 VITALS — BP 131/62 | HR 65 | Temp 97.4°F | Wt 178.9 lb

## 2022-04-28 DIAGNOSIS — D649 Anemia, unspecified: Secondary | ICD-10-CM | POA: Diagnosis not present

## 2022-04-28 DIAGNOSIS — Z7902 Long term (current) use of antithrombotics/antiplatelets: Secondary | ICD-10-CM | POA: Insufficient documentation

## 2022-04-28 DIAGNOSIS — Z8249 Family history of ischemic heart disease and other diseases of the circulatory system: Secondary | ICD-10-CM | POA: Insufficient documentation

## 2022-04-28 DIAGNOSIS — M899 Disorder of bone, unspecified: Secondary | ICD-10-CM | POA: Insufficient documentation

## 2022-04-28 DIAGNOSIS — Z841 Family history of disorders of kidney and ureter: Secondary | ICD-10-CM | POA: Insufficient documentation

## 2022-04-28 DIAGNOSIS — R5383 Other fatigue: Secondary | ICD-10-CM | POA: Diagnosis not present

## 2022-04-28 DIAGNOSIS — D696 Thrombocytopenia, unspecified: Secondary | ICD-10-CM

## 2022-04-28 DIAGNOSIS — Z880 Allergy status to penicillin: Secondary | ICD-10-CM | POA: Insufficient documentation

## 2022-04-28 DIAGNOSIS — G8929 Other chronic pain: Secondary | ICD-10-CM | POA: Diagnosis not present

## 2022-04-28 DIAGNOSIS — Z87891 Personal history of nicotine dependence: Secondary | ICD-10-CM | POA: Insufficient documentation

## 2022-04-28 DIAGNOSIS — Z8042 Family history of malignant neoplasm of prostate: Secondary | ICD-10-CM | POA: Insufficient documentation

## 2022-04-28 DIAGNOSIS — Z79899 Other long term (current) drug therapy: Secondary | ICD-10-CM | POA: Diagnosis not present

## 2022-04-28 DIAGNOSIS — Z803 Family history of malignant neoplasm of breast: Secondary | ICD-10-CM | POA: Diagnosis not present

## 2022-04-28 DIAGNOSIS — E611 Iron deficiency: Secondary | ICD-10-CM | POA: Insufficient documentation

## 2022-04-28 DIAGNOSIS — M898X9 Other specified disorders of bone, unspecified site: Secondary | ICD-10-CM | POA: Insufficient documentation

## 2022-04-28 DIAGNOSIS — E7800 Pure hypercholesterolemia, unspecified: Secondary | ICD-10-CM | POA: Insufficient documentation

## 2022-04-28 LAB — HEPATITIS PANEL, ACUTE
HCV Ab: NONREACTIVE
Hep A IgM: NONREACTIVE
Hep B C IgM: NONREACTIVE
Hepatitis B Surface Ag: NONREACTIVE

## 2022-04-28 LAB — BASIC METABOLIC PANEL - CANCER CENTER ONLY
Anion gap: 8 (ref 5–15)
BUN: 16 mg/dL (ref 8–23)
CO2: 22 mmol/L (ref 22–32)
Calcium: 8.9 mg/dL (ref 8.9–10.3)
Chloride: 107 mmol/L (ref 98–111)
Creatinine: 1.14 mg/dL (ref 0.61–1.24)
GFR, Estimated: >60 mL/min (ref >60)
Glucose, Bld: 94 mg/dL (ref 70–99)
Potassium: 4.1 mmol/L (ref 3.5–5.1)
Sodium: 137 mmol/L (ref 135–145)

## 2022-04-28 LAB — CBC WITH DIFFERENTIAL/PLATELET
Abs Immature Granulocytes: 0.05 10*3/uL (ref 0.00–0.07)
Basophils Absolute: 0 10*3/uL (ref 0.0–0.1)
Basophils Relative: 0 %
Eosinophils Absolute: 0.1 10*3/uL (ref 0.0–0.5)
Eosinophils Relative: 1 %
HCT: 39.6 % (ref 39.0–52.0)
Hemoglobin: 12.8 g/dL — ABNORMAL LOW (ref 13.0–17.0)
Immature Granulocytes: 1 %
Lymphocytes Relative: 18 %
Lymphs Abs: 0.9 10*3/uL (ref 0.7–4.0)
MCH: 25.9 pg — ABNORMAL LOW (ref 26.0–34.0)
MCHC: 32.3 g/dL (ref 30.0–36.0)
MCV: 80.2 fL (ref 80.0–100.0)
Monocytes Absolute: 0.3 10*3/uL (ref 0.1–1.0)
Monocytes Relative: 5 %
Neutro Abs: 4 10*3/uL (ref 1.7–7.7)
Neutrophils Relative %: 75 %
Platelets: 117 10*3/uL — ABNORMAL LOW (ref 150–400)
RBC: 4.94 MIL/uL (ref 4.22–5.81)
RDW: 14.6 % (ref 11.5–15.5)
WBC: 5.4 10*3/uL (ref 4.0–10.5)
nRBC: 0 % (ref 0.0–0.2)

## 2022-04-28 LAB — HEPATIC FUNCTION PANEL
ALT: 22 U/L (ref 0–44)
AST: 25 U/L (ref 15–41)
Albumin: 4.2 g/dL (ref 3.5–5.0)
Alkaline Phosphatase: 228 U/L — ABNORMAL HIGH (ref 38–126)
Bilirubin, Direct: 0.2 mg/dL (ref 0.0–0.2)
Indirect Bilirubin: 0.9 mg/dL (ref 0.3–0.9)
Total Bilirubin: 1.1 mg/dL (ref 0.3–1.2)
Total Protein: 7.4 g/dL (ref 6.5–8.1)

## 2022-04-28 LAB — FOLATE: Folate: 15.1 ng/mL (ref >5.9)

## 2022-04-28 LAB — RETIC PANEL
Immature Retic Fract: 18.3 % — ABNORMAL HIGH (ref 2.3–15.9)
RBC.: 4.9 MIL/uL (ref 4.22–5.81)
Retic Count, Absolute: 81.3 10*3/uL (ref 19.0–186.0)
Retic Ct Pct: 1.7 % (ref 0.4–3.1)
Reticulocyte Hemoglobin: 25.2 pg — ABNORMAL LOW (ref >27.9)

## 2022-04-28 LAB — TECHNOLOGIST SMEAR REVIEW: Plt Morphology: DECREASED

## 2022-04-28 LAB — HIV ANTIBODY (ROUTINE TESTING W REFLEX): HIV Screen 4th Generation wRfx: NONREACTIVE

## 2022-04-28 LAB — LACTATE DEHYDROGENASE: LDH: 176 U/L (ref 98–192)

## 2022-04-28 LAB — IMMATURE PLATELET FRACTION: Immature Platelet Fraction: 6.4 % (ref 1.2–8.6)

## 2022-04-28 LAB — PSA: Prostatic Specific Antigen: 231 ng/mL — ABNORMAL HIGH (ref 0.00–4.00)

## 2022-04-28 LAB — VITAMIN B12: Vitamin B-12: 358 pg/mL (ref 180–914)

## 2022-04-28 MED ORDER — TRAMADOL HCL 50 MG PO TABS: 50.0000 mg | ORAL_TABLET | Freq: Four times a day (QID) | ORAL | 0 refills | Status: DC | PRN

## 2022-04-28 NOTE — Addendum Note (Signed)
Addended by: Earlie Server on: 04/28/2022 07:37 PM   Modules accepted: Orders

## 2022-04-28 NOTE — Assessment & Plan Note (Signed)
Check PSA, myeloma panel, cbc, cmp Check skeletal survey.

## 2022-04-28 NOTE — Assessment & Plan Note (Signed)
Decreased retic hemoglobin. Indicating iron deficiency. Check iron tibc ferritin.

## 2022-04-28 NOTE — Progress Notes (Signed)
Hematology/Oncology Consult Note [phone] [phone]     REFERRING PROVIDER: Dulce Sellar, MD    CHIEF COMPLAINTS/PURPOSE OF CONSULTATION:  Lytic lesion.   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    Thrombocytopenia (HCC) Chronic thrombocytopenia, check cbc, smear, B12, Folate, flowcytometry, ldh, HIV, hepatitis.   Normocytic anemia Decreased retic hemoglobin. Indicating iron deficiency. Check iron tibc ferritin.    Orders Placed This Encounter  Procedures   DG Bone Survey Met    Standing Status:   Future    Number of Occurrences:   1    Standing Expiration Date:   04/28/2023    Order Specific Question:   Reason for Exam (SYMPTOM  OR DIAGNOSIS REQUIRED)    Answer:   Bone lesion    Order Specific Question:   Preferred imaging location?    Answer:   [facility]   PSA    Standing Status:   Future    Number of Occurrences:   1    Standing Expiration Date:   04/28/2023   CBC with Differential/Platelet    Standing Status:   Future    Number of Occurrences:   1    Standing Expiration Date:   04/28/2023   Retic Panel    Standing Status:   Future    Number of Occurrences:   1    Standing Expiration Date:   04/28/2023   Multiple Myeloma Panel (SPEP&IFE w/QIG)    Standing Status:   Future    Number of Occurrences:   1    Standing Expiration Date:   04/28/2023   Kappa/lambda light chains    Standing Status:   Future    Number of Occurrences:   1    Standing Expiration Date:   04/28/2023   Flow cytometry panel-leukemia/lymphoma work-up    Standing Status:   Future    Number of Occurrences:   1    Standing Expiration Date:   04/28/2023   Folate    Standing Status:   Future    Number of Occurrences:   1    Standing Expiration Date:   04/28/2023   Vitamin B12    Standing Status:   Future    Number of Occurrences:   1    Standing Expiration Date:   04/28/2023   Hepatitis  panel, acute    Standing Status:   Future    Number of Occurrences:   1    Standing Expiration Date:   04/28/2023   HIV Antibody (routine testing w rflx)    Standing Status:   Future    Number of Occurrences:   1    Standing Expiration Date:   04/28/2023   Lactate dehydrogenase    Standing Status:   Future    Number of Occurrences:   1    Standing Expiration Date:   123XX123   Basic Metabolic Panel - Cancer Center Only    Standing Status:   Future    Number of Occurrences:   1    Standing Expiration Date:   04/28/2023   Hepatic function panel    Standing Status:   Future    Number of Occurrences:   1    Standing Expiration Date:   04/28/2023   Immature Platelet Fraction    Standing Status:   Future    Number of Occurrences:   1    Standing Expiration Date:  04/28/2023   Technologist smear review    Standing Status:   Future    Number of Occurrences:   1    Standing Expiration Date:   04/28/2023    Order Specific Question:   Clinical information:    Answer:   thrombocytopenia, lytic bone lesion.   Follow up TBD All questions were answered. The patient knows to call the clinic with any problems, questions or concerns.  Earlie Server, MD, PhD [facility] Hematology Oncology 04/28/2022    HISTORY OF PRESENTING ILLNESS:  Kevin Santiago 82 y.o. male presents to establish care for lytic bone lesion.  Patient reports having chronic pain of his pubic area for a year.  Xray showed lytic destructive lesion involving the left inferior pubic ramus He also has a history of thrombocytopenia, platelet ranges in 82,000-92,000.  He used to drink liquor sometimes, quitted 5 years ago.  Family history of breast cancer in 2 sisters, 1 brother with prostatae cancer    MEDICAL HISTORY:  Past Medical History:  Diagnosis Date   Anxiety    Depression    Hypercholesteremia    Hypertension     SURGICAL HISTORY: Past Surgical History:  Procedure Laterality Date   cervical fusion     IR ANGIO  INTRA EXTRACRAN SEL INTERNAL CAROTID BILAT MOD SED  01/02/2021   IR ANGIO VERTEBRAL SEL SUBCLAVIAN INNOMINATE UNI L MOD SED  01/02/2021   IR ANGIO VERTEBRAL SEL SUBCLAVIAN INNOMINATE UNI R MOD SED  07/07/2021   IR ANGIO VERTEBRAL SEL VERTEBRAL UNI R MOD SED  01/02/2021   IR CT HEAD LTD  01/02/2021   IR INTRA CRAN STENT  01/02/2021   IR NEURO EACH ADD'L AFTER BASIC UNI RIGHT (MS)  01/02/2021   IR US GUIDE VASC ACCESS RIGHT  07/07/2021   RADIOLOGY WITH ANESTHESIA N/A 01/02/2021   Procedure: IR WITH ANESTHESIA;  Surgeon: Pedro Earls, MD;  Location: [facility];  Service: Radiology;  Laterality: N/A;   SHOULDER SURGERY Left 1990   WRIST FRACTURE SURGERY      SOCIAL HISTORY: Social History   Socioeconomic History   Marital status: Single    Spouse name: Not on file   Number of children: Not on file   Years of education: Not on file   Highest education level: Not on file  Occupational History   Not on file  Tobacco Use   Smoking status: Former    Types: Cigarettes    Quit date: 1970    Years since quitting: 54.2   Smokeless tobacco: Never   Tobacco comments:    Quit smoking in 1970's; up until that time, 1 pack would last him 1 month.   Substance and Sexual Activity   Alcohol use: Not Currently   Drug use: No   Sexual activity: Not on file  Other Topics Concern   Not on file  Social History Narrative   Not on file   Social Determinants of Health   Financial Resource Strain: Low Risk  (04/28/2022)   Overall Financial Resource Strain (CARDIA)    Difficulty of Paying Living Expenses: Not very hard  Food Insecurity: No Food Insecurity (04/28/2022)   Hunger Vital Sign    Worried About Running Out of Food in the Last Year: Never true    Ran Out of Food in the Last Year: Never true  Transportation Needs: No Transportation Needs (04/28/2022)   PRAPARE - Transportation    Lack of Transportation (Medical): No    Lack of  Transportation (Non-Medical): No  Physical Activity: Not on  file  Stress: No Stress Concern Present (04/28/2022)   [place]    Feeling of Stress : Only a little  Social Connections: Not on file  Intimate Partner Violence: Not At Risk (04/28/2022)   Humiliation, Afraid, Rape, and Kick questionnaire    Fear of Current or Ex-Partner: No    Emotionally Abused: No    Physically Abused: No    Sexually Abused: No    FAMILY HISTORY: Family History  Problem Relation Age of Onset   Kidney disease Mother    Heart attack Father    Cancer Sister    Cancer Brother    Cancer Sister     ALLERGIES:  is allergic to alendronate sodium, nitroglycerin, simvastatin, other, and penicillins.  MEDICATIONS:  Current Outpatient Medications  Medication Sig Dispense Refill   Cholecalciferol (VITAMIN D) 50 MCG (2000 UT) tablet Take 4,000 Units by mouth daily.     ferrous sulfate 325 (65 FE) MG tablet Take 325 mg by mouth daily as needed (energy).     hydroxypropyl methylcellulose (ISOPTO TEARS) 2.5 % ophthalmic solution Place 2 drops into both eyes 2 (two) times daily as needed for dry eyes.     losartan (COZAAR) 50 MG tablet Take 50 mg by mouth daily.     methocarbamol (ROBAXIN) 500 MG tablet TAKE ONE TABLET BY MOUTH FOUR TIMES A DAY FOR MUSCLE PAIN     omeprazole (PRILOSEC) 20 MG capsule Take 20 mg by mouth daily as needed (acid reflux).     oxybutynin (DITROPAN-XL) 5 MG 24 hr tablet TAKE ONE TABLET BY MOUTH DAILY FOR OVERACTIVE BLADDER     pravastatin (PRAVACHOL) 80 MG tablet Take 80 mg by mouth daily.     sertraline (ZOLOFT) 100 MG tablet Take 100 mg by mouth 2 (two) times daily.     ticagrelor (BRILINTA) 90 MG TABS tablet Take 1 tablet (90 mg total) by mouth 2 (two) times daily. 60 tablet 2   traMADol (ULTRAM) 50 MG tablet Take 1 tablet (50 mg total) by mouth every 6 (six) hours as needed. 30 tablet 0   acetaminophen (TYLENOL) 500 MG tablet Take 1,000 mg by mouth every 6 (six) hours as needed for  moderate pain. (Patient not taking: Reported on 04/28/2022)     aspirin EC 81 MG tablet Take 1 tablet (81 mg total) by mouth daily. Swallow whole. (Patient not taking: Reported on 04/28/2022) 30 tablet 12   cyclobenzaprine (FLEXERIL) 5 MG tablet Take 1 tablet (5 mg total) by mouth 3 (three) times daily as needed for muscle spasms. (Patient not taking: Reported on 07/03/2021) 20 tablet 0   fluorouracil (EFUDEX) 5 % cream Apply 1 application topically daily as needed. Skin basil cell areas as directed (Patient not taking: Reported on 04/28/2022)     hydrocortisone (ANUSOL-HC) 2.5 % rectal cream Place 1 application rectally 2 (two) times daily. (Patient not taking: Reported on 04/28/2022) 30 g 0   tamsulosin (FLOMAX) 0.4 MG CAPS capsule Take 0.4 mg by mouth at bedtime. (Patient not taking: Reported on 04/28/2022)     terbinafine (LAMISIL) 1 % cream Apply 1 application. topically 2 (two) times daily as needed (itching). (Patient not taking: Reported on 04/28/2022)     No current facility-administered medications for this visit.    Review of Systems  Constitutional:  Positive for fatigue. Negative for appetite change, chills, fever and unexpected weight change.  HENT:  Negative for hearing loss and voice change.   Eyes:  Negative for eye problems and icterus.  Respiratory:  Negative for chest tightness, cough and shortness of breath.   Cardiovascular:  Negative for chest pain and leg swelling.  Gastrointestinal:  Negative for abdominal distention and abdominal pain.  Endocrine: Negative for hot flashes.  Genitourinary:  Negative for difficulty urinating and dysuria.   Musculoskeletal:  Negative for arthralgias.       Pain in the pubic area  Skin:  Negative for itching and rash.  Neurological:  Negative for light-headedness and numbness.  Hematological:  Negative for adenopathy. Does not bruise/bleed easily.  Psychiatric/Behavioral:  Negative for confusion.      PHYSICAL EXAMINATION: ECOG  PERFORMANCE STATUS: 1 - Symptomatic but completely ambulatory  Vitals:   04/28/22 1111  BP: 131/62  Pulse: 65  Temp: (!) 97.4 F (36.3 C)  SpO2: 98%   Filed Weights   04/28/22 1111  Weight: 178 lb 14.4 oz (81.1 kg)    Physical Exam Constitutional:      General: He is not in acute distress.    Appearance: He is not diaphoretic.     Comments: He walks with a cane   HENT:     Head: Normocephalic and atraumatic.  Eyes:     General: No scleral icterus.    Pupils: Pupils are equal, round, and reactive to light.  Cardiovascular:     Rate and Rhythm: Normal rate and regular rhythm.     Heart sounds: No murmur heard. Pulmonary:     Effort: Pulmonary effort is normal. No respiratory distress.  Abdominal:     General: Bowel sounds are normal. There is no distension.     Palpations: Abdomen is soft.  Musculoskeletal:        General: Normal range of motion.     Cervical back: Normal range of motion and neck supple.  Skin:    General: Skin is warm and dry.     Findings: No erythema.  Neurological:     Mental Status: He is alert and oriented to person, place, and time. Mental status is at baseline.     Cranial Nerves: No cranial nerve deficit.     Motor: No abnormal muscle tone.  Psychiatric:        Mood and Affect: Mood and affect normal.      LABORATORY DATA:  I have reviewed the data as listed    Latest Ref Rng & Units 04/28/2022   12:04 PM 07/07/2021    7:06 AM 01/01/2021    2:50 AM  CBC  WBC 4.0 - 10.5 K/uL 5.4  4.6  5.6   Hemoglobin 13.0 - 17.0 g/dL 12.8  14.4  15.0   Hematocrit 39.0 - 52.0 % 39.6  43.3  44.8   Platelets 150 - 400 K/uL 117  82  92       Latest Ref Rng & Units 04/28/2022   12:04 PM 07/07/2021    7:06 AM 01/01/2021    2:50 AM  CMP  Glucose 70 - 99 mg/dL 94  106  98   BUN 8 - 23 mg/dL 16  19  13    Creatinine 0.61 - 1.24 mg/dL 1.14  1.33  1.20   Sodium 135 - 145 mmol/L 137  140  137   Potassium 3.5 - 5.1 mmol/L 4.1  4.5  3.9   Chloride 98 - 111  mmol/L 107  109  106   CO2 22 - 32 mmol/L 22  23  23   Calcium 8.9 - 10.3 mg/dL 8.9  9.3  9.0   Total Protein 6.5 - 8.1 g/dL 7.4   6.2   Total Bilirubin 0.3 - 1.2 mg/dL 1.1   1.6   Alkaline Phos 38 - 126 U/L 228   45   AST 15 - 41 U/L 25   19   ALT 0 - 44 U/L 22   20      RADIOGRAPHIC STUDIES: I have personally reviewed the radiological images as listed and agreed with the findings in the report. No results found.

## 2022-04-28 NOTE — Assessment & Plan Note (Signed)
Chronic thrombocytopenia, check cbc, smear, B12, Folate, flowcytometry, ldh, HIV, hepatitis.

## 2022-04-29 ENCOUNTER — Other Ambulatory Visit: Payer: Self-pay

## 2022-04-29 ENCOUNTER — Telehealth: Payer: Self-pay

## 2022-04-29 DIAGNOSIS — D649 Anemia, unspecified: Secondary | ICD-10-CM

## 2022-04-29 LAB — IRON AND TIBC
Iron: 36 ug/dL — ABNORMAL LOW (ref 45–182)
Saturation Ratios: 12 % — ABNORMAL LOW (ref 17.9–39.5)
TIBC: 311 ug/dL (ref 250–450)
UIBC: 275 ug/dL

## 2022-04-29 LAB — KAPPA/LAMBDA LIGHT CHAINS
Kappa free light chain: 29.7 mg/L — ABNORMAL HIGH (ref 3.3–19.4)
Kappa, lambda light chain ratio: 1.68 — ABNORMAL HIGH (ref 0.26–1.65)
Lambda free light chains: 17.7 mg/L (ref 5.7–26.3)

## 2022-04-29 LAB — FERRITIN: Ferritin: 415 ng/mL — ABNORMAL HIGH (ref 24–336)

## 2022-04-29 NOTE — Telephone Encounter (Signed)
Notified lab that we would like to add on iron, TIBC, and ferritin to patient's labs from 04/28/22. Lab stated that they were able to add labs and it was done.

## 2022-04-29 NOTE — Telephone Encounter (Signed)
-----   Message from Earlie Server, MD sent at 04/28/2022  7:37 PM EDT ----- Please add on Iron tibc ferritin, ordered.

## 2022-04-30 LAB — COMP PANEL: LEUKEMIA/LYMPHOMA

## 2022-05-03 LAB — MULTIPLE MYELOMA PANEL, SERUM
Albumin SerPl Elph-Mcnc: 3.8 g/dL (ref 2.9–4.4)
Albumin/Glob SerPl: 1.4 (ref 0.7–1.7)
Alpha 1: 0.4 g/dL (ref 0.0–0.4)
Alpha2 Glob SerPl Elph-Mcnc: 0.7 g/dL (ref 0.4–1.0)
B-Globulin SerPl Elph-Mcnc: 1 g/dL (ref 0.7–1.3)
Gamma Glob SerPl Elph-Mcnc: 0.7 g/dL (ref 0.4–1.8)
Globulin, Total: 2.8 g/dL (ref 2.2–3.9)
IgA: 222 mg/dL (ref 61–437)
IgG (Immunoglobin G), Serum: 703 mg/dL (ref 603–1613)
IgM (Immunoglobulin M), Srm: 53 mg/dL (ref 15–143)
Total Protein ELP: 6.6 g/dL (ref 6.0–8.5)

## 2022-05-05 ENCOUNTER — Telehealth: Payer: Self-pay

## 2022-05-05 DIAGNOSIS — M898X9 Other specified disorders of bone, unspecified site: Secondary | ICD-10-CM

## 2022-05-05 DIAGNOSIS — M899 Disorder of bone, unspecified: Secondary | ICD-10-CM

## 2022-05-05 NOTE — Telephone Encounter (Signed)
Called and spoke to patient regarding recommendation for MRI. Pt verbalized understanding.   Please schedule and call pt with appt details:   MRI Pelvis

## 2022-05-05 NOTE — Telephone Encounter (Signed)
-----   Message from Earlie Server, MD sent at 05/04/2022  6:00 PM EDT ----- Please let patient know that his blood work showed elevated PSA level of 231.  This could be related to acute on chronic inflammation of prostate or can be due to prostate cancer. I recommend further workup of MRI pelvis w and wo contrast - lytic pubis ramus lesion and elevated PSA  If he agrees please arrange.

## 2022-05-06 ENCOUNTER — Encounter: Payer: Self-pay | Admitting: Urology

## 2022-05-06 ENCOUNTER — Ambulatory Visit: Payer: Medicare PPO | Admitting: Urology

## 2022-05-06 VITALS — BP 115/68 | HR 66 | Ht 70.0 in | Wt 180.0 lb

## 2022-05-06 DIAGNOSIS — N401 Enlarged prostate with lower urinary tract symptoms: Secondary | ICD-10-CM | POA: Diagnosis not present

## 2022-05-06 DIAGNOSIS — R972 Elevated prostate specific antigen [PSA]: Secondary | ICD-10-CM | POA: Diagnosis not present

## 2022-05-06 NOTE — Patient Instructions (Signed)
Prostate Biopsy Instructions  Stop all aspirin or blood thinners (aspirin, plavix, coumadin, warfarin, motrin, ibuprofen, advil, aleve, naproxen, naprosyn) for 7 days prior to the procedure.  If you have any questions about stopping these medications, please contact your primary care physician or cardiologist.  Having a light meal prior to the procedure is recommended.  If you are diabetic or have low blood sugar please bring a small snack or glucose tablet.  A Fleets enema is needed to be purchased over the counter at a local pharmacy and used 2 hours before you scheduled appointment.  This can be purchased over the counter at any pharmacy.  Antibiotics will be administered in the clinic at the time of the procedure unless otherwise specified.    Please bring someone with you to the procedure to drive you home.  A follow up appointment has been scheduled for you to receive the results of the biopsy.  If you have any questions or concerns, please feel free to call the office at [phone] or send a Mychart message.    Thank you, Staff at [facility]  

## 2022-05-06 NOTE — Progress Notes (Signed)
I, [name],acting as a scribe for Kevin Sons, MD.,have documented all relevant documentation on the behalf of Kevin Sons, MD,as directed by  Kevin Sons, MD while in the presence of Kevin Sons, MD.   I, [name],acting as a scribe for Kevin Sons, MD.,have documented all relevant documentation on the behalf of Kevin Sons, MD,as directed by  Kevin Sons, MD while in the presence of Kevin Sons, MD.  05/06/2022 11:32 AM   Kevin Santiago December 19, 1940 [id number]  Referring provider: [facility] [address] [place],  [place] [address]  Chief Complaint  Patient presents with   Other    HPI: Kevin Santiago is a 82 y.o. male presents for an evaluation of elevated PSA.  Saw Dr. Tasia Catchings 04/28/22 for lytic lesion seen on bone. PSA ordered which was significantly elevated at 231. No prior PSA results available for comparison on chart review. He reports having urinary frequency and weak stream for about a month and a half.  Nocturia 1x, previously 6x Currently on Flomax and Oxybutynin.  [facility] [place] for [facility] and overactive bladder.  PSA in December was elevated at 34.  Repeated March 2024 and was 173.  A prostate MRI was ordered but never performed.  He denies previous prostate biopsy   PMH: Past Medical History:  Diagnosis Date   Anxiety    Depression    Hypercholesteremia    Hypertension     Surgical History: Past Surgical History:  Procedure Laterality Date   cervical fusion     IR ANGIO INTRA EXTRACRAN SEL INTERNAL CAROTID BILAT MOD SED  01/02/2021   IR ANGIO VERTEBRAL SEL SUBCLAVIAN INNOMINATE UNI L MOD SED  01/02/2021   IR ANGIO VERTEBRAL SEL SUBCLAVIAN INNOMINATE UNI R MOD SED  07/07/2021   IR ANGIO VERTEBRAL SEL VERTEBRAL UNI R MOD SED  01/02/2021   IR CT HEAD LTD  01/02/2021   IR INTRA CRAN STENT  01/02/2021   IR NEURO EACH ADD'L AFTER BASIC UNI RIGHT (MS)  01/02/2021   IR US GUIDE VASC ACCESS  RIGHT  07/07/2021   RADIOLOGY WITH ANESTHESIA N/A 01/02/2021   Procedure: IR WITH ANESTHESIA;  Surgeon: Pedro Earls, MD;  Location: [facility];  Service: Radiology;  Laterality: N/A;   SHOULDER SURGERY Left 1990   WRIST FRACTURE SURGERY      Home Medications:  Allergies as of 05/06/2022       Reactions   Alendronate Sodium    Indigestion   Nitroglycerin Other (See Comments)   Patient became hypotensive & bradycardiac after administration of medication.    Simvastatin    Weakness present   Other Rash   Fish sticks only. Does fine with all other fish products.    Penicillins Itching, Rash        Medication List        Accurate as of May 06, 2022 11:32 AM. If you have any questions, ask your nurse or doctor.          STOP taking these medications    cyclobenzaprine 5 MG tablet Commonly known as: FLEXERIL Stopped by: Kevin Sons, MD   hydrocortisone 2.5 % rectal cream Commonly known as: Anusol-HC Stopped by: Kevin Sons, MD   ticagrelor 90 MG Tabs tablet Commonly known as: BRILINTA Stopped by: Kevin Sons, MD       TAKE these medications    acetaminophen 500  MG tablet Commonly known as: TYLENOL Take 1,000 mg by mouth every 6 (six) hours as needed for moderate pain.   aspirin 325 MG tablet Take 325 mg by mouth daily. What changed: Another medication with the same name was removed. Continue taking this medication, and follow the directions you see here. Changed by: Kevin Sons, MD   ferrous sulfate 325 (65 FE) MG tablet Take 325 mg by mouth daily as needed (energy).   fluorouracil 5 % cream Commonly known as: EFUDEX Apply 1 application  topically daily as needed. Skin basil cell areas as directed   hydroxypropyl methylcellulose / hypromellose 2.5 % ophthalmic solution Commonly known as: ISOPTO TEARS / GONIOVISC Place 2 drops into both eyes 2 (two) times daily as needed for dry eyes.   losartan 50 MG tablet Commonly known as:  COZAAR Take 50 mg by mouth daily.   methocarbamol 500 MG tablet Commonly known as: ROBAXIN TAKE ONE TABLET BY MOUTH FOUR TIMES A DAY FOR MUSCLE PAIN   omeprazole 20 MG capsule Commonly known as: PRILOSEC Take 20 mg by mouth daily as needed (acid reflux).   oxybutynin 5 MG 24 hr tablet Commonly known as: DITROPAN-XL TAKE ONE TABLET BY MOUTH DAILY FOR OVERACTIVE BLADDER   pravastatin 80 MG tablet Commonly known as: PRAVACHOL Take 80 mg by mouth daily.   sertraline 100 MG tablet Commonly known as: ZOLOFT Take 100 mg by mouth 2 (two) times daily.   tamsulosin 0.4 MG Caps capsule Commonly known as: FLOMAX Take 0.4 mg by mouth at bedtime.   terbinafine 1 % cream Commonly known as: LAMISIL Apply 1 application  topically 2 (two) times daily as needed (itching).   traMADol 50 MG tablet Commonly known as: ULTRAM Take 1 tablet (50 mg total) by mouth every 6 (six) hours as needed.   Vitamin D 50 MCG (2000 UT) tablet Take 4,000 Units by mouth daily.        Allergies:  Allergies  Allergen Reactions   Alendronate Sodium     Indigestion   Nitroglycerin Other (See Comments)    Patient became hypotensive & bradycardiac after administration of medication.    Simvastatin     Weakness present   Other Rash    Fish sticks only. Does fine with all other fish products.    Penicillins Itching and Rash    Family History: Family History  Problem Relation Age of Onset   Kidney disease Mother    Heart attack Father    Cancer Sister    Cancer Brother    Cancer Sister     Social History:  reports that he quit smoking about 54 years ago. His smoking use included cigarettes. He has never used smokeless tobacco. He reports that he does not currently use alcohol. He reports that he does not use drugs.   Physical Exam: BP 115/68   Pulse 66   Ht 5\' 10"  (1.778 m)   Wt 180 lb (81.6 kg)   BMI 25.83 kg/m   Constitutional:  Alert and oriented, No acute distress. HEENT: [place]  AT Respiratory: Normal respiratory effort, no increased work of breathing. GI: Abdomen is soft, nontender, nondistended, no abdominal masses GU: Prostate 60 grams, left side hard, right abnormally firm. Skin: No rashes, bruises or suspicious lesions. Neurologic: Grossly intact, no focal deficits, moving all 4 extremities. Psychiatric: Normal mood and affect.    Assessment & Plan:    Elevated PSA Discussed with patient based on his PSA and DRE findings are suspicious  for prostate cancer. Recommend scheduling prostate biopsy. Procedure discussed including potential risks of bleeding and infections/sepsis. Schedule for prostate MRI later this week.  I have reviewed the above documentation for accuracy and completeness, and I agree with the above.   Kevin Santiago, [facility] [address] [place], [place] [address] [phone]

## 2022-05-08 ENCOUNTER — Ambulatory Visit
Admission: RE | Admit: 2022-05-08 | Discharge: 2022-05-08 | Disposition: A | Discharge status: Home / Self Care | Payer: Medicare PPO | Source: Ambulatory Visit | Attending: Oncology | Admitting: Oncology

## 2022-05-08 DIAGNOSIS — M899 Disorder of bone, unspecified: Secondary | ICD-10-CM | POA: Insufficient documentation

## 2022-05-08 MED ORDER — GADOBUTROL 1 MMOL/ML IV SOLN
7.5000 mL | Freq: Once | INTRAVENOUS | Status: AC | PRN
  Administered 2022-05-08: 7.5 mL via INTRAVENOUS

## 2022-05-13 ENCOUNTER — Telehealth: Payer: Self-pay

## 2022-05-13 NOTE — Telephone Encounter (Signed)
Called and spoke to pt. Informed him of plan for biopsy and that Dr. Virl Diamond office will reach out to him with further information. Pt verbalized understanding.   Added to TBD list

## 2022-05-13 NOTE — Telephone Encounter (Signed)
-----   Message from Rickard Patience, MD sent at 05/13/2022  8:59 AM EDT ----- I called him twice and not able to reach.  Please let patient know that his MRI pelvis findings are concerning for prostate cancer. I have discussed with his urologist Dr.Stoiff who will arrange him for a prostate biopsy.  I will see him after biopsy result is back to review treatment options. Thanks.  Please put him on TBD list, revisit chart in 3 weeks. Thanks.

## 2022-05-18 ENCOUNTER — Encounter: Payer: Self-pay | Admitting: Neurology

## 2022-05-18 ENCOUNTER — Ambulatory Visit (INDEPENDENT_AMBULATORY_CARE_PROVIDER_SITE_OTHER): Payer: No Typology Code available for payment source | Admitting: Neurology

## 2022-05-18 VITALS — BP 118/59 | HR 70 | Ht 70.0 in | Wt 171.4 lb

## 2022-05-18 DIAGNOSIS — C61 Malignant neoplasm of prostate: Secondary | ICD-10-CM

## 2022-05-18 DIAGNOSIS — H539 Unspecified visual disturbance: Secondary | ICD-10-CM | POA: Diagnosis not present

## 2022-05-18 DIAGNOSIS — R200 Anesthesia of skin: Secondary | ICD-10-CM

## 2022-05-18 DIAGNOSIS — C799 Secondary malignant neoplasm of unspecified site: Secondary | ICD-10-CM

## 2022-05-18 DIAGNOSIS — R1310 Dysphagia, unspecified: Secondary | ICD-10-CM | POA: Diagnosis not present

## 2022-05-18 DIAGNOSIS — Z8673 Personal history of transient ischemic attack (TIA), and cerebral infarction without residual deficits: Secondary | ICD-10-CM

## 2022-05-18 NOTE — Progress Notes (Addendum)
[facility]    Provider:  Dr Lucia Gaskins Requesting Provider: No ref. provider found Primary Care Provider:  No primary care provider on file.  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 male here as requested by No ref. provider found for stroke-like symptoms.  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  Recently seen at [facility] cancer center.  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, the partially visualized lower lumbar spine and the proximal femurs, the lytic lesion on recent bone survey correlates with a destructive inferior pubic ramus metastasis with associated soft tissue mass which measures 5.2 x 4.2 x 5 cm, extensive involvement of the left femoral neck which is likely at risk for pathologic fracture highly suspicious for prostate carcinoma, with possible neurovascular bundle and seminal vesicle involvement, bilateral iliac lymphadenopathy, compatible with disease involvement" has Splenomegaly; Spleen injury; Other pancytopenia; Hemoperitoneum; Thrombocytopenia; Hypocalcemia; Prediabetes; AKI (acute kidney injury); Vitamin D insufficiency; CVA (cerebral vascular accident); Hypertension; Hypercholesteremia;  Anxiety; BPH (benign prostatic hyperplasia); GERD (gastroesophageal reflux disease); Stroke (cerebrum); Lytic lesion of bone on x-ray; and Normocytic anemia on their problem list.  Also anxiety, depression.  X-ray showed that he has had postsurgical changes to the lower spine in the past.  Recent chest x-ray earlier this month showed subtle patchy and hazy airspace disease involving the right lower lobe possibly infectious or inflammatory process.  Reviewing his records he sees many specialties at the [facility] of [facility] and recently was seen at the [facility] oncology center.  He also has a 4 level cervical fusion.  I reviewed patient's charts.  He was recently seen at [facility] oncology for sarcoma.  May 14, 2022 Kevin Santiago, was diagnosed with metastasis to the bone.  Very complicated patient.  It appears he also has chronic kidney disease. Looking at his [facility] records online, he is a former smoker but quit 15 years or more ago, he was seen by oncology here at [facility] in March but looks like he was most recently seen this month at [facility] oncology.  In March of this year the assessment and plan was lytic lesion of bone on x-ray, he does have elevated PSA, also chronic thrombocytopenia, at that time they stated normocytic anemia.  DG bone survey metastasis was ordered.  Also multiple myeloma, B12, flow cytometry, folate, hepatitis, HIV and multiple other lab tests here at [facility].  They are planning for a biopsy as of 05/13/2022 because the MRI of his pelvis concerning for prostate cancer and prostate biopsy is needed.  Recent MRI of the pelvis showed lytic pubic ramus lesion and elevated PSA.  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  He was discharged on aspirin and Brilinta.  On 07/07/2021 he underwent a follow-up diagnostic cerebral angiogram that showed widely patent intracranial stent and he was continued on Brilinta and aspirin 325.  He was doing well according to Dr. Luna Glasgow until a few weeks ago when he started feeling that his gait was slightly imbalanced and dizzy, and again seeing squiggly lines in his right eye, he underwent an MR angiogram that showed patent intracranial stents with possible mild stenosis of the right vertebral artery stent, no new focal weakness numbness or episodes of aphasia of dysarthria, having problems with his bladder that we now know may be prostate cancer and following with urology.  Has not been drinking water, slight balance gait and dizziness in the setting of possible dehydration, he was continued on dual antiplatelet therapy with Brilinta 90 mg twice daily and aspirin 81 mg daily.  They discussed a cerebral angiogram with possible angioplasty if he continued to have symptoms including the possible mild stenosis of his right vertebral artery stent.   He is not having stroke-like symptoms currently. He had squiggly lines in the eyes and he was dizzy. No sqiggly lines, no dizziness, he has severe pain in the pelvis, no focal weakness, he has numbness in his right hand when he wakes up in the morning and hs to shake them out not coming from the neck. No weakness one arm/leg but did when he saw vascular but since discovered the mets, no facial drooling, he has some difficulty swallowing just hard to get it down.He has to get new glasses, when he sees bright lights takes a minute to readjust, but no focal vision loss, no facial numbness, no deficits from old stroke, no  current symptoms but feel blurry a little in the vision, numbness in the hands, imbalance but could ne the mets, still on DUAP therapy, he is going back to see Dr. Sherlon Handing and they did talk about stopping the DUAP due to almost 6 months after stents will let Dr. Sherlon Handing. No disorientation. No slurred speech now. No other focal neurologic deficits, associated symptoms, inciting events or modifiable factors.   Reviewed notes, labs and imaging from outside physicians, which showed:  MRA head: EXAM: 03/16/2022 MRA HEAD WITHOUT CONTRAST   TECHNIQUE: Angiographic images of the Circle of Willis were acquired using MRA technique without intravenous contrast.   COMPARISON:  Head CT 05/14/2021.  CTA head/neck 12/31/2020.   FINDINGS: Anterior circulation: Intracranial ICAs are patent without stenosis or aneurysm. The proximal ACAs and MCAs are patent without stenosis or aneurysm. Distal branches are symmetric.   Posterior circulation: Patent right vertebral artery stent. Patent basilar artery stent. The SCAs, AICAs and PICAs are patent proximally. The PCAs are patent proximally without stenosis or aneurysm. Distal branches are symmetric.   Anatomic variants: Persistent fetal origin of the right PCA with hypoplastic right P1 segment.   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   IMPRESSION: 1. Patent right vertebral artery and basilar artery stents. 2. No large vessel occlusion or high-grade stenosis.    EXAM: MRI PELVIS WITHOUT AND WITH CONTRAST  TECHNIQUE: Multiplanar multisequence MR imaging of the pelvis was performed both before and after administration of intravenous contrast.   CONTRAST:  7.45mL GADAVIST GADOBUTROL 1 MMOL/ML IV SOLN   COMPARISON:  Bone survey 04/28/2022   FINDINGS: Urinary Tract:  No abnormality visualized.   Bowel:  Unremarkable visualized pelvic bowel loops.   Vascular/Lymphatic: There is  bilateral iliac chain lymphadenopathy. For reference:   -Right external iliac lymph node measures 1.8 cm short axis (series 9, image 22).   -Left external iliac lymph node measures 1.6 cm short axis 0 (series 9, image 22).   There are additional prominent internal iliac lymph nodes in a partially visualized prominent right common iliac lymph node.   Reproductive: Enlarged prostate gland. There is a T2 hypointense lesion which replaces the left peripheral zone and central right preserved pleural zone of the prostate gland, and homogeneous, non-circumscribed hypointense signal in the adjacent transitional zone. There is associated enhancement. There is probable invasion of the posterior neurovascular bundles and likely involvement of the seminal vesicles.   Other:  None.   Musculoskeletal: There are diffuse T2 hyperintense/T1 hypointense lesions throughout the pelvis, the partially visualized lower lumbar spine, and the proximal femurs. The lytic lesion noted on recent bone survey correlates with a large lesion which erodes the left inferior pubic ramus and has an associated soft tissue mass which measures approximately 5.2 x 4.2 x 5.0 cm (axial postcontrast image 47, sagittal T2 image 45). There is extensive involvement of the base of the left femoral neck with areas of necrosis along the superolateral base of the femoral neck and at the lesser tuberosity, likely at risk for pathologic fracture.   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 The lytic lesion on recent bone survey correlates with a destructive inferior pubic ramus metastasis with associated soft tissue mass which measures 5.2 x 4.2 x 5.0 cm. Extensive involvement of the left femoral neck, which is likely at risk for pathologic fracture.   Findings are highly suspicious for prostate carcinoma, with posterior  neurovascular bundle and seminal vesicle involvement.   Bilateral iliac lymphadenopathy, compatible with disease involvement.    05/24/21: MRI l spine: IMPRESSION: 1. Mild lumbar spine spondylosis as described above. 2. No acute osseous injury of the lumbar spine.   MRI brain 12/31/2020: EXAM: MRI HEAD WITHOUT CONTRAST   TECHNIQUE: Multiplanar, multiecho pulse sequences of the brain and surrounding structures were obtained without intravenous contrast.   COMPARISON:  CT head 12/31/2020   FINDINGS: Brain: Acute infarct in the left pons. Small area of acute infarct in the right posterior pons in the floor of the fourth ventricle.   Ventricle size and cerebral volume normal. Negative for hemorrhage or mass. Normal white matter.   Vascular: Normal arterial flow voids   Skull and upper cervical spine: Negative   Sinuses/Orbits: Mucosal edema paranasal sinuses.  Negative orbit   Other: None   IMPRESSION: Acute infarct in the left pons. Tiny acute infarct in the right posterior pons. No significant chronic ischemia   Recent labs from the [facility] May 11, 2022 showed LDL 107, triglycerides 131, HDL 31, BUN 19, creatinine 1.07, glucose 102, sodium 139, otherwise unremarkable except for slightly elevated AST at 65, very elevated 8 LP at 561, hemoglobin A1c 5.9, TSH normal 2.08, and iron panel showed iron 32 which is low, TIBC 269, ferritin 866 high, iron saturation 11.9 low and transferrin 192 low, CBC with differential showed anemia  11.8/36.4 microcytic with an MCV of 77.  Echo 01/22/2021:   1. Left ventricular ejection fraction, by estimation, is 60 to 65%. The  left ventricle has normal function. The left ventricle has no regional  wall motion abnormalities. There is mild left ventricular hypertrophy.  Left ventricular diastolic parameters  were normal.   2. Right ventricular systolic function is normal. The right ventricular  size is normal. There is normal pulmonary artery  systolic pressure.   3. The mitral valve is normal in structure. Trivial mitral valve  regurgitation. No evidence of mitral stenosis.   4. The aortic valve is tricuspid. Aortic valve regurgitation is not  visualized. Aortic valve sclerosis is present, with no evidence of aortic  valve stenosis.   5. Aortic dilatation noted. There is mild dilatation of the ascending  aorta, measuring 43 mm.   6. The inferior vena cava is dilated in size with >50% respiratory  variability, suggesting right atrial pressure of 8 mmHg.   Comparison(s): No prior Echocardiogram.      Latest Ref Rng & Units 04/28/2022   12:04 PM 07/07/2021    7:06 AM 01/01/2021    2:50 AM  BMP  Glucose 70 - 99 mg/dL 94  161  98   BUN 8 - 23 mg/dL 16  19  13    Creatinine 0.61 - 1.24 mg/dL 0.96  0.45  4.09   Sodium 135 - 145 mmol/L 137  140  137   Potassium 3.5 - 5.1 mmol/L 4.1  4.5  3.9   Chloride 98 - 111 mmol/L 107  109  106   CO2 22 - 32 mmol/L 22  23  23    Calcium 8.9 - 10.3 mg/dL 8.9  9.3  9.0    Review of Systems: Patient complains of symptoms per HPI as well as the following symptoms low back pain. Pertinent negatives and positives per HPI. All others negative.   Social History   Socioeconomic History   Marital status: Single    Spouse name: Not on file   Number of children: Not on file   Years of education: Not on file   Highest education level: Not on file  Occupational History   Not on file  Tobacco Use   Smoking status: Former    Types: Cigarettes    Quit date: 1970    Years since quitting: 54.3   Smokeless tobacco: Never   Tobacco comments:    Quit smoking in 1970's; up until that time, 1 pack would last him 1 month.   Substance and Sexual Activity   Alcohol use: Not Currently   Drug use: No   Sexual activity: Not on file  Other Topics Concern   Not on file  Social History Narrative   Not on file   Social Determinants of Health   Financial Resource Strain: Low Risk  (04/28/2022)   Overall  Financial Resource Strain (CARDIA)    Difficulty of Paying Living Expenses: Not very hard  Food Insecurity: No Food Insecurity (04/28/2022)   Hunger Vital Sign    Worried About Running Out of Food in the Last Year: Never true    Ran Out of Food in the Last Year: Never true  Transportation Needs: No Transportation Needs (04/28/2022)   PRAPARE - Administrator, Civil Service (Medical): No    Lack of Transportation (Non-Medical): No  Physical Activity: Not on file  Stress: No Stress Concern Present (04/28/2022)   [employer] of Occupational Health - Occupational Stress  Questionnaire    Feeling of Stress : Only a little  Social Connections: Not on file  Intimate Partner Violence: Not At Risk (04/28/2022)   Humiliation, Afraid, Rape, and Kick questionnaire    Fear of Current or Ex-Partner: No    Emotionally Abused: No    Physically Abused: No    Sexually Abused: No    Family History  Problem Relation Age of Onset   Kidney disease Mother    Heart attack Father    Cancer Sister    Cancer Sister    Cancer Brother    Stroke Neg Hx     Past Medical History:  Diagnosis Date   Anxiety    Depression    Hypercholesteremia    Hypertension     Patient Active Problem List   Diagnosis Date Noted   Lytic lesion of bone on x-ray 04/28/2022   Normocytic anemia 04/28/2022   Stroke (cerebrum) 01/02/2021   Hypertension    Hypercholesteremia    Anxiety    BPH (benign prostatic hyperplasia)    GERD (gastroesophageal reflux disease)    CVA (cerebral vascular accident) 12/31/2020   Vitamin D insufficiency 07/04/2014   Hypocalcemia 07/02/2014   Prediabetes 07/02/2014   AKI (acute kidney injury) 07/02/2014   Other pancytopenia    Hemoperitoneum    Thrombocytopenia    Splenomegaly 07/01/2014   Spleen injury 07/01/2014    Past Surgical History:  Procedure Laterality Date   cervical fusion     IR ANGIO INTRA EXTRACRAN SEL INTERNAL CAROTID BILAT MOD SED  01/02/2021   IR  ANGIO VERTEBRAL SEL SUBCLAVIAN INNOMINATE UNI L MOD SED  01/02/2021   IR ANGIO VERTEBRAL SEL SUBCLAVIAN INNOMINATE UNI R MOD SED  07/07/2021   IR ANGIO VERTEBRAL SEL VERTEBRAL UNI R MOD SED  01/02/2021   IR CT HEAD LTD  01/02/2021   IR INTRA CRAN STENT  01/02/2021   IR NEURO EACH ADD'L AFTER BASIC UNI RIGHT (MS)  01/02/2021   IR US GUIDE VASC ACCESS RIGHT  07/07/2021   RADIOLOGY WITH ANESTHESIA N/A 01/02/2021   Procedure: IR WITH ANESTHESIA;  Surgeon: Baldemar Lenis, MD;  Location: [facility] OR;  Service: Radiology;  Laterality: N/A;   SHOULDER SURGERY Left 1990   WRIST FRACTURE SURGERY      Current Outpatient Medications  Medication Sig Dispense Refill   acetaminophen (TYLENOL) 500 MG tablet Take 1,000 mg by mouth every 6 (six) hours as needed for moderate pain.     aspirin 325 MG tablet Take 325 mg by mouth daily.     Cholecalciferol (VITAMIN D) 50 MCG (2000 UT) tablet Take 4,000 Units by mouth daily.     ferrous sulfate 325 (65 FE) MG tablet Take 325 mg by mouth daily as needed (energy).     fluorouracil (EFUDEX) 5 % cream Apply 1 application  topically daily as needed. Skin basil cell areas as directed     hydroxypropyl methylcellulose (ISOPTO TEARS) 2.5 % ophthalmic solution Place 2 drops into both eyes 2 (two) times daily as needed for dry eyes.     losartan (COZAAR) 50 MG tablet Take 50 mg by mouth daily.     methocarbamol (ROBAXIN) 500 MG tablet TAKE ONE TABLET BY MOUTH FOUR TIMES A DAY FOR MUSCLE PAIN     omeprazole (PRILOSEC) 20 MG capsule Take 20 mg by mouth daily as needed (acid reflux).     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     sertraline (ZOLOFT) 100 MG tablet Take 100 mg by mouth 2 (two) times daily.     tamsulosin (FLOMAX) 0.4 MG CAPS capsule Take 0.4 mg by mouth at bedtime.     terbinafine (LAMISIL) 1 % cream Apply 1 application  topically 2  (two) times daily as needed (itching).     traMADol (ULTRAM) 50 MG tablet Take 1 tablet (50 mg total) by mouth every 6 (six) hours as needed. 30 tablet 0   No current facility-administered medications for this visit.    Allergies as of 05/18/2022 - Review Complete 05/18/2022  Allergen Reaction Noted   Alendronate sodium  08/04/2011   Nitroglycerin Other (See Comments) 07/07/2021   Simvastatin  09/04/2004   Other Rash 01/02/2021   Penicillins Itching and Rash 06/26/2013    Vitals: BP (!) 118/59   Pulse 70   Ht 5\' 10"  (1.778 m)   Wt 171 lb 6.4 oz (77.7 kg)   BMI 24.59 kg/m  Last Weight:  Wt Readings from Last 1 Encounters:  05/18/22 171 lb 6.4 oz (77.7 kg)   Last Height:   Ht Readings from Last 1 Encounters:  05/18/22 5\' 10"  (1.778 m)     Physical exam: Exam: Gen: NAD, conversant, well nourised, obese, well groomed                     CV: RRR, no MRG. No Carotid Bruits. No peripheral edema, warm, nontender Eyes: Conjunctivae clear without exudates or hemorrhage  Neuro: Detailed Neurologic Exam  Speech:    Speech is normal; fluent and spontaneous with normal comprehension.  Cognition:    The patient is oriented to person, place, and time;     recent and remote memory intact;     language fluent;     normal attention, concentration,     fund of knowledge Cranial Nerves:    The pupils are equal, round, and reactive to light. Attempted, pupils too small to visualize. Visual fields are full to finger confrontation. Extraocular movements are intact. Trigeminal sensation is intact and the muscles of mastication are normal. The face is symmetric. The palate elevates in the midline. Hearing intact. Voice is normal. Shoulder shrug is normal. The tongue has normal motion without fasciculations.   Coordination:    No dysmtria noted  Gait:    Antalgic(wide-spread mets to pelvis)  Motor Observation:    No asymmetry, no atrophy, and no involuntary movements noted. Tone:     Normal muscle tone.    Posture:    Posture is stooped    Strength:    Strength is V/V in the upper and lower limbs.      Sensation: intact to LT     Reflex Exam:  DTR's:    Ajs 2+ with distraction, patellas brisk, right uppers slightly more brisk but no clonus Toes:    The toes are upgoing right, left equiv bilaterally.   Clonus:    Clonus is absent.    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, the partially visualized lower lumbar spine and the proximal femurs, the lytic lesion on recent bone survey correlates with a destructive inferior pubic ramus metastasis with associated soft tissue mass which measures 5.2 x 4.2 x 5 cm, extensive involvement of the left femoral neck which is likely at risk for pathologic fracture highly suspicious for prostate carcinoma  Dysphagia: swallow study, recent pneumonia 2 months ago, need to evaluate for aspiration Numb hands in the morning: likely CTS, + Mcphalens but will order MRI brain  Used to have bad migraines, "sqiggly lines" may be aura unlikely stroke   Orders Placed This Encounter  Procedures   MR BRAIN WO CONTRAST   DG OP Swallowing Func-Medicare/Speech Path   SLP modified barium swallow   No orders of the defined types were placed in this encounter.   Cc: No ref. provider found,  No primary care provider on file.  Naomie Dean, MD  [facility] [address] [place], [place] [address]  Phone [phone] Fax [phone]  I spent 79 minutes of face-to-face and non-face-to-face time with patient on the  1. Vision changes   2. Numbness of hand   3. Dysphagia, unspecified type   4. Prostate cancer   5. Metastatic malignant neoplasm, unspecified site   6. Hx of arterial ischemic stroke    diagnosis.  This included previsit chart review, lab review, study review, order entry, electronic health  record documentation, patient education on the different diagnostic and therapeutic options, counseling and coordination of care, risks and benefits of management, compliance, or risk factor reduction

## 2022-05-18 NOTE — Patient Instructions (Addendum)
Swallow study MRI of the brain w/wo contrast Wear wrist splints at bedtime [employer] or [employer] or [facility] or [facility]  Medical supply store [address]  [phone]  Carpal Tunnel Syndrome  Carpal tunnel syndrome is a condition that causes pain, numbness, and weakness in your hand and fingers. The carpal tunnel is a narrow area located on the palm side of your wrist. Repeated wrist motion or certain diseases may cause swelling within the tunnel. This swelling pinches the main nerve in the wrist. The main nerve in the wrist is called the median nerve. What are the causes? This condition may be caused by: Repeated and forceful wrist and hand motions. Wrist injuries. Arthritis. A cyst or tumor in the carpal tunnel. Fluid buildup during pregnancy. Use of tools that vibrate. Sometimes the cause of this condition is not known. What increases the risk? The following factors may make you more likely to develop this condition: Having a job that requires you to repeatedly or forcefully move your wrist or hand or requires you to use tools that vibrate. This may include jobs that involve using computers, working on an [employer], or working with power tools such as Radiographer, therapeutic. Being a woman. Having certain conditions, such as: Diabetes. Obesity. An underactive thyroid (hypothyroidism). Kidney failure. Rheumatoid arthritis. What are the signs or symptoms? Symptoms of this condition include: A tingling feeling in your fingers, especially in your thumb, index, and middle fingers. Tingling or numbness in your hand. An aching feeling in your entire arm, especially when your wrist and elbow are bent for a long time. Wrist pain that goes up your arm to your shoulder. Pain that goes down into your palm or fingers. A weak feeling in your hands. You may have trouble grabbing and holding items. Your symptoms may feel worse during the night. How is this  diagnosed? This condition is diagnosed with a medical history and physical exam. You may also have tests, including: Electromyogram (EMG). This test measures electrical signals sent by your nerves into the muscles. Nerve conduction study. This test measures how well electrical signals pass through your nerves. Imaging tests, such as X-rays, ultrasound, and MRI. These tests check for possible causes of your condition. How is this treated? This condition may be treated with: Lifestyle changes. It is important to stop or change the activity that caused your condition. Doing exercise and activities to strengthen and stretch your muscles and tendons (physical therapy). Making lifestyle changes to help with your condition and learning how to do your daily activities safely (occupational therapy). Medicines for pain and inflammation. This may include medicine that is injected into your wrist. A wrist splint or brace. Surgery. Follow these instructions at home: If you have a splint or brace: Wear the splint or brace as told by your health care provider. Remove it only as told by your health care provider. Loosen the splint or brace if your fingers tingle, become numb, or turn cold and blue. Keep the splint or brace clean. If the splint or brace is not waterproof: Do not let it get wet. Cover it with a watertight covering when you take a bath or shower. Managing pain, stiffness, and swelling If directed, put ice on the painful area. To do this: If you have a removeable splint or brace, remove it as told by your health care provider. Put ice in a plastic bag. Place a towel between your skin and the bag or between the  splint or brace and the bag. Leave the ice on for 20 minutes, 2-3 times a day. Do not fall asleep with the cold pack on your skin. Remove the ice if your skin turns bright red. This is very important. If you cannot feel pain, heat, or cold, you have a greater risk of damage to the  area. Move your fingers often to reduce stiffness and swelling. General instructions Take over-the-counter and prescription medicines only as told by your health care provider. Rest your wrist and hand from any activity that may be causing your pain. If your condition is work related, talk with your employer about changes that can be made, such as getting a wrist pad to use while typing. Do any exercises as told by your health care provider, physical therapist, or occupational therapist. Keep all follow-up visits. This is important. Contact a health care provider if: You have new symptoms. Your pain is not controlled with medicines. Your symptoms get worse. Get help right away if: You have severe numbness or tingling in your wrist or hand. Summary Carpal tunnel syndrome is a condition that causes pain, numbness, and weakness in your hand and fingers. It is usually caused by repeated wrist motions. Lifestyle changes and medicines are used to treat carpal tunnel syndrome. Surgery may be recommended. Follow your health care provider's instructions about wearing a splint, resting from activity, keeping follow-up visits, and calling for help. This information is not intended to replace advice given to you by your health care provider. Make sure you discuss any questions you have with your health care provider.  Stroke Prevention Some medical conditions and behaviors can lead to a higher chance of having a stroke. You can help prevent a stroke by eating healthy, exercising, not smoking, and managing any medical conditions you have. Stroke is a leading cause of functional impairment. Primary prevention is particularly important because a majority of strokes are first-time events. Stroke changes the lives of not only those who experience a stroke but also their family and other caregivers. How can this condition affect me? A stroke is a medical emergency and should be treated right away. A stroke can  lead to brain damage and can sometimes be life-threatening. If a person gets medical treatment right away, there is a better chance of surviving and recovering from a stroke. What can increase my risk? The following medical conditions may increase your risk of a stroke: Cardiovascular disease. High blood pressure (hypertension). Diabetes. High cholesterol. Sickle cell disease. Blood clotting disorders (hypercoagulable state). Obesity. Sleep disorders (obstructive sleep apnea). Other risk factors include: Being older than age 60. Having a history of blood clots, stroke, or mini-stroke (transient ischemic attack, TIA). Genetic factors, such as race, ethnicity, or a family history of stroke. Smoking cigarettes or using other tobacco products. Taking birth control pills, especially if you also use tobacco. Heavy use of alcohol or drugs, especially cocaine and methamphetamine. Physical inactivity. What actions can I take to prevent this? Manage your health conditions High cholesterol levels. Eating a healthy diet is important for preventing high cholesterol. If cholesterol cannot be managed through diet alone, you may need to take medicines. Take any prescribed medicines to control your cholesterol as told by your health care provider. Hypertension. To reduce your risk of stroke, try to keep your blood pressure below 130/80. Eating a healthy diet and exercising regularly are important for controlling blood pressure. If these steps are not enough to manage your blood pressure, you may need to  take medicines. Take any prescribed medicines to control hypertension as told by your health care provider. Ask your health care provider if you should monitor your blood pressure at home. Have your blood pressure checked every year, even if your blood pressure is normal. Blood pressure increases with age and some medical conditions. Diabetes. Eating a healthy diet and exercising regularly are important  parts of managing your blood sugar (glucose). If your blood sugar cannot be managed through diet and exercise, you may need to take medicines. Take any prescribed medicines to control your diabetes as told by your health care provider. Get evaluated for obstructive sleep apnea. Talk to your health care provider about getting a sleep evaluation if you snore a lot or have excessive sleepiness. Make sure that any other medical conditions you have, such as atrial fibrillation or atherosclerosis, are managed. Nutrition Follow instructions from your health care provider about what to eat or drink to help manage your health condition. These instructions may include: Reducing your daily calorie intake. Limiting how much salt (sodium) you use to 1,500 milligrams (mg) each day. Using only healthy fats for cooking, such as olive oil, canola oil, or sunflower oil. Eating healthy foods. You can do this by: Choosing foods that are high in fiber, such as whole grains, and fresh fruits and vegetables. Eating at least 5 servings of fruits and vegetables a day. Try to fill one-half of your plate with fruits and vegetables at each meal. Choosing lean protein foods, such as lean cuts of meat, poultry without skin, fish, tofu, beans, and nuts. Eating low-fat dairy products. Avoiding foods that are high in sodium. This can help lower blood pressure. Avoiding foods that have saturated fat, trans fat, and cholesterol. This can help prevent high cholesterol. Avoiding processed and prepared foods. Counting your daily carbohydrate intake.  Lifestyle If you drink alcohol: Limit how much you have to: 0-1 drink a day for women who are not pregnant. 0-2 drinks a day for men. Know how much alcohol is in your drink. In the [place] one drink equals one 12 oz bottle of beer ( ), one 5 oz glass of wine ( ), or one 1 oz glass of hard liquor (44mL). Do not use any products that contain nicotine or tobacco. These products  include cigarettes, chewing tobacco, and vaping devices, such as e-cigarettes. If you need help quitting, ask your health care provider. Avoid secondhand smoke. Do not use drugs. Activity  Try to stay at a healthy weight. Get at least 30 minutes of exercise on most days, such as: Fast walking. Biking. Swimming. Medicines Take over-the-counter and prescription medicines only as told by your health care provider. Aspirin or blood thinners (antiplatelets or anticoagulants) may be recommended to reduce your risk of forming blood clots that can lead to stroke. Avoid taking birth control pills. Talk to your health care provider about the risks of taking birth control pills if: You are over 45 years old. You smoke. You get very bad headaches. You have had a blood clot. Where to find more information American Stroke Association: www.strokeassociation.org Get help right away if: You or a loved one has any symptoms of a stroke. "BE FAST" is an easy way to remember the main warning signs of a stroke: B - Balance. Signs are dizziness, sudden trouble walking, or loss of balance. E - Eyes. Signs are trouble seeing or a sudden change in vision. F - Face. Signs are sudden weakness or numbness of the face, or the face  or eyelid drooping on one side. A - Arms. Signs are weakness or numbness in an arm. This happens suddenly and usually on one side of the body. S - Speech. Signs are sudden trouble speaking, slurred speech, or trouble understanding what people say. T - Time. Time to call emergency services. Write down what time symptoms started. You or a loved one has other signs of a stroke, such as: A sudden, severe headache with no known cause. Nausea or vomiting. Seizure. These symptoms may represent a serious problem that is an emergency. Do not wait to see if the symptoms will go away. Get medical help right away. Call your local emergency services (911 in the [place] Do not drive yourself to the  hospital. Summary You can help to prevent a stroke by eating healthy, exercising, not smoking, limiting alcohol intake, and managing any medical conditions you may have. Do not use any products that contain nicotine or tobacco. These include cigarettes, chewing tobacco, and vaping devices, such as e-cigarettes. If you need help quitting, ask your health care provider. Remember "BE FAST" for warning signs of a stroke. Get help right away if you or a loved one has any of these signs. This information is not intended to replace advice given to you by your health care provider. Make sure you discuss any questions you have with your health care provider. Document Revised: 08/02/2019 Document Reviewed: 08/20/2019 Elsevier Patient Education  2023 Elsevier Inc.  Document Revised: 05/31/2019 Document Reviewed: 05/31/2019 Elsevier Patient Education  2023 [employer].

## 2022-05-19 ENCOUNTER — Inpatient Hospital Stay: Payer: Medicare PPO

## 2022-05-21 ENCOUNTER — Encounter: Payer: Self-pay | Admitting: Neurology

## 2022-05-23 ENCOUNTER — Emergency Department (HOSPITAL_COMMUNITY): Payer: No Typology Code available for payment source

## 2022-05-23 ENCOUNTER — Encounter (HOSPITAL_COMMUNITY): Payer: Self-pay

## 2022-05-23 ENCOUNTER — Emergency Department (HOSPITAL_COMMUNITY)
Admission: EM | Admit: 2022-05-23 | Discharge: 2022-05-23 | Disposition: A | Discharge status: Home / Self Care | Payer: No Typology Code available for payment source | Attending: Emergency Medicine | Admitting: Emergency Medicine

## 2022-05-23 ENCOUNTER — Other Ambulatory Visit: Payer: Self-pay

## 2022-05-23 DIAGNOSIS — Z8546 Personal history of malignant neoplasm of prostate: Secondary | ICD-10-CM | POA: Insufficient documentation

## 2022-05-23 DIAGNOSIS — R42 Dizziness and giddiness: Secondary | ICD-10-CM | POA: Diagnosis not present

## 2022-05-23 DIAGNOSIS — Z7982 Long term (current) use of aspirin: Secondary | ICD-10-CM | POA: Insufficient documentation

## 2022-05-23 LAB — DIFFERENTIAL
Abs Immature Granulocytes: 0.11 10*3/uL — ABNORMAL HIGH (ref 0.00–0.07)
Basophils Absolute: 0 10*3/uL (ref 0.0–0.1)
Basophils Relative: 0 %
Eosinophils Absolute: 0 10*3/uL (ref 0.0–0.5)
Eosinophils Relative: 1 %
Immature Granulocytes: 2 %
Lymphocytes Relative: 12 %
Lymphs Abs: 0.7 10*3/uL (ref 0.7–4.0)
Monocytes Absolute: 0.4 10*3/uL (ref 0.1–1.0)
Monocytes Relative: 7 %
Neutro Abs: 4.7 10*3/uL (ref 1.7–7.7)
Neutrophils Relative %: 78 %

## 2022-05-23 LAB — I-STAT CHEM 8, ED
BUN: 14 mg/dL (ref 8–23)
BUN: 12 mg/dL (ref 8–23)
Calcium, Ion: 1.15 mmol/L (ref 1.15–1.40)
Calcium, Ion: 1.03 mmol/L — ABNORMAL LOW (ref 1.15–1.40)
Chloride: 103 mmol/L (ref 98–111)
Chloride: 103 mmol/L (ref 98–111)
Creatinine, Ser: 1.1 mg/dL (ref 0.61–1.24)
Creatinine, Ser: 1.1 mg/dL (ref 0.61–1.24)
Glucose, Bld: 118 mg/dL — ABNORMAL HIGH (ref 70–99)
Glucose, Bld: 119 mg/dL — ABNORMAL HIGH (ref 70–99)
HCT: 34 % — ABNORMAL LOW (ref 39.0–52.0)
HCT: 33 % — ABNORMAL LOW (ref 39.0–52.0)
Hemoglobin: 11.6 g/dL — ABNORMAL LOW (ref 13.0–17.0)
Hemoglobin: 11.2 g/dL — ABNORMAL LOW (ref 13.0–17.0)
Potassium: 4.6 mmol/L (ref 3.5–5.1)
Potassium: 4.4 mmol/L (ref 3.5–5.1)
Sodium: 137 mmol/L (ref 135–145)
Sodium: 137 mmol/L (ref 135–145)
TCO2: 27 mmol/L (ref 22–32)
TCO2: 27 mmol/L (ref 22–32)

## 2022-05-23 LAB — COMPREHENSIVE METABOLIC PANEL
ALT: 27 U/L (ref 0–44)
AST: 36 U/L (ref 15–41)
Albumin: 3.4 g/dL — ABNORMAL LOW (ref 3.5–5.0)
Alkaline Phosphatase: 601 U/L — ABNORMAL HIGH (ref 38–126)
Anion gap: 8 (ref 5–15)
BUN: 11 mg/dL (ref 8–23)
CO2: 24 mmol/L (ref 22–32)
Calcium: 8.5 mg/dL — ABNORMAL LOW (ref 8.9–10.3)
Chloride: 103 mmol/L (ref 98–111)
Creatinine, Ser: 1.07 mg/dL (ref 0.61–1.24)
GFR, Estimated: >60 mL/min (ref >60)
Glucose, Bld: 120 mg/dL — ABNORMAL HIGH (ref 70–99)
Potassium: 4.1 mmol/L (ref 3.5–5.1)
Sodium: 135 mmol/L (ref 135–145)
Total Bilirubin: 1.3 mg/dL — ABNORMAL HIGH (ref 0.3–1.2)
Total Protein: 6.3 g/dL — ABNORMAL LOW (ref 6.5–8.1)

## 2022-05-23 LAB — PROTIME-INR
INR: 1.2 (ref 0.8–1.2)
Prothrombin Time: 15 seconds (ref 11.4–15.2)

## 2022-05-23 LAB — RAPID URINE DRUG SCREEN, HOSP PERFORMED
Amphetamines: NOT DETECTED
Barbiturates: NOT DETECTED
Benzodiazepines: NOT DETECTED
Cocaine: NOT DETECTED
Opiates: POSITIVE — AB
Tetrahydrocannabinol: NOT DETECTED

## 2022-05-23 LAB — URINALYSIS, ROUTINE W REFLEX MICROSCOPIC
Bilirubin Urine: NEGATIVE
Glucose, UA: NEGATIVE mg/dL
Ketones, ur: NEGATIVE mg/dL
Leukocytes,Ua: NEGATIVE
Nitrite: NEGATIVE
Protein, ur: NEGATIVE mg/dL
RBC / HPF: >50 RBC/hpf (ref 0–5)
Specific Gravity, Urine: >1.046 — ABNORMAL HIGH (ref 1.005–1.030)
pH: 6 (ref 5.0–8.0)

## 2022-05-23 LAB — CBC
HCT: 34.5 % — ABNORMAL LOW (ref 39.0–52.0)
Hemoglobin: 10.9 g/dL — ABNORMAL LOW (ref 13.0–17.0)
MCH: 25 pg — ABNORMAL LOW (ref 26.0–34.0)
MCHC: 31.6 g/dL (ref 30.0–36.0)
MCV: 79.1 fL — ABNORMAL LOW (ref 80.0–100.0)
Platelets: 114 10*3/uL — ABNORMAL LOW (ref 150–400)
RBC: 4.36 MIL/uL (ref 4.22–5.81)
RDW: 14.6 % (ref 11.5–15.5)
WBC: 6 10*3/uL (ref 4.0–10.5)
nRBC: 0 % (ref 0.0–0.2)

## 2022-05-23 LAB — APTT: aPTT: 34 seconds (ref 24–36)

## 2022-05-23 LAB — CBG MONITORING, ED: Glucose-Capillary: 124 mg/dL — ABNORMAL HIGH (ref 70–99)

## 2022-05-23 MED ORDER — ONDANSETRON HCL 4 MG/2ML IJ SOLN
4.0000 mg | Freq: Once | INTRAMUSCULAR | Status: AC
  Administered 2022-05-23: 4 mg via INTRAVENOUS
  Filled 2022-05-23: qty 2

## 2022-05-23 MED ORDER — IOHEXOL 350 MG/ML SOLN
75.0000 mL | Freq: Once | INTRAVENOUS | Status: AC | PRN
  Administered 2022-05-23: 75 mL via INTRAVENOUS

## 2022-05-23 MED ORDER — SODIUM CHLORIDE 0.9% FLUSH
3.0000 mL | Freq: Once | INTRAVENOUS | Status: AC
  Administered 2022-05-23: 3 mL via INTRAVENOUS

## 2022-05-23 MED ORDER — SODIUM CHLORIDE 0.9 % IV BOLUS
1000.0000 mL | Freq: Once | INTRAVENOUS | Status: AC
  Administered 2022-05-23: 1000 mL via INTRAVENOUS

## 2022-05-23 NOTE — ED Notes (Signed)
CODE STROKE CANCELLED 

## 2022-05-23 NOTE — ED Provider Notes (Addendum)
[facility]PT [facility] Emergency Department Provider Note MRN:  [id number]  Arrival date & time: 05/23/22     Chief Complaint   Stroke Symptoms  History of Present Illness   Kevin Santiago is a 82 y.o. year-old male presents to the ED with chief complaint of facial droop and leg weakness.  States that he went to bed in normal state of health at 10pm.   LKW time is 10pm.  Woke up with dizziness.  Proceeded to call EMS, then had facial droop and weakness.  Activated as code stroke and brought to ED.  Hx of prostate cancer.  Has foley cath.  Takes aspirin.  Hx of brain stem stroke.  History provided by patient.   Review of Systems  Pertinent positive and negative review of systems noted in HPI.    Physical Exam   Vitals:   05/23/22 0320 05/23/22 0415  BP: (!) 146/74 136/69  Pulse: 66 64  Resp: 16 14  Temp:    SpO2: 100% 98%    CONSTITUTIONAL:  chronically ill-appearing, NAD NEURO:  Alert and oriented x 3, CN 3-12 grossly intact, speech is clear EYES:  eyes equal and reactive ENT/NECK:  Supple, no stridor  CARDIO:  normal rate, regular rhythm, appears well-perfused  PULM:  No respiratory distress,  GI/GU:  non-distended, foley catheter with dark yellow urine MSK/SPINE:  No gross deformities, no edema, moves all extremities  SKIN:  no rash, atraumatic   *Additional and/or pertinent findings included in MDM below  Diagnostic and Interventional Summary    EKG Interpretation  Date/Time:  Sunday May 23 2022 04:46:30 EDT Ventricular Rate:  66 PR Interval:  140 QRS Duration: 88 QT Interval:  394 QTC Calculation: 413 R Axis:   -41 Text Interpretation: Normal sinus rhythm Left axis deviation Abnormal ECG When compared with ECG of 31-Dec-2020 06:42, PREVIOUS ECG IS PRESENT Confirmed by Marily Memos [phone]) on 05/23/2022 6:17:56 AM       Labs Reviewed  CBC - Abnormal; Notable for the following components:      Result Value   Hemoglobin 10.9 (*)     HCT 34.5 (*)    MCV 79.1 (*)    MCH 25.0 (*)    Platelets 114 (*)    All other components within normal limits  DIFFERENTIAL - Abnormal; Notable for the following components:   Abs Immature Granulocytes 0.11 (*)    All other components within normal limits  COMPREHENSIVE METABOLIC PANEL - Abnormal; Notable for the following components:   Glucose, Bld 120 (*)    Calcium 8.5 (*)    Total Protein 6.3 (*)    Albumin 3.4 (*)    Alkaline Phosphatase 601 (*)    Total Bilirubin 1.3 (*)    All other components within normal limits  RAPID URINE DRUG SCREEN, HOSP PERFORMED - Abnormal; Notable for the following components:   Opiates POSITIVE (*)    All other components within normal limits  URINALYSIS, ROUTINE W REFLEX MICROSCOPIC - Abnormal; Notable for the following components:   Color, Urine AMBER (*)    Specific Gravity, Urine >1.046 (*)    Hgb urine dipstick LARGE (*)    Bacteria, UA RARE (*)    All other components within normal limits  CBG MONITORING, ED - Abnormal; Notable for the following components:   Glucose-Capillary 124 (*)    All other components within normal limits  I-STAT CHEM 8, ED - Abnormal; Notable for the following components:   Glucose, Bld 118 (*)  Hemoglobin 11.6 (*)    HCT 34.0 (*)    All other components within normal limits  PROTIME-INR  APTT  ETHANOL  CBG MONITORING, ED  I-STAT CHEM 8, ED    DG Chest Port 1 View  Final Result    MR BRAIN WO CONTRAST  Final Result    CT ANGIO HEAD NECK W WO CM (CODE STROKE)  Final Result    CT HEAD CODE STROKE WO CONTRAST  Final Result      Medications  sodium chloride flush (NS) 0.9 % injection 3 mL (3 mLs Intravenous Given 05/23/22 0435)  iohexol (OMNIPAQUE) 350 MG/ML injection 75 mL (75 mLs Intravenous Contrast Given 05/23/22 0330)  ondansetron (ZOFRAN) injection 4 mg (4 mg Intravenous Given 05/23/22 0516)  sodium chloride 0.9 % bolus 1,000 mL (1,000 mLs Intravenous New Bag/Given 05/23/22 0517)      Procedures  /  Critical Care Procedures  ED Course and Medical Decision Making  I have reviewed the triage vital signs, the nursing notes, and pertinent available records from the EMR.  Social Determinants Affecting Complexity of Care: Patient has no clinically significant social determinants affecting this chief complaint..   ED Course:    Medical Decision Making Patient here after having episode of dizziness as well as reported weakness and leg and face.  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  Code stroke was activated, but patient is not within the treatment window for TNK.  Advanced imaging studies are reassuring.  Patient cleared by neurology.  On my exam, he is nontoxic, alert and oriented.  He has good follow-up.  His labs are reassuring.  I do not think that he requires further workup or admission to the hospital at this time.  Patient ambulates without assistance.  Amount and/or Complexity of Data Reviewed Labs: ordered. Radiology: ordered.  Risk Prescription drug management.     Consultants: I consulted with Dr. Wilford Corner from neurology.  He states the patient is cleared from neurology standpoint.   Treatment and Plan: I considered admission due to patient's initial presentation, but after considering the examination and diagnostic results, patient will not require admission and can be discharged with outpatient follow-up.    Final Clinical Impressions(s) / ED Diagnoses     ICD-10-CM   1. Dizziness  R42       ED Discharge Orders     None         Discharge Instructions Discussed with and Provided to Patient:     Discharge Instructions      Your emergency department workup was reassuring.  Please follow-up with your regular doctor.  Return for new or worsening symptoms.       Roxy Horseman, PA-C 05/23/22 1610    Roxy Horseman, PA-C 05/23/22 [id number]    Marily Memos, MD 05/23/22 [phone]

## 2022-05-23 NOTE — ED Notes (Signed)
Transported to MRI

## 2022-05-23 NOTE — ED Notes (Signed)
Pt in MRI scanner from [phone]

## 2022-05-23 NOTE — Code Documentation (Signed)
Responded to Code Stroke called at 0259 for R sided facial droop and L sided weakness, [id number] yesterday. Pt arrived at 0304, CBG-124, CT head negative for acute changes, CTA-no LVO. Pt transported to MRI at 0331. MRI negative for stroke. Code Stroke cancelled at 0420.

## 2022-05-23 NOTE — Consult Note (Addendum)
Neurology Consultation  Reason for Consult: Code stroke Referring Physician: Dr. Clayborne Dana  CC: Right face and left leg weakness, dizziness  History is obtained from: Patient, chart  HPI: Kevin Santiago is a 82 y.o. 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 He went to bed at 10 PM on 05/22/2022 and woke up somewhere around 1 AM today with complaints of dizziness.  He called EMS who evaluated him and wanted to bring him to the emergency room for further evaluation.  He initially walked to the gurney but then on the way to the hospital, had slight right facial weakness and left leg weakness which was new for him.  A code stroke was activated due to the sudden change of his neurological symptoms. Evaluated emergently in the ER.  Detailed exam below.  LKW: 2200 05/22/2022 IV thrombolysis given?: no, outside the window at the time of presentation EVT: No-no LVO Premorbid modified Rankin scale (mRS):2   ROS: Full ROS was performed and is negative except as noted in the HPI.   Past Medical History:  Diagnosis Date   Anxiety    Depression    Hypercholesteremia    Hypertension      Family History  Problem Relation Age of Onset   Kidney disease Mother    Heart attack Father    Cancer Sister    Cancer Sister    Cancer Brother    Stroke Neg Hx      Social History:   reports that he quit smoking about 54 years ago. His smoking use included cigarettes. He has never used smokeless tobacco. He reports that he does not  currently use alcohol. He reports that he does not use drugs.  Medications  Current Facility-Administered Medications:    sodium chloride flush (NS) 0.9 % injection 3 mL, 3 mL, Intravenous, Once, [place]ner, Barbara Cower, MD  Current Outpatient Medications:    acetaminophen (TYLENOL) 500 MG tablet, Take 1,000 mg by mouth every 6 (six) hours as needed for moderate pain., Disp: , Rfl:    aspirin 325 MG tablet, Take 325 mg by mouth daily., Disp: , Rfl:    Cholecalciferol (VITAMIN D) 50 MCG (2000 UT) tablet, Take 4,000 Units by mouth daily., Disp: , Rfl:    ferrous sulfate 325 (65 FE) MG tablet, Take 325 mg by mouth daily as needed (energy)., Disp: , Rfl:    fluorouracil (EFUDEX) 5 % cream, Apply 1 application  topically daily as needed. Skin basil cell areas as directed, Disp: , Rfl:    hydroxypropyl methylcellulose (ISOPTO TEARS) 2.5 % ophthalmic solution, Place 2 drops into both eyes 2 (two) times daily as needed for dry eyes., Disp: , Rfl:    losartan (COZAAR) 50 MG tablet, Take 50 mg by mouth daily., Disp: , Rfl:    methocarbamol (ROBAXIN) 500 MG tablet, TAKE ONE TABLET BY MOUTH FOUR TIMES A DAY FOR MUSCLE PAIN, Disp: , Rfl:    omeprazole (PRILOSEC) 20 MG capsule, Take 20 mg by mouth daily as needed (acid reflux)., Disp: , Rfl:    oxybutynin (DITROPAN-XL) 5 MG 24 hr tablet, TAKE ONE TABLET BY MOUTH DAILY  FOR OVERACTIVE BLADDER, Disp: , Rfl:    pravastatin (PRAVACHOL) 80 MG tablet, Take 80 mg by mouth daily., Disp: , Rfl:    sertraline (ZOLOFT) 100 MG tablet, Take 100 mg by mouth 2 (two) times daily., Disp: , Rfl:    tamsulosin (FLOMAX) 0.4 MG CAPS capsule, Take 0.4 mg by mouth at bedtime., Disp: , Rfl:    terbinafine (LAMISIL) 1 % cream, Apply 1 application  topically 2 (two) times daily as needed (itching)., Disp: , Rfl:    traMADol (ULTRAM) 50 MG tablet, Take 1 tablet (50 mg total) by mouth every 6 (six) hours as needed., Disp: 30 tablet, Rfl: 0   Exam: Current vital signs: BP 136/69   Pulse 64    Temp 99 F (37.2 C) (Oral)   Resp 14   Ht  (1.778 m)   Wt 74.8 kg   SpO2 98%   BMI 23.68 kg/m  Vital signs in last 24 hours: Temp:  [99 F (37.2 C)] 99 F (37.2 C) (04/21 0306) Pulse Rate:  [63-66] 64 (04/21 0415) Resp:  [14-16] 14 (04/21 0415) BP: (136-146)/(69-74) 136/69 (04/21 0415) SpO2:  [97 %-100 %] 98 % (04/21 0415) Weight:  [74.8 kg] 74.8 kg (04/21 0430)  General: Awake alert in no distress HEENT: Normocephalic atraumatic Lungs: Clear Cardiovascular: Regular rhythm Abdomen nondistended nontender Extremities warm well-perfused Neurological exam Mental Status: AA&Ox3  Language: speech is mildly dysarthric.  Naming, repetition, fluency, and comprehension intact. Cranial Nerves: PERRL EOMI but says he sees doulble when looking straight, no diplopia looking either direction. visual fields full, right lowe facial weakness at rest - subtle NLF flattening, facial sensation intact, hearing intact, tongue/uvula/soft palate midline, normal sternocleidomastoid and trapezius muscle strength. No evidence of tongue atrophy or fibrillations Motor: B/L UE with no drift. LLE with 1-2/5 strength. RLE full strength no drift/ Tone: is normal and bulk is normal Sensation- Intact to light touch bilaterally Coordination: FTN intact bilaterally Gait- deferred  NIHSS-6  Labs I have reviewed labs in epic and the results pertinent to this consultation are:  CBC    Component Value Date/Time   WBC 6.0 05/23/2022 0309   RBC 4.36 05/23/2022 0309   HGB 11.6 (L) 05/23/2022 0327   HCT 34.0 (L) 05/23/2022 0327   PLT 114 (L) 05/23/2022 0309   MCV 79.1 (L) 05/23/2022 0309   MCH 25.0 (L) 05/23/2022 0309   MCHC 31.6 05/23/2022 0309   RDW 14.6 05/23/2022 0309   LYMPHSABS 0.7 05/23/2022 0309   MONOABS 0.4 05/23/2022 0309   EOSABS 0.0 05/23/2022 0309   BASOSABS 0.0 05/23/2022 0309    CMP     Component Value Date/Time   NA 137 05/23/2022 0327   K 4.6 05/23/2022 0327   CL 103  05/23/2022 0327   CO2 24 05/23/2022 0309   GLUCOSE 118 (H) 05/23/2022 0327   BUN 14 05/23/2022 0327   CREATININE 1.10 05/23/2022 0327   CREATININE 1.14 04/28/2022 1204   CALCIUM 8.5 (L) 05/23/2022 0309   PROT 6.3 (L) 05/23/2022 0309   ALBUMIN 3.4 (L) 05/23/2022 0309   AST 36 05/23/2022 0309   ALT 27 05/23/2022 0309   ALKPHOS 601 (H) 05/23/2022 0309   BILITOT 1.3 (H) 05/23/2022 0309   GFRNONAA >60 05/23/2022 0309   GFRNONAA >60 04/28/2022 1204   GFRAA >60 07/03/2014 0708    Imaging I have reviewed the images obtained:  CT-head- ASPECTS 10 CTA Head and neck - patent Left vert and basilar stent. Stenotic Rt vert. No  ELVO  MRI examination of the brain: No evidence of acute stroke.  Chronic microvascular ischemic change.  Encephalomalacia in the left pons from the prior stroke.  Assessment: 82 year old with pretty complex medical history that includes left pontine stroke with some residual visual symptoms-unclear if they are related to the stroke or not, bony metastatic from sarcoma and possible prostate cancer, recent Foley catheter insertion, right vertebral stent and basilar artery stent in 2022-presented to the emergency room for evaluation of symptoms of dizziness, which was noted upon waking up at 1 AM.  Last known well was 10 PM when he went to bed last night.  For EMS, he had a subtle right facial weakness and left leg weakness which was noticed after he had been transferred to the EMS bed for which a code stroke was activated When I examined him, he was outside the window for IV thrombolysis but given his vertebrobasilar stenting, I took him for a stat CTA and a stat MRI as well to rule out a brainstem infarct. CTA did not demonstrate any occlusion-stents are patent. MRI of the brain was negative.  Symptoms likely related to recrudescence of old symptoms  Impression: Likely recrudescence of old stroke symptoms  Recommendations: Routine labs unremarkable Check UA chest x-ray No  need for any other inpatient neurological workup at this time. If he remains dizzy, may need observation and echocardiogram evaluate for cardiac causes for dizziness. He does not exhibit any imaging findings consistent with vertebrobasilar insufficiency at this time.  Plan was discussed with Ivar Drape, PA-C  -- Milon Dikes, MD Neurologist [facility] Pager: [phone]

## 2022-05-23 NOTE — Discharge Instructions (Signed)
Your emergency department workup was reassuring.  Please follow-up with your regular doctor.  Return for new or worsening symptoms.

## 2022-05-23 NOTE — ED Notes (Signed)
Pt transported to CT / Labs sent

## 2022-05-23 NOTE — ED Triage Notes (Signed)
Pt went to sleep @ 2200 last night and woke up at 0100 when he noticed he had dizziness.  On the way to hospital with [facility] they noticed right facial drop with Left sided weakness.

## 2022-05-25 ENCOUNTER — Telehealth: Payer: Self-pay | Admitting: Neurology

## 2022-05-25 NOTE — Telephone Encounter (Signed)
Patient went to the ED on 4/21 and got a MRI brain without contrast.

## 2022-05-27 ENCOUNTER — Ambulatory Visit (HOSPITAL_COMMUNITY)
Admission: RE | Admit: 2022-05-27 | Discharge: 2022-05-27 | Disposition: A | Discharge status: Home / Self Care | Payer: No Typology Code available for payment source | Source: Ambulatory Visit

## 2022-05-27 ENCOUNTER — Ambulatory Visit (HOSPITAL_COMMUNITY): Payer: No Typology Code available for payment source

## 2022-05-27 DIAGNOSIS — R1310 Dysphagia, unspecified: Secondary | ICD-10-CM

## 2022-06-02 ENCOUNTER — Inpatient Hospital Stay: Payer: Medicare PPO | Attending: Oncology | Admitting: Hospice and Palliative Medicine

## 2022-06-02 DIAGNOSIS — C779 Secondary and unspecified malignant neoplasm of lymph node, unspecified: Secondary | ICD-10-CM | POA: Insufficient documentation

## 2022-06-02 DIAGNOSIS — Z880 Allergy status to penicillin: Secondary | ICD-10-CM | POA: Insufficient documentation

## 2022-06-02 DIAGNOSIS — Z803 Family history of malignant neoplasm of breast: Secondary | ICD-10-CM | POA: Insufficient documentation

## 2022-06-02 DIAGNOSIS — Z79899 Other long term (current) drug therapy: Secondary | ICD-10-CM | POA: Insufficient documentation

## 2022-06-02 DIAGNOSIS — R590 Localized enlarged lymph nodes: Secondary | ICD-10-CM | POA: Insufficient documentation

## 2022-06-02 DIAGNOSIS — M899 Disorder of bone, unspecified: Secondary | ICD-10-CM

## 2022-06-02 DIAGNOSIS — C7951 Secondary malignant neoplasm of bone: Secondary | ICD-10-CM | POA: Insufficient documentation

## 2022-06-02 DIAGNOSIS — Z8249 Family history of ischemic heart disease and other diseases of the circulatory system: Secondary | ICD-10-CM | POA: Insufficient documentation

## 2022-06-02 DIAGNOSIS — C61 Malignant neoplasm of prostate: Secondary | ICD-10-CM | POA: Insufficient documentation

## 2022-06-02 DIAGNOSIS — Z87891 Personal history of nicotine dependence: Secondary | ICD-10-CM | POA: Insufficient documentation

## 2022-06-02 DIAGNOSIS — Z8042 Family history of malignant neoplasm of prostate: Secondary | ICD-10-CM | POA: Insufficient documentation

## 2022-06-02 DIAGNOSIS — R5383 Other fatigue: Secondary | ICD-10-CM | POA: Insufficient documentation

## 2022-06-02 DIAGNOSIS — G8929 Other chronic pain: Secondary | ICD-10-CM | POA: Insufficient documentation

## 2022-06-02 DIAGNOSIS — Z8673 Personal history of transient ischemic attack (TIA), and cerebral infarction without residual deficits: Secondary | ICD-10-CM | POA: Insufficient documentation

## 2022-06-02 DIAGNOSIS — Z7902 Long term (current) use of antithrombotics/antiplatelets: Secondary | ICD-10-CM | POA: Insufficient documentation

## 2022-06-02 DIAGNOSIS — R102 Pelvic and perineal pain: Secondary | ICD-10-CM | POA: Insufficient documentation

## 2022-06-02 NOTE — Progress Notes (Signed)
Multidisciplinary Oncology Council Documentation  Kevin Santiago was presented by our [facility] on 06/02/2022, which included representatives from:  Palliative Care Dietitian  Physical/Occupational Therapist Nurse Navigator Genetics Speech Therapist Social work Survivorship RN Financial Navigator Research RN   Little currently presents with history of bone lesion  We reviewed previous medical and familial history, history of present illness, and recent lab results along with all available histopathologic and imaging studies. The MOC considered available treatment options and made the following recommendations/referrals:  None currently  The MOC is a meeting of clinicians from various specialty areas who evaluate and discuss patients for whom a multidisciplinary approach is being considered. Final determinations in the plan of care are those of the provider(s).   Today's extended care, comprehensive team conference, Kevin Santiago was not present for the discussion and was not examined.

## 2022-06-03 ENCOUNTER — Telehealth: Payer: Self-pay

## 2022-06-03 NOTE — Telephone Encounter (Signed)
Follow up call made to patient. He states that he had biopsy at [facility] and is being followed by [facility] oncology. He states he would still like to have appt with Dr. Cathie Hoops for second opinion.   Please schedule MD only - towards the end of next week. Please inform pt of appointment.

## 2022-06-10 ENCOUNTER — Ambulatory Visit
Admission: RE | Admit: 2022-06-10 | Discharge: 2022-06-10 | Disposition: A | Discharge status: Home / Self Care | Payer: No Typology Code available for payment source | Source: Ambulatory Visit | Attending: Radiation Oncology | Admitting: Radiation Oncology

## 2022-06-10 ENCOUNTER — Inpatient Hospital Stay (HOSPITAL_BASED_OUTPATIENT_CLINIC_OR_DEPARTMENT_OTHER): Payer: No Typology Code available for payment source | Admitting: Oncology

## 2022-06-10 ENCOUNTER — Encounter: Payer: Self-pay | Admitting: Oncology

## 2022-06-10 VITALS — BP 129/73 | HR 62 | Temp 97.3°F | Resp 18 | Wt 168.0 lb

## 2022-06-10 DIAGNOSIS — C801 Malignant (primary) neoplasm, unspecified: Secondary | ICD-10-CM | POA: Insufficient documentation

## 2022-06-10 DIAGNOSIS — C7951 Secondary malignant neoplasm of bone: Secondary | ICD-10-CM

## 2022-06-10 DIAGNOSIS — Z7902 Long term (current) use of antithrombotics/antiplatelets: Secondary | ICD-10-CM | POA: Diagnosis not present

## 2022-06-10 DIAGNOSIS — C779 Secondary and unspecified malignant neoplasm of lymph node, unspecified: Secondary | ICD-10-CM | POA: Diagnosis not present

## 2022-06-10 DIAGNOSIS — Z8673 Personal history of transient ischemic attack (TIA), and cerebral infarction without residual deficits: Secondary | ICD-10-CM | POA: Diagnosis not present

## 2022-06-10 DIAGNOSIS — C61 Malignant neoplasm of prostate: Secondary | ICD-10-CM | POA: Diagnosis present

## 2022-06-10 DIAGNOSIS — Z803 Family history of malignant neoplasm of breast: Secondary | ICD-10-CM | POA: Diagnosis not present

## 2022-06-10 DIAGNOSIS — Z880 Allergy status to penicillin: Secondary | ICD-10-CM | POA: Diagnosis not present

## 2022-06-10 DIAGNOSIS — Z7189 Other specified counseling: Secondary | ICD-10-CM | POA: Insufficient documentation

## 2022-06-10 DIAGNOSIS — R102 Pelvic and perineal pain: Secondary | ICD-10-CM | POA: Diagnosis not present

## 2022-06-10 DIAGNOSIS — Z8042 Family history of malignant neoplasm of prostate: Secondary | ICD-10-CM | POA: Diagnosis not present

## 2022-06-10 DIAGNOSIS — R5383 Other fatigue: Secondary | ICD-10-CM | POA: Diagnosis not present

## 2022-06-10 DIAGNOSIS — G8929 Other chronic pain: Secondary | ICD-10-CM | POA: Diagnosis not present

## 2022-06-10 DIAGNOSIS — Z79899 Other long term (current) drug therapy: Secondary | ICD-10-CM | POA: Diagnosis not present

## 2022-06-10 DIAGNOSIS — R590 Localized enlarged lymph nodes: Secondary | ICD-10-CM | POA: Diagnosis not present

## 2022-06-10 DIAGNOSIS — Z8249 Family history of ischemic heart disease and other diseases of the circulatory system: Secondary | ICD-10-CM | POA: Diagnosis not present

## 2022-06-10 DIAGNOSIS — Z87891 Personal history of nicotine dependence: Secondary | ICD-10-CM | POA: Diagnosis not present

## 2022-06-10 NOTE — Progress Notes (Signed)
Pt her for follow up, he had prostate biopsy at [facility] and is being followed by oncology there, but would like to get opinion from Dr. Cathie Hoops as well. Pt reports that he gets lightheaded when he stands to urinate.

## 2022-06-10 NOTE — Consult Note (Signed)
NEW PATIENT EVALUATION  Name: Kevin Santiago  MRN: [id number]  Date:   06/10/2022     DOB: 1940/05/24   This 82 y.o. male patient presents to the clinic for initial evaluation of stage IV prostate cancer with multiple areas of bony involvement and lytic bone lesions in his pelvis.   REFERRING PHYSICIAN: Carmina Miller, MD  CHIEF COMPLAINT: No chief complaint on file.   DIAGNOSIS: The encounter diagnosis was Metastatic cancer to bone [facility]).   PREVIOUS INVESTIGATIONS:  Plain films MRI scans reviewed bone scan ordered. Labs reviewed Clinical notes reviewed  HPI: Patient is a 82 year old male patient of the [facility] who has been seen both at [facility] and [facility] for widespread metastatic involvement of most likely prostate cancer.  His PSA is 231.  He was having pelvic pain for some time along with bilateral hip pain.  Bone scan performed in March showed lucent lytic lesions within the left inferior pubic ramus at the junction of the ischium and pubic symphysis.  MRI of his pelvis showed metastatic disease throughout the pelvis with lytic lesions with an associated soft tissue mass in the inferior pubic ramus measuring 5.2 cm in greatest dimension.  Also extensive involving the left femoral neck likely at risk for pathologic fracture.  Findings on MRI were highly suspicious for prostate cancer with posterior neurovascular bundle and seminal vesicle involvement.  He complains of lower back hip pain today.  He has been referred to ration collagen for consideration of treatment.  PLANNED TREATMENT REGIMEN: Palliative radiation therapy to his pelvis  PAST MEDICAL HISTORY:  has a past medical history of Anxiety, Depression, Hypercholesteremia, and Hypertension.    PAST SURGICAL HISTORY:  Past Surgical History:  Procedure Laterality Date   cervical fusion     IR ANGIO INTRA EXTRACRAN SEL INTERNAL CAROTID BILAT MOD SED  01/02/2021   IR ANGIO VERTEBRAL SEL SUBCLAVIAN INNOMINATE UNI L MOD SED   01/02/2021   IR ANGIO VERTEBRAL SEL SUBCLAVIAN INNOMINATE UNI R MOD SED  07/07/2021   IR ANGIO VERTEBRAL SEL VERTEBRAL UNI R MOD SED  01/02/2021   IR CT HEAD LTD  01/02/2021   IR INTRA CRAN STENT  01/02/2021   IR NEURO EACH ADD'L AFTER BASIC UNI RIGHT (MS)  01/02/2021   IR US GUIDE VASC ACCESS RIGHT  07/07/2021   RADIOLOGY WITH ANESTHESIA N/A 01/02/2021   Procedure: IR WITH ANESTHESIA;  Surgeon: Baldemar Lenis, MD;  Location: [facility] OR;  Service: Radiology;  Laterality: N/A;   SHOULDER SURGERY Left 1990   WRIST FRACTURE SURGERY      FAMILY HISTORY: family history includes Cancer in his brother, sister, and sister; Heart attack in his father; Kidney disease in his mother.  SOCIAL HISTORY:  reports that he quit smoking about 54 years ago. His smoking use included cigarettes. He has never used smokeless tobacco. He reports that he does not currently use alcohol. He reports that he does not use drugs.  ALLERGIES: Alendronate sodium, Nitroglycerin, Simvastatin, Other, and Penicillins  MEDICATIONS:  Current Outpatient Medications  Medication Sig Dispense Refill   acetaminophen (TYLENOL) 500 MG tablet Take 1,000 mg by mouth every 6 (six) hours as needed for moderate pain.     atorvastatin (LIPITOR) 80 MG tablet Take 80 mg by mouth daily.     Cholecalciferol (VITAMIN D) 50 MCG (2000 UT) tablet Take 4,000 Units by mouth daily.     ferrous sulfate 325 (65 FE) MG tablet Take 325 mg by mouth daily as  needed (energy).     fluorouracil (EFUDEX) 5 % cream Apply 1 application  topically daily as needed. Skin basil cell areas as directed     hydroxypropyl methylcellulose (ISOPTO TEARS) 2.5 % ophthalmic solution Place 2 drops into both eyes 2 (two) times daily as needed for dry eyes.     losartan (COZAAR) 50 MG tablet Take 50 mg by mouth daily.     methocarbamol (ROBAXIN) 500 MG tablet TAKE ONE TABLET BY MOUTH FOUR TIMES A DAY FOR MUSCLE PAIN     omeprazole (PRILOSEC) 20 MG capsule Take 20 mg by mouth  daily as needed (acid reflux).     oxybutynin (DITROPAN-XL) 5 MG 24 hr tablet TAKE ONE TABLET BY MOUTH DAILY FOR OVERACTIVE BLADDER     pravastatin (PRAVACHOL) 80 MG tablet Take 80 mg by mouth daily.     sertraline (ZOLOFT) 100 MG tablet Take 100 mg by mouth 2 (two) times daily.     tamsulosin (FLOMAX) 0.4 MG CAPS capsule Take 0.4 mg by mouth at bedtime.     terbinafine (LAMISIL) 1 % cream Apply 1 application  topically 2 (two) times daily as needed (itching).     ticagrelor (BRILINTA) 90 MG TABS tablet Take 90 mg by mouth 2 (two) times daily.     traMADol (ULTRAM) 50 MG tablet Take 1 tablet (50 mg total) by mouth every 6 (six) hours as needed. 30 tablet 0   No current facility-administered medications for this encounter.    ECOG PERFORMANCE STATUS:  1 - Symptomatic but completely ambulatory  REVIEW OF SYSTEMS: Patient denies any weight loss, fatigue, weakness, fever, chills or night sweats. Patient denies any loss of vision, blurred vision. Patient denies any ringing  of the ears or hearing loss. No irregular heartbeat. Patient denies heart murmur or history of fainting. Patient denies any chest pain or pain radiating to her upper extremities. Patient denies any shortness of breath, difficulty breathing at night, cough or hemoptysis. Patient denies any swelling in the lower legs. Patient denies any nausea vomiting, vomiting of blood, or coffee ground material in the vomitus. Patient denies any stomach pain. Patient states has had normal bowel movements no significant constipation or diarrhea. Patient denies any dysuria, hematuria or significant nocturia. Patient denies any problems walking, swelling in the joints or loss of balance. Patient denies any skin changes, loss of hair or loss of weight. Patient denies any excessive worrying or anxiety or significant depression. Patient denies any problems with insomnia. Patient denies excessive thirst, polyuria, polydipsia. Patient denies any swollen  glands, patient denies easy bruising or easy bleeding. Patient denies any recent infections, allergies or URI. Patient "s visual fields have not changed significantly in recent time.   PHYSICAL EXAM: There were no vitals taken for this visit. Range of motion of his lower extremities does elicit some pain he does have some guarding of his left lower extremity.  Proprioception is intact.  Well-developed well-nourished patient in NAD. HEENT reveals PERLA, EOMI, discs not visualized.  Oral cavity is clear. No oral mucosal lesions are identified. Neck is clear without evidence of cervical or supraclavicular adenopathy. Lungs are clear to A&P. Cardiac examination is essentially unremarkable with regular rate and rhythm without murmur rub or thrill. Abdomen is benign with no organomegaly or masses noted. Motor sensory and DTR levels are equal and symmetric in the upper and lower extremities. Cranial nerves II through XII are grossly intact. Proprioception is intact. No peripheral adenopathy or edema is identified. No motor or  sensory levels are noted. Crude visual fields are within normal range.  LABORATORY DATA labs reviewed    RADIOLOGY RESULTS:MRI scans and plain films reviewed.  Bone scan ordered    IMPRESSION: Widespread metastatic disease from stage IV prostate cancer in 82 year old male  PLAN: At this time I have ordered a bone scan.  Based on the bone scan findings we will target metastatic disease in his pelvis and bilateral hip region.  Would plan on delivering 30 Gray in 10 fractions.  Risks and benefits of treatment clued and possible diarrhea fatigue alteration of blood counts possible increase in lower urinary tract symptoms and skin reaction all were described in detail.  Patient will be set up with a simulation after his bone scan is complete.  Patient comprehends her recommendations well.  I would like to take this opportunity to thank you for allowing me to participate in the care of your  patient.Carmina Miller, MD

## 2022-06-10 NOTE — Assessment & Plan Note (Signed)
Discussed with the patient.  He understands that he is condition is not curable.  Treatment is with palliative intent.

## 2022-06-10 NOTE — Progress Notes (Signed)
Hematology/Oncology Consult Note [phone] [phone]     REFERRING PROVIDER: Eliezer Lofts, MD    CHIEF COMPLAINTS/PURPOSE OF CONSULTATION:  Lytic lesion.   ASSESSMENT & PLAN:   Prostate cancer metastatic to bone [name]) Imaging results and biopsy results were reviewed and discussed with patient. 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 I recommended anticoagulation therapy, which he has already started at the [place]. Systemic options discussed with patient.  Docetaxel q. 3 weeks x 6 +/-androgen receptor pathway inhibitors.  Rationale and potential side effects were reviewed with patient.  Patient prefers androgen receptor pathway inhibitors.  Per patient, his [place] oncologist has mentioned this option to him.  Patient plans to continue his oncology care at the [facility].  [facility]. Patient has appointment with radiation oncology today.  Goals of care, counseling/discussion Discussed with the patient.  He understands that he is condition is not curable.  Treatment is with palliative intent.  Patient is discharged from my clinic. I recommend patient to continue follow [facility] with [facility] for [facility] of [facility] cancer.  He is welcome to reestablish care if he decides to get systemic oncology care locally.  All questions were answered. The patient knows to call the clinic with any problems, questions or concerns.  Rickard Patience, MD, PhD [facility] Hematology Oncology 06/10/2022    HISTORY OF PRESENTING ILLNESS:  Kevin Santiago 82 y.o. male presents to establish care for lytic bone lesion.  Patient reports having chronic pain of his pubic area for a year.  Xray showed lytic destructive lesion involving the left inferior pubic ramus He also has a history of thrombocytopenia, platelet ranges in 82,000-92,000.  He used to drink liquor sometimes,  quitted 5 years ago.  Family history of breast cancer in 2 sisters, 1 brother with prostatae cancer    INTERVAL HISTORY Kevin Santiago is a 82 y.o. male who has above history reviewed by me today presents for follow up visit for second opinion for prostate cancer.  Patient was referred to urology for prostate biopsy.  Patient says that he did not get called by urology office, so [facility].  [facility] also [facility] and will establish care with radiation oncology tomorrow.  He presented for second opinion.  05/11/2022, MRI pelvis with and without contrast showed Diffuse osseous metastatic disease throughout the pelvis, the partially visualized lower lumbar spine, and the proximal femurs. The lytic lesion on recent bone survey correlates with a destructive inferior pubic ramus metastasis with associated soft tissue mass which measures 5.2 x 4.2 x 5.0 cm. Extensive involvement of the left femoral neck, which is likely at risk for pathologic fracture.   Findings are highly suspicious for prostate carcinoma, with posterior neurovascular bundle and seminal vesicle involvement. Bilateral iliac lymphadenopathy, compatible with disease involvement.  05/28/2022, prostate biopsy at the [facility] showed prostatic adenocarcinoma, Gleason score 4+5=9, involving 3 of 4 cores, perineural invasion present. 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  No residual focal weakness.  MEDICAL HISTORY:  Past Medical History:  Diagnosis Date   Anxiety    Depression    Hypercholesteremia    Hypertension     SURGICAL HISTORY: Past Surgical History:  Procedure Laterality Date  cervical fusion     IR ANGIO INTRA EXTRACRAN SEL INTERNAL CAROTID BILAT MOD SED  01/02/2021   IR ANGIO VERTEBRAL SEL SUBCLAVIAN INNOMINATE UNI L MOD SED  01/02/2021   IR ANGIO VERTEBRAL  SEL SUBCLAVIAN INNOMINATE UNI R MOD SED  07/07/2021   IR ANGIO VERTEBRAL SEL VERTEBRAL UNI R MOD SED  01/02/2021   IR CT HEAD LTD  01/02/2021   IR INTRA CRAN STENT  01/02/2021   IR NEURO EACH ADD'L AFTER BASIC UNI RIGHT (MS)  01/02/2021   IR US GUIDE VASC ACCESS RIGHT  07/07/2021   RADIOLOGY WITH ANESTHESIA N/A 01/02/2021   Procedure: IR WITH ANESTHESIA;  Surgeon: Baldemar Lenis, MD;  Location: [facility] OR;  Service: Radiology;  Laterality: N/A;   SHOULDER SURGERY Left 1990   WRIST FRACTURE SURGERY      SOCIAL HISTORY: Social History   Socioeconomic History   Marital status: Single    Spouse name: Not on file   Number of children: Not on file   Years of education: Not on file   Highest education level: Not on file  Occupational History   Not on file  Tobacco Use   Smoking status: Former    Types: Cigarettes    Quit date: 1970    Years since quitting: 54.3   Smokeless tobacco: Never   Tobacco comments:    Quit smoking in 1970's; up until that time, 1 pack would last him 1 month.   Substance and Sexual Activity   Alcohol use: Not Currently   Drug use: No   Sexual activity: Not on file  Other Topics Concern   Not on file  Social History Narrative   Not on file   Social Determinants of Health   Financial Resource Strain: Low Risk  (04/28/2022)   Overall Financial Resource Strain (CARDIA)    Difficulty of Paying Living Expenses: Not very hard  Food Insecurity: No Food Insecurity (04/28/2022)   Hunger Vital Sign    Worried About Running Out of Food in the Last Year: Never true    Ran Out of Food in the Last Year: Never true  Transportation Needs: No Transportation Needs (04/28/2022)   PRAPARE - Administrator, Civil Service (Medical): No    Lack of Transportation (Non-Medical): No  Physical Activity: Not on file  Stress: No Stress Concern Present (04/28/2022)   [employer] of Occupational Health - Occupational Stress Questionnaire    Feeling of Stress :  Only a little  Social Connections: Not on file  Intimate Partner Violence: Not At Risk (04/28/2022)   Humiliation, Afraid, Rape, and Kick questionnaire    Fear of Current or Ex-Partner: No    Emotionally Abused: No    Physically Abused: No    Sexually Abused: No    FAMILY HISTORY: Family History  Problem Relation Age of Onset   Kidney disease Mother    Heart attack Father    Cancer Sister    Cancer Sister    Cancer Brother    Stroke Neg Hx     ALLERGIES:  is allergic to alendronate sodium, nitroglycerin, simvastatin, other, and penicillins.  MEDICATIONS:  Current Outpatient Medications  Medication Sig Dispense Refill   acetaminophen (TYLENOL) 500 MG tablet Take 1,000 mg by mouth every 6 (six) hours as needed for moderate pain.     atorvastatin (LIPITOR) 80 MG tablet Take 80 mg by mouth daily.     Cholecalciferol (VITAMIN D) 50 MCG (2000 UT) tablet Take 4,000  Units by mouth daily.     ferrous sulfate 325 (65 FE) MG tablet Take 325 mg by mouth daily as needed (energy).     fluorouracil (EFUDEX) 5 % cream Apply 1 application  topically daily as needed. Skin basil cell areas as directed     hydroxypropyl methylcellulose (ISOPTO TEARS) 2.5 % ophthalmic solution Place 2 drops into both eyes 2 (two) times daily as needed for dry eyes.     losartan (COZAAR) 50 MG tablet Take 50 mg by mouth daily.     methocarbamol (ROBAXIN) 500 MG tablet TAKE ONE TABLET BY MOUTH FOUR TIMES A DAY FOR MUSCLE PAIN     omeprazole (PRILOSEC) 20 MG capsule Take 20 mg by mouth daily as needed (acid reflux).     oxybutynin (DITROPAN-XL) 5 MG 24 hr tablet TAKE ONE TABLET BY MOUTH DAILY FOR OVERACTIVE BLADDER     pravastatin (PRAVACHOL) 80 MG tablet Take 80 mg by mouth daily.     sertraline (ZOLOFT) 100 MG tablet Take 100 mg by mouth 2 (two) times daily.     tamsulosin (FLOMAX) 0.4 MG CAPS capsule Take 0.4 mg by mouth at bedtime.     terbinafine (LAMISIL) 1 % cream Apply 1 application  topically 2 (two) times  daily as needed (itching).     ticagrelor (BRILINTA) 90 MG TABS tablet Take 90 mg by mouth 2 (two) times daily.     traMADol (ULTRAM) 50 MG tablet Take 1 tablet (50 mg total) by mouth every 6 (six) hours as needed. 30 tablet 0   No current facility-administered medications for this visit.    Review of Systems  Constitutional:  Positive for fatigue. Negative for appetite change, chills, fever and unexpected weight change.  HENT:   Negative for hearing loss and voice change.   Eyes:  Negative for eye problems and icterus.  Respiratory:  Negative for chest tightness, cough and shortness of breath.   Cardiovascular:  Negative for chest pain and leg swelling.  Gastrointestinal:  Negative for abdominal distention and abdominal pain.  Endocrine: Negative for hot flashes.  Genitourinary:  Negative for difficulty urinating and dysuria.   Musculoskeletal:  Negative for arthralgias.       Pain in the pubic area  Skin:  Negative for itching and rash.  Neurological:  Negative for light-headedness and numbness.  Hematological:  Negative for adenopathy. Does not bruise/bleed easily.  Psychiatric/Behavioral:  Negative for confusion.      PHYSICAL EXAMINATION: ECOG PERFORMANCE STATUS: 1 - Symptomatic but completely ambulatory  Vitals:   06/10/22 0924  BP: 129/73  Pulse: 62  Resp: 18  Temp: (!) 97.3 F (36.3 C)   Filed Weights   06/10/22 0924  Weight: 168 lb (76.2 kg)    Physical Exam Constitutional:      General: He is not in acute distress.    Appearance: He is not diaphoretic.     Comments: He walks with a cane   HENT:     Head: Normocephalic and atraumatic.  Eyes:     General: No scleral icterus. Cardiovascular:     Rate and Rhythm: Normal rate and regular rhythm.     Heart sounds: No murmur heard. Pulmonary:     Effort: Pulmonary effort is normal. No respiratory distress.  Abdominal:     General: Bowel sounds are normal. There is no distension.  Musculoskeletal:         General: Normal range of motion.     Cervical back: Normal range of motion  and neck supple.  Skin:    General: Skin is warm and dry.     Findings: No erythema.  Neurological:     Mental Status: He is alert and oriented to person, place, and time. Mental status is at baseline.     Cranial Nerves: No cranial nerve deficit.     Motor: No abnormal muscle tone.  Psychiatric:        Mood and Affect: Mood and affect normal.      LABORATORY DATA:  I have reviewed the data as listed    Latest Ref Rng & Units 05/23/2022    3:27 AM 05/23/2022    3:23 AM 05/23/2022    3:09 AM  CBC  WBC 4.0 - 10.5 K/uL   6.0   Hemoglobin 13.0 - 17.0 g/dL 16.1  09.6  04.5   Hematocrit 39.0 - 52.0 % 34.0  33.0  34.5   Platelets 150 - 400 K/uL   114       Latest Ref Rng & Units 05/23/2022    3:27 AM 05/23/2022    3:23 AM 05/23/2022    3:09 AM  CMP  Glucose 70 - 99 mg/dL 409  811  914   BUN 8 - 23 mg/dL 14  12  11    Creatinine 0.61 - 1.24 mg/dL 7.82  9.56  2.13   Sodium 135 - 145 mmol/L 137  137  135   Potassium 3.5 - 5.1 mmol/L 4.6  4.4  4.1   Chloride 98 - 111 mmol/L 103  103  103   CO2 22 - 32 mmol/L   24   Calcium 8.9 - 10.3 mg/dL   8.5   Total Protein 6.5 - 8.1 g/dL   6.3   Total Bilirubin 0.3 - 1.2 mg/dL   1.3   Alkaline Phos 38 - 126 U/L   601   AST 15 - 41 U/L   36   ALT 0 - 44 U/L   27      RADIOGRAPHIC STUDIES: I have personally reviewed the radiological images as listed and agreed with the findings in the report. DG Chest Port 1 View  Result Date: 05/23/2022 CLINICAL DATA:  History of osseous metastatic disease presenting with stroke-like symptoms. EXAM: PORTABLE CHEST 1 VIEW COMPARISON:  Jun 26, 2013 FINDINGS: The heart size and mediastinal contours are within normal limits. There is mild, stable elevation of the right hemidiaphragm. Both lungs are clear. No acute osseous abnormalities are identified. IMPRESSION: No active cardiopulmonary disease. Electronically Signed   By: Aram Candela M.D.   On: 05/23/2022 04:24   MR BRAIN WO CONTRAST  Result Date: 05/23/2022 CLINICAL DATA:  82 year old male code stroke presentation 0316 hours today. Reportedly left side deficits. EXAM: MRI HEAD WITHOUT CONTRAST TECHNIQUE: Multiplanar, multiecho pulse sequences of the brain and surrounding structures were obtained without intravenous contrast. COMPARISON:  Plain head CT 0316 hours today. CTA head and neck this morning. Previous brain MRI 12/31/2020 FINDINGS: Brain: No convincing restricted diffusion to suggest acute infarction. Stable cerebral volume since 2022. No midline shift, mass effect, evidence of mass lesion, ventriculomegaly, extra-axial collection or acute intracranial hemorrhage. Cervicomedullary junction and pituitary are within normal limits. Expected evolution the left pontine lacunar infarct since 2022 (series 5, image 11). Elsewhere largely normal for age gray and white matter signal throughout the brain. Chronic microhemorrhage in both the right lentiform (series 6, image 15, stable) And right cerebellum (image 9, might be new). But no cortical encephalomalacia or other  chronic blood products. Vascular: Major intracranial vascular flow voids Are stable compared to 2022. Generalized intracranial large vessel tortuosity, ectasia. Skull and upper cervical spine: Chronic posterior cervical spine fusion hardware artifact. Visible bone marrow signal remains within normal limits. Sinuses/Orbits: Stable.  Chronic left sphenoid sinus disease. Other: Mastoids remain clear. Visible internal auditory structures appear normal. Negative visible scalp and face. IMPRESSION: 1. No acute intracranial abnormality. 2. Chronic left pontine lacunar infarct. Minimal for age other chronic small vessel disease signal changes in the brain. Chronic intracranial artery ectasia. Electronically Signed   By: Odessa Fleming M.D.   On: 05/23/2022 04:11   CT ANGIO HEAD NECK W WO CM (CODE STROKE)  Result Date:  05/23/2022 CLINICAL DATA:  Acute neurologic deficit EXAM: CT ANGIOGRAPHY HEAD AND NECK WITH AND WITHOUT CONTRAST TECHNIQUE: Multidetector CT imaging of the head and neck was performed using the standard protocol during bolus administration of intravenous contrast. Multiplanar CT image reconstructions and MIPs were obtained to evaluate the vascular anatomy. Carotid stenosis measurements (when applicable) are obtained utilizing NASCET criteria, using the distal internal carotid diameter as the denominator. RADIATION DOSE REDUCTION: This exam was performed according to the departmental dose-optimization program which includes automated exposure control, adjustment of the mA and/or kV according to patient size and/or use of iterative reconstruction technique. CONTRAST:  75mL OMNIPAQUE IOHEXOL 350 MG/ML SOLN COMPARISON:  CTA head neck 12/31/2020 FINDINGS: CTA NECK FINDINGS SKELETON: There is no bony spinal canal stenosis. No lytic or blastic lesion. OTHER NECK: Normal pharynx, larynx and major salivary glands. No cervical lymphadenopathy. Unremarkable thyroid gland. UPPER CHEST: No pneumothorax or pleural effusion. No nodules or masses. AORTIC ARCH: There is calcific atherosclerosis of the aortic arch. There is no aneurysm, dissection or hemodynamically significant stenosis of the visualized portion of the aorta. Conventional 3 vessel aortic branching pattern. The visualized proximal subclavian arteries are widely patent. RIGHT CAROTID SYSTEM: No dissection, occlusion or aneurysm. Mild atherosclerotic calcification at the carotid bifurcation without hemodynamically significant stenosis. LEFT CAROTID SYSTEM: Normal without aneurysm, dissection or stenosis. VERTEBRAL ARTERIES: Right dominant configuration. Right vertebral artery is normal to the skull base. There is unchanged diminutive appearance of the left vertebral artery with multifocal narrowing. CTA HEAD FINDINGS POSTERIOR CIRCULATION: --Vertebral arteries: Patent  stent in the right V4 segment. Narrowed left V4 segment, unchanged. --Inferior cerebellar arteries: Normal. --Basilar artery: Basilar artery stent. --Superior cerebellar arteries: Normal. --Posterior cerebral arteries (PCA): Normal. ANTERIOR CIRCULATION: --Intracranial internal carotid arteries: Normal. --Anterior cerebral arteries (ACA): Normal. Both A1 segments are present. Patent anterior communicating artery (a-comm). --Middle cerebral arteries (MCA): Normal. VENOUS SINUSES: As permitted by contrast timing, patent. ANATOMIC VARIANTS: Fetal origin of the right posterior cerebral artery. Review of the MIP images confirms the above findings. IMPRESSION: 1. No emergent large vessel occlusion. 2. Unchanged diminutive appearance of the left vertebral artery with multifocal narrowing. 3. Patent stent in the right V4 segment vertebral artery and basilar artery. Electronically Signed   By: Deatra Robinson M.D.   On: 05/23/2022 03:41   CT HEAD CODE STROKE WO CONTRAST  Result Date: 05/23/2022 CLINICAL DATA:  Code stroke.  Dizziness and left-sided weakness EXAM: CT HEAD WITHOUT CONTRAST TECHNIQUE: Contiguous axial images were obtained from the base of the skull through the vertex without intravenous contrast. RADIATION DOSE REDUCTION: This exam was performed according to the departmental dose-optimization program which includes automated exposure control, adjustment of the mA and/or kV according to patient size and/or use of iterative reconstruction technique. COMPARISON:  None Available. FINDINGS: Brain:  There is no mass, hemorrhage or extra-axial collection. The size and configuration of the ventricles and extra-axial CSF spaces are normal. There is hypoattenuation of the periventricular white matter, most commonly indicating chronic ischemic microangiopathy. Vascular: Stent within the right vertebral and basilar arteries. Calcific atherosclerosis of the internal carotid arteries at the skull base. Skull: The visualized  skull base, calvarium and extracranial soft tissues are normal. Sinuses/Orbits: Left sphenoid sinus mucosal thickening. The orbits are normal. ASPECTS [facility] Stroke Program Early CT Score) - Ganglionic level infarction (caudate, lentiform nuclei, internal capsule, insula, M1-M3 cortex): 7 - Supraganglionic infarction (M4-M6 cortex): 3 Total score (0-10 with 10 being normal): 10 IMPRESSION: 1. No acute intracranial abnormality. 2. ASPECTS is 10. These results were communicated to Dr. Milon Dikes at 3:22 am on 05/23/2022 by text page via the [facility] messaging system. Electronically Signed   By: Deatra Robinson M.D.   On: 05/23/2022 03:25

## 2022-06-10 NOTE — Assessment & Plan Note (Signed)
Imaging results and biopsy results were reviewed and discussed with patient. 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 I recommended anticoagulation therapy, which he has already started at the [place]. Systemic options discussed with patient.  Docetaxel q. 3 weeks x 6 +/-androgen receptor pathway inhibitors.  Rationale and potential side effects were reviewed with patient.  Patient prefers androgen receptor pathway inhibitors.  Per patient, his [place] oncologist has mentioned this option to him.  Patient plans to continue his [facility].  [facility] for [facility] ARPI therapy. Patient has appointment with radiation oncology today.

## 2022-06-11 ENCOUNTER — Other Ambulatory Visit: Payer: Self-pay | Admitting: *Deleted

## 2022-06-11 DIAGNOSIS — C7951 Secondary malignant neoplasm of bone: Secondary | ICD-10-CM

## 2022-06-15 ENCOUNTER — Ambulatory Visit: Payer: No Typology Code available for payment source | Admitting: Radiation Oncology

## 2022-06-18 ENCOUNTER — Encounter
Admission: RE | Admit: 2022-06-18 | Discharge: 2022-06-18 | Disposition: A | Discharge status: Home / Self Care | Payer: No Typology Code available for payment source | Source: Ambulatory Visit | Attending: Radiation Oncology | Admitting: Radiation Oncology

## 2022-06-18 ENCOUNTER — Other Ambulatory Visit: Payer: Self-pay | Admitting: *Deleted

## 2022-06-18 DIAGNOSIS — C7951 Secondary malignant neoplasm of bone: Secondary | ICD-10-CM | POA: Insufficient documentation

## 2022-06-18 MED ORDER — TECHNETIUM TC 99M MEDRONATE IV KIT
20.0000 | PACK | Freq: Once | INTRAVENOUS | Status: AC | PRN
  Administered 2022-06-18: 23.43 via INTRAVENOUS

## 2022-06-21 ENCOUNTER — Ambulatory Visit
Admission: RE | Admit: 2022-06-21 | Discharge: 2022-06-21 | Disposition: A | Discharge status: Home / Self Care | Payer: No Typology Code available for payment source | Source: Ambulatory Visit | Attending: Radiation Oncology | Admitting: Radiation Oncology

## 2022-06-21 DIAGNOSIS — C7951 Secondary malignant neoplasm of bone: Secondary | ICD-10-CM | POA: Diagnosis present

## 2022-06-21 DIAGNOSIS — C801 Malignant (primary) neoplasm, unspecified: Secondary | ICD-10-CM | POA: Diagnosis not present

## 2022-06-23 DIAGNOSIS — C7951 Secondary malignant neoplasm of bone: Secondary | ICD-10-CM | POA: Diagnosis not present

## 2022-06-24 ENCOUNTER — Ambulatory Visit: Payer: No Typology Code available for payment source

## 2022-06-25 ENCOUNTER — Ambulatory Visit: Admission: RE | Admit: 2022-06-25 | Payer: No Typology Code available for payment source | Source: Ambulatory Visit

## 2022-06-25 DIAGNOSIS — C7951 Secondary malignant neoplasm of bone: Secondary | ICD-10-CM | POA: Diagnosis not present

## 2022-06-29 ENCOUNTER — Other Ambulatory Visit: Payer: Self-pay

## 2022-06-29 ENCOUNTER — Ambulatory Visit
Admission: RE | Admit: 2022-06-29 | Discharge: 2022-06-29 | Disposition: A | Discharge status: Home / Self Care | Payer: No Typology Code available for payment source | Source: Ambulatory Visit | Attending: Radiation Oncology | Admitting: Radiation Oncology

## 2022-06-29 DIAGNOSIS — C7951 Secondary malignant neoplasm of bone: Secondary | ICD-10-CM | POA: Diagnosis not present

## 2022-06-29 LAB — RAD ONC ARIA SESSION SUMMARY
Course Elapsed Days: 0
Plan Fractions Treated to Date: 1
Plan Prescribed Dose Per Fraction: 3 Gy
Plan Total Fractions Prescribed: 10
Plan Total Prescribed Dose: 30 Gy
Reference Point Dosage Given to Date: 3 Gy
Reference Point Session Dosage Given: 3 Gy
Session Number: 1

## 2022-06-30 ENCOUNTER — Other Ambulatory Visit: Payer: No Typology Code available for payment source

## 2022-06-30 ENCOUNTER — Ambulatory Visit: Payer: No Typology Code available for payment source

## 2022-06-30 ENCOUNTER — Inpatient Hospital Stay: Payer: Medicare PPO

## 2022-07-01 ENCOUNTER — Ambulatory Visit: Payer: No Typology Code available for payment source

## 2022-07-01 ENCOUNTER — Other Ambulatory Visit (HOSPITAL_COMMUNITY): Payer: Self-pay | Admitting: Neuroradiology

## 2022-07-01 DIAGNOSIS — I639 Cerebral infarction, unspecified: Secondary | ICD-10-CM

## 2022-07-02 ENCOUNTER — Ambulatory Visit: Payer: No Typology Code available for payment source

## 2022-07-05 ENCOUNTER — Ambulatory Visit: Payer: No Typology Code available for payment source

## 2022-07-06 ENCOUNTER — Ambulatory Visit: Payer: No Typology Code available for payment source

## 2022-07-07 ENCOUNTER — Inpatient Hospital Stay: Payer: Medicare PPO | Attending: Oncology

## 2022-07-07 ENCOUNTER — Ambulatory Visit
Admission: RE | Admit: 2022-07-07 | Discharge: 2022-07-07 | Disposition: A | Discharge status: Home / Self Care | Payer: No Typology Code available for payment source | Source: Ambulatory Visit | Attending: Radiation Oncology | Admitting: Radiation Oncology

## 2022-07-07 ENCOUNTER — Other Ambulatory Visit: Payer: Self-pay

## 2022-07-07 DIAGNOSIS — C7951 Secondary malignant neoplasm of bone: Secondary | ICD-10-CM | POA: Diagnosis present

## 2022-07-07 DIAGNOSIS — C801 Malignant (primary) neoplasm, unspecified: Secondary | ICD-10-CM | POA: Insufficient documentation

## 2022-07-07 DIAGNOSIS — C61 Malignant neoplasm of prostate: Secondary | ICD-10-CM | POA: Diagnosis present

## 2022-07-07 LAB — RAD ONC ARIA SESSION SUMMARY
Course Elapsed Days: 8
Plan Fractions Treated to Date: 2
Plan Prescribed Dose Per Fraction: 3 Gy
Plan Total Fractions Prescribed: 10
Plan Total Prescribed Dose: 30 Gy
Reference Point Dosage Given to Date: 6 Gy
Reference Point Session Dosage Given: 3 Gy
Session Number: 2

## 2022-07-07 LAB — CBC (CANCER CENTER ONLY)
HCT: 33.8 % — ABNORMAL LOW (ref 39.0–52.0)
Hemoglobin: 11.2 g/dL — ABNORMAL LOW (ref 13.0–17.0)
MCH: 25.8 pg — ABNORMAL LOW (ref 26.0–34.0)
MCHC: 33.1 g/dL (ref 30.0–36.0)
MCV: 77.9 fL — ABNORMAL LOW (ref 80.0–100.0)
Platelet Count: 153 10*3/uL (ref 150–400)
RBC: 4.34 MIL/uL (ref 4.22–5.81)
RDW: 16.8 % — ABNORMAL HIGH (ref 11.5–15.5)
WBC Count: 6.1 10*3/uL (ref 4.0–10.5)
nRBC: 0 % (ref 0.0–0.2)

## 2022-07-08 ENCOUNTER — Ambulatory Visit
Admission: RE | Admit: 2022-07-08 | Discharge: 2022-07-08 | Disposition: A | Discharge status: Home / Self Care | Payer: No Typology Code available for payment source | Source: Ambulatory Visit | Attending: Radiation Oncology | Admitting: Radiation Oncology

## 2022-07-08 ENCOUNTER — Other Ambulatory Visit: Payer: Self-pay

## 2022-07-08 DIAGNOSIS — C7951 Secondary malignant neoplasm of bone: Secondary | ICD-10-CM | POA: Diagnosis not present

## 2022-07-08 LAB — RAD ONC ARIA SESSION SUMMARY
Course Elapsed Days: 9
Plan Fractions Treated to Date: 3
Plan Prescribed Dose Per Fraction: 3 Gy
Plan Total Fractions Prescribed: 10
Plan Total Prescribed Dose: 30 Gy
Reference Point Dosage Given to Date: 9 Gy
Reference Point Session Dosage Given: 3 Gy
Session Number: 3

## 2022-07-09 ENCOUNTER — Other Ambulatory Visit: Payer: Self-pay

## 2022-07-09 ENCOUNTER — Ambulatory Visit
Admission: RE | Admit: 2022-07-09 | Discharge: 2022-07-09 | Disposition: A | Discharge status: Home / Self Care | Payer: No Typology Code available for payment source | Source: Ambulatory Visit | Attending: Radiation Oncology | Admitting: Radiation Oncology

## 2022-07-09 DIAGNOSIS — C7951 Secondary malignant neoplasm of bone: Secondary | ICD-10-CM | POA: Diagnosis not present

## 2022-07-09 LAB — RAD ONC ARIA SESSION SUMMARY
Course Elapsed Days: 10
Plan Fractions Treated to Date: 4
Plan Prescribed Dose Per Fraction: 3 Gy
Plan Total Fractions Prescribed: 10
Plan Total Prescribed Dose: 30 Gy
Reference Point Dosage Given to Date: 12 Gy
Reference Point Session Dosage Given: 3 Gy
Session Number: 4

## 2022-07-12 ENCOUNTER — Ambulatory Visit
Admission: RE | Admit: 2022-07-12 | Discharge: 2022-07-12 | Disposition: A | Discharge status: Home / Self Care | Payer: No Typology Code available for payment source | Source: Ambulatory Visit | Attending: Radiation Oncology | Admitting: Radiation Oncology

## 2022-07-12 ENCOUNTER — Other Ambulatory Visit: Payer: Self-pay

## 2022-07-12 DIAGNOSIS — C7951 Secondary malignant neoplasm of bone: Secondary | ICD-10-CM | POA: Diagnosis not present

## 2022-07-12 LAB — RAD ONC ARIA SESSION SUMMARY
Course Elapsed Days: 13
Plan Fractions Treated to Date: 5
Plan Prescribed Dose Per Fraction: 3 Gy
Plan Total Fractions Prescribed: 10
Plan Total Prescribed Dose: 30 Gy
Reference Point Dosage Given to Date: 15 Gy
Reference Point Session Dosage Given: 3 Gy
Session Number: 5

## 2022-07-13 ENCOUNTER — Other Ambulatory Visit: Payer: Self-pay

## 2022-07-13 ENCOUNTER — Ambulatory Visit
Admission: RE | Admit: 2022-07-13 | Discharge: 2022-07-13 | Disposition: A | Discharge status: Home / Self Care | Payer: No Typology Code available for payment source | Source: Ambulatory Visit | Attending: Radiation Oncology | Admitting: Radiation Oncology

## 2022-07-13 DIAGNOSIS — C7951 Secondary malignant neoplasm of bone: Secondary | ICD-10-CM | POA: Diagnosis not present

## 2022-07-13 LAB — RAD ONC ARIA SESSION SUMMARY
Course Elapsed Days: 14
Plan Fractions Treated to Date: 6
Plan Prescribed Dose Per Fraction: 3 Gy
Plan Total Fractions Prescribed: 10
Plan Total Prescribed Dose: 30 Gy
Reference Point Dosage Given to Date: 18 Gy
Reference Point Session Dosage Given: 3 Gy
Session Number: 6

## 2022-07-14 ENCOUNTER — Other Ambulatory Visit: Payer: Self-pay

## 2022-07-14 ENCOUNTER — Ambulatory Visit
Admission: RE | Admit: 2022-07-14 | Discharge: 2022-07-14 | Disposition: A | Discharge status: Home / Self Care | Payer: No Typology Code available for payment source | Source: Ambulatory Visit | Attending: Radiation Oncology | Admitting: Radiation Oncology

## 2022-07-14 DIAGNOSIS — C7951 Secondary malignant neoplasm of bone: Secondary | ICD-10-CM | POA: Diagnosis not present

## 2022-07-14 LAB — RAD ONC ARIA SESSION SUMMARY
Course Elapsed Days: 15
Plan Fractions Treated to Date: 7
Plan Prescribed Dose Per Fraction: 3 Gy
Plan Total Fractions Prescribed: 10
Plan Total Prescribed Dose: 30 Gy
Reference Point Dosage Given to Date: 21 Gy
Reference Point Session Dosage Given: 3 Gy
Session Number: 7

## 2022-07-15 ENCOUNTER — Other Ambulatory Visit: Payer: Self-pay

## 2022-07-15 ENCOUNTER — Ambulatory Visit
Admission: RE | Admit: 2022-07-15 | Discharge: 2022-07-15 | Disposition: A | Discharge status: Home / Self Care | Payer: No Typology Code available for payment source | Source: Ambulatory Visit | Attending: Radiation Oncology | Admitting: Radiation Oncology

## 2022-07-15 DIAGNOSIS — C7951 Secondary malignant neoplasm of bone: Secondary | ICD-10-CM | POA: Diagnosis not present

## 2022-07-15 LAB — RAD ONC ARIA SESSION SUMMARY
Course Elapsed Days: 16
Plan Fractions Treated to Date: 8
Plan Prescribed Dose Per Fraction: 3 Gy
Plan Total Fractions Prescribed: 10
Plan Total Prescribed Dose: 30 Gy
Reference Point Dosage Given to Date: 24 Gy
Reference Point Session Dosage Given: 3 Gy
Session Number: 8

## 2022-07-16 ENCOUNTER — Other Ambulatory Visit: Payer: Self-pay

## 2022-07-16 ENCOUNTER — Ambulatory Visit
Admission: RE | Admit: 2022-07-16 | Discharge: 2022-07-16 | Disposition: A | Discharge status: Home / Self Care | Payer: No Typology Code available for payment source | Source: Ambulatory Visit | Attending: Radiation Oncology | Admitting: Radiation Oncology

## 2022-07-16 DIAGNOSIS — C7951 Secondary malignant neoplasm of bone: Secondary | ICD-10-CM | POA: Diagnosis not present

## 2022-07-16 LAB — RAD ONC ARIA SESSION SUMMARY
Course Elapsed Days: 17
Plan Fractions Treated to Date: 9
Plan Prescribed Dose Per Fraction: 3 Gy
Plan Total Fractions Prescribed: 10
Plan Total Prescribed Dose: 30 Gy
Reference Point Dosage Given to Date: 27 Gy
Reference Point Session Dosage Given: 3 Gy
Session Number: 9

## 2022-07-19 ENCOUNTER — Other Ambulatory Visit: Payer: Self-pay

## 2022-07-19 ENCOUNTER — Ambulatory Visit
Admission: RE | Admit: 2022-07-19 | Discharge: 2022-07-19 | Disposition: A | Discharge status: Home / Self Care | Payer: No Typology Code available for payment source | Source: Ambulatory Visit | Attending: Radiation Oncology | Admitting: Radiation Oncology

## 2022-07-19 DIAGNOSIS — C7951 Secondary malignant neoplasm of bone: Secondary | ICD-10-CM | POA: Diagnosis not present

## 2022-07-19 LAB — RAD ONC ARIA SESSION SUMMARY
Course Elapsed Days: 20
Plan Fractions Treated to Date: 10
Plan Prescribed Dose Per Fraction: 3 Gy
Plan Total Fractions Prescribed: 10
Plan Total Prescribed Dose: 30 Gy
Reference Point Dosage Given to Date: 30 Gy
Reference Point Session Dosage Given: 3 Gy
Session Number: 10

## 2022-08-02 ENCOUNTER — Ambulatory Visit
Admission: RE | Admit: 2022-08-02 | Discharge: 2022-08-02 | Disposition: A | Discharge status: Home / Self Care | Payer: No Typology Code available for payment source | Source: Ambulatory Visit | Attending: Radiation Oncology | Admitting: Radiation Oncology

## 2022-08-02 ENCOUNTER — Encounter: Payer: Self-pay | Admitting: Radiation Oncology

## 2022-08-02 VITALS — BP 122/69 | HR 73 | Temp 96.6°F | Resp 19 | Wt 175.0 lb

## 2022-08-02 DIAGNOSIS — C61 Malignant neoplasm of prostate: Secondary | ICD-10-CM | POA: Insufficient documentation

## 2022-08-02 NOTE — Progress Notes (Signed)
Radiation Oncology Follow up Note  Name: Kevin Santiago   Date:   08/02/2022 MRN:  [id number] DOB: 1940/11/02    This 82 y.o. male presents to the clinic today for 1 month follow-up status post palliative radiation therapy to his left hemipelvis for metastatic stage IV prostate cancer.  REFERRING PROVIDER: Carmina Miller, MD  HPI: Patient is a 82 year old male with widespread bone metastasis from stage IV prostate cancer seen today in routine follow-up he still states he is having some pain mostly in the posterior thigh region.  He is having no significant exacerbation of pain in his back or other areas of his pelvis..  COMPLICATIONS OF TREATMENT: none  FOLLOW UP COMPLIANCE: keeps appointments   PHYSICAL EXAM:  BP 122/69   Pulse 73   Temp (!) 96.6 F (35.9 C)   Resp 19   Wt 175 lb (79.4 kg)   SpO2 98%   BMI 25.11 kg/m  Range of motion of his lower extremities does not elicit pain.  Motor and sensory levels are equal symmetric in lower extremities.  Well-developed well-nourished patient in NAD. HEENT reveals PERLA, EOMI, discs not visualized.  Oral cavity is clear. No oral mucosal lesions are identified. Neck is clear without evidence of cervical or supraclavicular adenopathy. Lungs are clear to A&P. Cardiac examination is essentially unremarkable with regular rate and rhythm without murmur rub or thrill. Abdomen is benign with no organomegaly or masses noted. Motor sensory and DTR levels are equal and symmetric in the upper and lower extremities. Cranial nerves II through XII are grossly intact. Proprioception is intact. No peripheral adenopathy or edema is identified. No motor or sensory levels are noted. Crude visual fields are within normal range.  RADIOLOGY RESULTS: Bone scan has been reviewed  PLAN: This patient is under good pain control with no areas of exacerbation of his pain at this time.  I like to defer any further treatment down the road 3 to 4 months and reevaluate him at  that time.  He has been referred back to the [place] I believe for evaluation of that.  He continues close follow-up care with medical oncology.  Will reevaluate him in 3 to 4 months.  Patient knows to call sooner with any concerns.  I would like to take this opportunity to thank you for allowing me to participate in the care of your patient.Carmina Miller, MD

## 2022-08-18 ENCOUNTER — Ambulatory Visit: Payer: No Typology Code available for payment source | Admitting: Radiation Oncology

## 2022-12-06 ENCOUNTER — Ambulatory Visit
Admission: RE | Admit: 2022-12-06 | Discharge: 2022-12-06 | Disposition: A | Discharge status: Home / Self Care | Payer: Medicare PPO | Source: Ambulatory Visit | Attending: Radiation Oncology | Admitting: Radiation Oncology

## 2022-12-06 ENCOUNTER — Encounter: Payer: Self-pay | Admitting: Radiation Oncology

## 2022-12-06 VITALS — BP 144/85 | HR 58 | Temp 97.4°F | Resp 16 | Wt 174.0 lb

## 2022-12-06 DIAGNOSIS — C61 Malignant neoplasm of prostate: Secondary | ICD-10-CM

## 2022-12-06 DIAGNOSIS — M546 Pain in thoracic spine: Secondary | ICD-10-CM | POA: Insufficient documentation

## 2022-12-06 DIAGNOSIS — M545 Low back pain, unspecified: Secondary | ICD-10-CM | POA: Diagnosis not present

## 2022-12-06 DIAGNOSIS — R351 Nocturia: Secondary | ICD-10-CM | POA: Insufficient documentation

## 2022-12-06 DIAGNOSIS — C7951 Secondary malignant neoplasm of bone: Secondary | ICD-10-CM

## 2022-12-06 DIAGNOSIS — M79606 Pain in leg, unspecified: Secondary | ICD-10-CM | POA: Diagnosis not present

## 2022-12-06 NOTE — Progress Notes (Signed)
Radiation Oncology Follow up Note  Name: Kevin Santiago   Date:   12/06/2022 MRN:  [id number] DOB: 16-Jun-1940    This 82 y.o. male presents to the clinic today for 33-month follow-up status post palliative radiation therapy to his left hemipelvis for stage IV adenocarcinoma the prostate.  REFERRING PROVIDER: No ref. provider found  HPI: The patient, a 82 year old with stage four metastatic prostate cancer, presents for routine follow-up five months after completing palliative radiation therapy to the left hemipelvis. He reports persistent pain in the upper back, across the midline, and occasional 'popping' sensation in the lower back when exiting a car. He also reports difficulty crossing his legs due to pain. Despite these symptoms, the patient's prostate-specific antigen (PSA) levels have decreased to 1.5, indicating a positive response to the ongoing treatment for prostate cancer at the [facility].  However, he has run out of this medication. He also reports occasional rib pain, which has significantly improved..  COMPLICATIONS OF TREATMENT: none  FOLLOW UP COMPLIANCE: keeps appointments   PHYSICAL EXAM:  BP (!) 144/85 Comment: Patient seeing PCP 11/6  Pulse (!) 58   Temp (!) 97.4 F (36.3 C) (Tympanic)   Resp 16   Wt 174 lb (78.9 kg)   BMI 24.97 kg/m  Range of motion of his lower extremities does not elicit pain motor and sensory levels are equal and symmetric in the lower extremities.  Deep palpation of the spine does not elicit pain.  Well-developed well-nourished patient in NAD. HEENT reveals PERLA, EOMI, discs not visualized.  Oral cavity is clear. No oral mucosal lesions are identified. Neck is clear without evidence of cervical or supraclavicular adenopathy. Lungs are clear to A&P. Cardiac examination is essentially unremarkable with regular rate and rhythm without murmur rub or thrill. Abdomen is benign with no organomegaly or masses noted. Motor sensory and DTR levels are  equal and symmetric in the upper and lower extremities. Cranial nerves II through XII are grossly intact. Proprioception is intact. No peripheral adenopathy or edema is identified. No motor or sensory levels are noted. Crude visual fields are within normal range.  RADIOLOGY RESULTS: Bone scan reviewed compatible with above-stated findings showing widespread metastatic disease.  PLAN: Metastatic Prostate Cancer Patient is 5 months post palliative radiation therapy to left hemipelvis. Reports upper back pain and difficulty crossing legs due to pain. No significant rib pain. PSA is well controlled at 1.5. -Plan to order a new bone scan in approximately 5 months. -Schedule follow-up appointment a week after the bone scan.  Nocturia Patient reports frequent nocturnal urination. Currently out of Flomax. -Advise patient to refill Flomax prescription at the [place].  Respiratory Patient reports difficulty with expectoration. -Recommend over-the-counter Mucinex, one pill daily, to help liquefy secretions.    Carmina Miller, MD
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
## 2023-02-22 ENCOUNTER — Telehealth (HOSPITAL_COMMUNITY): Payer: Self-pay

## 2023-02-22 NOTE — Telephone Encounter (Signed)
Returned pt's call, no answer, left vm. AB  

## 2023-02-25 ENCOUNTER — Ambulatory Visit (HOSPITAL_COMMUNITY): Payer: No Typology Code available for payment source

## 2023-02-28 ENCOUNTER — Other Ambulatory Visit (HOSPITAL_COMMUNITY): Payer: No Typology Code available for payment source

## 2023-05-02 ENCOUNTER — Ambulatory Visit: Payer: No Typology Code available for payment source

## 2023-05-05 ENCOUNTER — Other Ambulatory Visit

## 2023-05-05 ENCOUNTER — Telehealth: Payer: Self-pay | Admitting: *Deleted

## 2023-05-05 NOTE — Telephone Encounter (Signed)
 Kevin Santiago asked to call her back to talk to you and her number is [phone]. She wanted to have a cal today and if not can you let her know when he will be free to talk to her

## 2023-05-09 ENCOUNTER — Ambulatory Visit: Payer: No Typology Code available for payment source | Admitting: Radiation Oncology

## 2023-05-16 ENCOUNTER — Other Ambulatory Visit: Payer: Self-pay | Admitting: *Deleted

## 2023-05-16 DIAGNOSIS — C7951 Secondary malignant neoplasm of bone: Secondary | ICD-10-CM

## 2023-05-17 ENCOUNTER — Other Ambulatory Visit

## 2023-05-19 ENCOUNTER — Other Ambulatory Visit

## 2023-05-24 ENCOUNTER — Ambulatory Visit
Admission: RE | Admit: 2023-05-24 | Discharge: 2023-05-24 | Disposition: A | Discharge status: Home / Self Care | Source: Ambulatory Visit | Attending: Radiation Oncology | Admitting: Radiation Oncology

## 2023-05-24 DIAGNOSIS — C61 Malignant neoplasm of prostate: Secondary | ICD-10-CM | POA: Insufficient documentation

## 2023-05-24 DIAGNOSIS — C7951 Secondary malignant neoplasm of bone: Secondary | ICD-10-CM | POA: Diagnosis not present

## 2023-05-24 DIAGNOSIS — R9721 Rising PSA following treatment for malignant neoplasm of prostate: Secondary | ICD-10-CM | POA: Diagnosis not present

## 2023-05-24 MED ORDER — FLOTUFOLASTAT F 18 GALLIUM 296-5846 MBQ/ML IV SOLN
8.0000 | Freq: Once | INTRAVENOUS | Status: AC
  Administered 2023-05-24: 8.34 via INTRAVENOUS
  Filled 2023-05-24: qty 8

## 2023-06-13 ENCOUNTER — Ambulatory Visit: Admitting: Radiation Oncology

## 2023-06-20 ENCOUNTER — Ambulatory Visit: Admission: RE | Admit: 2023-06-20 | Source: Ambulatory Visit | Admitting: Radiation Oncology

## 2023-06-20 ENCOUNTER — Telehealth: Payer: Self-pay | Admitting: Radiation Oncology

## 2023-06-20 NOTE — Telephone Encounter (Signed)
 Kevin Santiago sent a message through our answering service to cancel his appt. on 06/20/23 w/ Dr Jacalyn Martin. Dr Whitney Hams team was notified.

## 2023-06-29 ENCOUNTER — Encounter: Payer: Self-pay | Admitting: Radiation Oncology

## 2023-06-29 ENCOUNTER — Ambulatory Visit
Admission: RE | Admit: 2023-06-29 | Discharge: 2023-06-29 | Disposition: A | Discharge status: Home / Self Care | Source: Ambulatory Visit | Attending: Radiation Oncology | Admitting: Radiation Oncology

## 2023-06-29 VITALS — BP 137/78 | HR 62 | Temp 97.3°F | Resp 16 | Wt 177.4 lb

## 2023-06-29 DIAGNOSIS — C61 Malignant neoplasm of prostate: Secondary | ICD-10-CM | POA: Insufficient documentation

## 2023-06-29 DIAGNOSIS — C7951 Secondary malignant neoplasm of bone: Secondary | ICD-10-CM | POA: Insufficient documentation

## 2023-06-29 DIAGNOSIS — Z923 Personal history of irradiation: Secondary | ICD-10-CM | POA: Diagnosis not present

## 2023-06-29 NOTE — Progress Notes (Signed)
 Radiation Oncology Follow up Note  Name: Kevin Santiago   Date:   06/29/2023 MRN:  [id number] DOB: 1940/02/13    This 83 y.o. 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  REFERRING PROVIDER: [facility]  HPI: Patient is an 83 year old male well-known to our department having received palliative radiation therapy to his left hemipelvis for stage IV adenocarcinoma the prostate.  He is seen today in follow-up.  I had ordered a.  PSMA PET scan back in April which showed again multifocal skeletal metastasis with an aggressive lesion in the right ischium.  He did have avid radiotracer uptake in the lumbar and T12 region.  The ischial met metastatic lesion is adjacent to the acetabulum with cortical destruction of the medial margin.  Patient has been somewhat lost to follow-up.  Today he complains of again right inguinal pain as well as mid back pain.  He is ambulating fairly well although does have a slight limp.  He is having no focal neurologic deficits.  COMPLICATIONS OF TREATMENT: none  FOLLOW UP COMPLIANCE: keeps appointments   PHYSICAL EXAM:  BP 137/78   Pulse 62   Temp (!) 97.3 F (36.3 C)   Resp 16   Wt 177 lb 6.4 oz (80.5 kg)   BMI 25.45 kg/m  Range of motion of the lower extremities does elicit some pain.  Deep palpation of his mid back does elicit pain.  Motor sensory and Diette levels are equal symmetric in the upper and lower extremities.  Proprioception is intact.  Well-developed well-nourished patient in NAD. HEENT reveals PERLA, EOMI, discs not visualized.  Oral cavity is clear. No oral mucosal lesions are identified. Neck is clear without evidence of cervical or supraclavicular adenopathy. Lungs are clear to A&P. Cardiac examination is essentially unremarkable with  regular rate and rhythm without murmur rub or thrill. Abdomen is benign with no organomegaly or masses noted. Motor sensory and DTR levels are equal and symmetric in the upper and lower extremities. Cranial nerves II through XII are grossly intact. Proprioception is intact. No peripheral adenopathy or edema is identified. No motor or sensory levels are noted. Crude visual fields are within normal range.  RADIOLOGY RESULTS: PSMA PET scan reviewed compatible with above-stated findings  PLAN: At this time lets go ahead with SBRT to his right ischial tumor plan on delivering 30 Gray in 5 fractions.  Risks and benefits of treatment including low side effect profile possible skin reaction possible fatigue were reviewed with the patient.  I have set him up for simulation for that.  Once that is performed depending on patient's tolerance may go ahead with lower thoracic lumbar spine radiation therapy.  I have personally set up and ordered CT simulation for early next week.  Patient comprehends my recommendations well.  I will also be referring him to medical oncology for consideration of systemic treatment as well as ADT therapy.  I would like to take this opportunity to thank you for allowing me to participate in the care of your patient.Glenis Langdon, MD

## 2023-07-04 ENCOUNTER — Ambulatory Visit
Admission: RE | Admit: 2023-07-04 | Discharge: 2023-07-04 | Disposition: A | Discharge status: Home / Self Care | Source: Ambulatory Visit | Attending: Radiation Oncology | Admitting: Radiation Oncology

## 2023-07-04 DIAGNOSIS — Z923 Personal history of irradiation: Secondary | ICD-10-CM | POA: Insufficient documentation

## 2023-07-04 DIAGNOSIS — C7951 Secondary malignant neoplasm of bone: Secondary | ICD-10-CM | POA: Diagnosis present

## 2023-07-04 DIAGNOSIS — C61 Malignant neoplasm of prostate: Secondary | ICD-10-CM | POA: Diagnosis not present

## 2023-07-06 DIAGNOSIS — C7951 Secondary malignant neoplasm of bone: Secondary | ICD-10-CM | POA: Diagnosis not present

## 2023-07-14 ENCOUNTER — Other Ambulatory Visit: Payer: Self-pay

## 2023-07-14 ENCOUNTER — Ambulatory Visit
Admission: RE | Admit: 2023-07-14 | Discharge: 2023-07-14 | Disposition: A | Discharge status: Home / Self Care | Source: Ambulatory Visit | Attending: Radiation Oncology | Admitting: Radiation Oncology

## 2023-07-14 DIAGNOSIS — C7951 Secondary malignant neoplasm of bone: Secondary | ICD-10-CM | POA: Diagnosis not present

## 2023-07-14 LAB — RAD ONC ARIA SESSION SUMMARY
Course Elapsed Days: 0
Plan Fractions Treated to Date: 1
Plan Prescribed Dose Per Fraction: 6 Gy
Plan Total Fractions Prescribed: 5
Plan Total Prescribed Dose: 30 Gy
Reference Point Dosage Given to Date: 6 Gy
Reference Point Session Dosage Given: 6 Gy
Session Number: 1

## 2023-07-19 ENCOUNTER — Ambulatory Visit
Admission: RE | Admit: 2023-07-19 | Discharge: 2023-07-19 | Disposition: A | Discharge status: Home / Self Care | Source: Ambulatory Visit | Attending: Radiation Oncology | Admitting: Radiation Oncology

## 2023-07-19 ENCOUNTER — Other Ambulatory Visit: Payer: Self-pay

## 2023-07-19 DIAGNOSIS — C7951 Secondary malignant neoplasm of bone: Secondary | ICD-10-CM | POA: Diagnosis not present

## 2023-07-19 LAB — RAD ONC ARIA SESSION SUMMARY
Course Elapsed Days: 5
Plan Fractions Treated to Date: 2
Plan Prescribed Dose Per Fraction: 6 Gy
Plan Total Fractions Prescribed: 5
Plan Total Prescribed Dose: 30 Gy
Reference Point Dosage Given to Date: 12 Gy
Reference Point Session Dosage Given: 6 Gy
Session Number: 2

## 2023-07-21 ENCOUNTER — Ambulatory Visit
Admission: RE | Admit: 2023-07-21 | Discharge: 2023-07-21 | Disposition: A | Discharge status: Home / Self Care | Source: Ambulatory Visit | Attending: Radiation Oncology | Admitting: Radiation Oncology

## 2023-07-21 ENCOUNTER — Other Ambulatory Visit: Payer: Self-pay

## 2023-07-21 DIAGNOSIS — C7951 Secondary malignant neoplasm of bone: Secondary | ICD-10-CM | POA: Diagnosis not present

## 2023-07-21 LAB — RAD ONC ARIA SESSION SUMMARY
Course Elapsed Days: 7
Plan Fractions Treated to Date: 3
Plan Prescribed Dose Per Fraction: 6 Gy
Plan Total Fractions Prescribed: 5
Plan Total Prescribed Dose: 30 Gy
Reference Point Dosage Given to Date: 18 Gy
Reference Point Session Dosage Given: 6 Gy
Session Number: 3

## 2023-07-26 ENCOUNTER — Ambulatory Visit
Admission: RE | Admit: 2023-07-26 | Discharge: 2023-07-26 | Disposition: A | Discharge status: Home / Self Care | Source: Ambulatory Visit | Attending: Radiation Oncology | Admitting: Radiation Oncology

## 2023-07-26 ENCOUNTER — Other Ambulatory Visit: Payer: Self-pay

## 2023-07-26 DIAGNOSIS — C7951 Secondary malignant neoplasm of bone: Secondary | ICD-10-CM | POA: Diagnosis not present

## 2023-07-26 LAB — RAD ONC ARIA SESSION SUMMARY
Course Elapsed Days: 12
Plan Fractions Treated to Date: 4
Plan Prescribed Dose Per Fraction: 6 Gy
Plan Total Fractions Prescribed: 5
Plan Total Prescribed Dose: 30 Gy
Reference Point Dosage Given to Date: 24 Gy
Reference Point Session Dosage Given: 6 Gy
Session Number: 4

## 2023-07-28 ENCOUNTER — Other Ambulatory Visit: Payer: Self-pay

## 2023-07-28 ENCOUNTER — Ambulatory Visit
Admission: RE | Admit: 2023-07-28 | Discharge: 2023-07-28 | Disposition: A | Discharge status: Home / Self Care | Source: Ambulatory Visit | Attending: Radiation Oncology | Admitting: Radiation Oncology

## 2023-07-28 DIAGNOSIS — C7951 Secondary malignant neoplasm of bone: Secondary | ICD-10-CM | POA: Diagnosis not present

## 2023-07-28 LAB — RAD ONC ARIA SESSION SUMMARY
Course Elapsed Days: 14
Plan Fractions Treated to Date: 5
Plan Prescribed Dose Per Fraction: 6 Gy
Plan Total Fractions Prescribed: 5
Plan Total Prescribed Dose: 30 Gy
Reference Point Dosage Given to Date: 30 Gy
Reference Point Session Dosage Given: 6 Gy
Session Number: 5

## 2023-07-29 NOTE — Radiation Completion Notes (Signed)
 Patient Name: Kevin Santiago, Kevin Santiago MRN: [id number] Date of Birth: 1940/12/07 Referring Physician: [facility], M.D. Date of Service: 2023-07-29 Radiation Oncologist: Marcey Penton, M.D. [facility]                             RADIATION ONCOLOGY END OF TREATMENT NOTE     Diagnosis: C79.51 Secondary malignant neoplasm of bone Staging on 2022-06-10: Prostate cancer metastatic to bone (HCC) T=cTX, N=cN1, M=cM1 Intent: Palliative     HPI: Patient is an 83 year old male well-known to our department having received palliative radiation therapy to his left hemipelvis for stage IV adenocarcinoma the prostate.  He is seen today in follow-up.  I had ordered a.  PSMA PET scan back in April which showed again multifocal skeletal metastasis with an aggressive lesion in the right ischium.  He did have avid radiotracer uptake in the lumbar and T12 region.  The ischial met metastatic lesion is adjacent to the acetabulum with cortical destruction of the medial margin.  Patient has been somewhat lost to follow-up.  Today he complains of again right inguinal pain as well as mid back pain.  He is ambulating fairly well although does have a slight limp.  He is having no focal neurologic deficits.      ==========DELIVERED PLANS==========  First Treatment Date: 2023-07-14 Last Treatment Date: 2023-07-28   Plan Name: Pelvis_R_SBRT Site: Ischium Technique: SBRT/SRT-IMRT Mode: Photon Dose Per Fraction: 6 Gy Prescribed Dose (Delivered / Prescribed): 30 Gy / 30 Gy Prescribed Fxs (Delivered / Prescribed): 5 / 5     ==========ON TREATMENT VISIT DATES========== 2023-07-14, 2023-07-19, 2023-07-21, 2023-07-26, 2023-07-26, 2023-07-28     ==========UPCOMING VISITS==========       ==========APPENDIX - ON TREATMENT VISIT NOTES==========   See weekly On Treatment Notes in Epic for details in the Media tab (listed as Progress notes on the On Treatment Visit Dates listed above).

## 2023-08-19 ENCOUNTER — Telehealth: Payer: Self-pay | Admitting: *Deleted

## 2023-08-19 NOTE — Telephone Encounter (Signed)
 Patient called in saying that he feels like he has a pinch nerve on his right leg and it hurts and he wanted to see what Dr. Guadalupe can do.  Told him that MD was not here today.  I suggested that he see his PCP or urgent care.  Sometimes putting ice or heat on it to make it better.  If that does not help you should go to urgent care he understands

## 2023-08-22 ENCOUNTER — Telehealth: Payer: Self-pay | Admitting: *Deleted

## 2023-08-22 ENCOUNTER — Ambulatory Visit: Admitting: Radiation Oncology

## 2023-08-22 ENCOUNTER — Ambulatory Visit
Admission: RE | Admit: 2023-08-22 | Discharge: 2023-08-22 | Disposition: A | Discharge status: Home / Self Care | Source: Ambulatory Visit | Attending: Radiation Oncology | Admitting: Radiation Oncology

## 2023-08-22 ENCOUNTER — Encounter: Payer: Self-pay | Admitting: Radiation Oncology

## 2023-08-22 VITALS — BP 145/82 | HR 60 | Temp 96.5°F | Resp 16 | Ht 68.0 in | Wt 173.0 lb

## 2023-08-22 DIAGNOSIS — M79604 Pain in right leg: Secondary | ICD-10-CM | POA: Insufficient documentation

## 2023-08-22 DIAGNOSIS — Z923 Personal history of irradiation: Secondary | ICD-10-CM | POA: Diagnosis not present

## 2023-08-22 DIAGNOSIS — C61 Malignant neoplasm of prostate: Secondary | ICD-10-CM | POA: Insufficient documentation

## 2023-08-22 DIAGNOSIS — C7951 Secondary malignant neoplasm of bone: Secondary | ICD-10-CM | POA: Insufficient documentation

## 2023-08-22 NOTE — Progress Notes (Signed)
 Radiation Oncology Follow up Note  Name: Kevin Santiago   Date:   08/22/2023 MRN:  [id number] DOB: [birth year]    This 83 y.o. male presents to the clinic today for self-referral for increasing right lower extremity pain and patient with known stage IV prostate cancer.  Currently onPluvicto therapy   REFERRING PROVIDER: [facility]  HPI: Patient is a 83 year old male.  Now out approximately 2 months having completed SBRT to his right ischial region for for metastatic prostate cancer.  He had previously been treated to his left hemipelvis.  He has been initiated onPluvicto therapy and had 2 doses currently on hold secondary to thrombocytopenia.  He self-referred because of increasing right lower extremity pain.  He does have significant disease in his lumbar spine.  He is able to ambulate.  He is not on pain medication at this time.  COMPLICATIONS OF TREATMENT: none  FOLLOW UP COMPLIANCE: keeps appointments   PHYSICAL EXAM:  BP (!) 145/82   Pulse 60   Temp (!) 96.5 F (35.8 C) (Tympanic)   Resp 16   Ht 5' 8 (1.727 m)   Wt 173 lb (78.5 kg)   BMI 26.30 kg/m  Range of motion is right lower extremity does not elicit pain.  Motor and sensory levels are equal and symmetric in the lower extremities.  Deep palpation of the spine does not elicit pain.  Well-developed well-nourished patient in NAD. HEENT reveals PERLA, EOMI, discs not visualized.  Oral cavity is clear. No oral mucosal lesions are identified. Neck is clear without evidence of cervical or supraclavicular adenopathy. Lungs are clear to A&P. Cardiac examination is essentially unremarkable with regular rate and rhythm without murmur rub or thrill. Abdomen is benign with no organomegaly or masses noted. Motor sensory and DTR levels are equal and symmetric in the upper and lower extremities. Cranial nerves II through XII are grossly intact. Proprioception is intact. No peripheral adenopathy or edema is identified. No motor or sensory  levels are noted. Crude visual fields are within normal range.  RADIOLOGY RESULTS: Previous PSMA PET scan reviewed bone scan ordered  PLAN: This time of ordering a bone scan to better delineate possible areas of involvement in his lumbar spine which would benefit from palliative radiation therapy.  He currently is on hold from his  Pluvicto therapy and we will alert that should we decide on treating his spine.  Will see him back in follow-up right after his bone scan is performed.  He continues close follow-up care through the [place] and [facility].  Have asked him to contact them for pain management assistance and overall continuity of care.  Marcey Penton, MD

## 2023-08-22 NOTE — Telephone Encounter (Signed)
 Pt received oncology care at [facility]

## 2023-08-22 NOTE — Telephone Encounter (Signed)
 Patient is coming today at 1:30 for pain assesment.

## 2023-08-25 ENCOUNTER — Ambulatory Visit: Admitting: Radiation Oncology

## 2023-08-30 ENCOUNTER — Ambulatory Visit
Admission: RE | Admit: 2023-08-30 | Discharge: 2023-08-30 | Discharge status: Home / Self Care | Source: Ambulatory Visit | Attending: Radiation Oncology | Admitting: Radiation Oncology

## 2023-08-30 ENCOUNTER — Ambulatory Visit
Admission: RE | Admit: 2023-08-30 | Discharge: 2023-08-30 | Disposition: A | Discharge status: Home / Self Care | Source: Ambulatory Visit | Attending: Radiation Oncology | Admitting: Radiation Oncology

## 2023-08-30 DIAGNOSIS — C7951 Secondary malignant neoplasm of bone: Secondary | ICD-10-CM | POA: Diagnosis present

## 2023-08-30 MED ORDER — TECHNETIUM TC 99M MEDRONATE IV KIT
20.0000 | PACK | Freq: Once | INTRAVENOUS | Status: AC | PRN
  Administered 2023-08-30: 21.76 via INTRAVENOUS

## 2023-08-31 ENCOUNTER — Ambulatory Visit: Admitting: Radiation Oncology

## 2023-09-01 ENCOUNTER — Encounter: Payer: Self-pay | Admitting: Radiation Oncology

## 2023-09-01 ENCOUNTER — Ambulatory Visit
Admission: RE | Admit: 2023-09-01 | Discharge: 2023-09-01 | Disposition: A | Discharge status: Home / Self Care | Source: Ambulatory Visit | Attending: Radiation Oncology | Admitting: Radiation Oncology

## 2023-09-01 VITALS — BP 106/69 | HR 69 | Temp 97.5°F | Resp 16 | Wt 168.0 lb

## 2023-09-01 DIAGNOSIS — C7951 Secondary malignant neoplasm of bone: Secondary | ICD-10-CM

## 2023-09-01 NOTE — Progress Notes (Signed)
 Radiation Oncology Follow up Note  Name: Kevin Santiago   Date:   09/01/2023 MRN:  [id number] DOB: 13-Oct-1940    This 83 y.o. male presents to the clinic today for evaluation of repeat bone scan and patient with stage IV prostate cancer previously treated to his pelvis x 2 for metastatic disease now with back and right lower extremity pain.  REFERRING PROVIDER: [facility]  HPI: Patient is an 83 year old male well-known to department having received palliative radiation therapy to his pelvis x 2.  He has been undergoing Pluvicto treatments with his last infusion approximately 2 months ago.  He is also having some left shoulder pain.  I ran a bone scan which showed overall improvement in multifocal skeletal metastatic pattern although there is still intense radiotracer activity in the upper lumbar spine over 4 vertebral body levels.  Patient been having nausea he has been treated for that at the [place] although it persists he is lost about 10 pounds over the past 6 months.  He does have splenomegaly on his previous PSMA PET scan  COMPLICATIONS OF TREATMENT: none  FOLLOW UP COMPLIANCE: keeps appointments   PHYSICAL EXAM:  BP 106/69   Pulse 69   Temp (!) 97.5 F (36.4 C) (Tympanic)   Resp 16   Wt 168 lb (76.2 kg)   BMI 25.54 kg/m  Deep spine does not elicit pain motor and sensory levels in the lower extremities are equal and symmetric.  Range of motion was upper extremity does not elicit pain.  Well-developed well-nourished patient in NAD. HEENT reveals PERLA, EOMI, discs not visualized.  Oral cavity is clear. No oral mucosal lesions are identified. Neck is clear without evidence of cervical or supraclavicular adenopathy. Lungs are clear to A&P. Cardiac examination is essentially unremarkable with regular rate and rhythm without murmur rub or thrill. Abdomen is benign with no organomegaly or masses noted. Motor sensory and DTR levels are equal and symmetric in the upper and lower  extremities. Cranial nerves II through XII are grossly intact. Proprioception is intact. No peripheral adenopathy or edema is identified. No motor or sensory levels are noted. Crude visual fields are within normal range.  RADIOLOGY RESULTS: Bone scan and PSMA PET scan reviewed compatible with above-stated findings  PLAN: At this time I will treat his upper lumbar spine lower thoracic spine based on PSMA PET scan results to 30 Gray in 10 fractions risks and benefits of treatment including possible diarrhea fatigue skin reaction alteration of blood counts all were reviewed in detail with the patient.  I do not see anything on CT scan or or bone scan to suggest any bone metastasis in his left scapula or left shoulder region.  Patient comprehends my recommendations well.  Simulation point was given.  I have asked him to try some Pepto-Bismol for his nausea to see if that has any effect.  He also is at occasionally taking a proton pump inhibitor.  I would like to take this opportunity to thank you for allowing me to participate in the care of your patient.SABRA Marcey Penton, MD

## 2023-09-06 ENCOUNTER — Ambulatory Visit
Admission: RE | Admit: 2023-09-06 | Discharge: 2023-09-06 | Disposition: A | Discharge status: Home / Self Care | Source: Ambulatory Visit | Attending: Radiation Oncology | Admitting: Radiation Oncology

## 2023-09-06 DIAGNOSIS — M79604 Pain in right leg: Secondary | ICD-10-CM | POA: Diagnosis not present

## 2023-09-06 DIAGNOSIS — Z923 Personal history of irradiation: Secondary | ICD-10-CM | POA: Insufficient documentation

## 2023-09-06 DIAGNOSIS — Z510 Encounter for antineoplastic radiation therapy: Secondary | ICD-10-CM | POA: Insufficient documentation

## 2023-09-06 DIAGNOSIS — C61 Malignant neoplasm of prostate: Secondary | ICD-10-CM | POA: Diagnosis not present

## 2023-09-06 DIAGNOSIS — C7951 Secondary malignant neoplasm of bone: Secondary | ICD-10-CM | POA: Insufficient documentation

## 2023-09-07 DIAGNOSIS — Z510 Encounter for antineoplastic radiation therapy: Secondary | ICD-10-CM | POA: Diagnosis not present

## 2023-09-12 ENCOUNTER — Other Ambulatory Visit: Payer: Self-pay

## 2023-09-12 ENCOUNTER — Ambulatory Visit
Admission: RE | Admit: 2023-09-12 | Discharge: 2023-09-12 | Disposition: A | Discharge status: Home / Self Care | Source: Ambulatory Visit | Attending: Radiation Oncology | Admitting: Radiation Oncology

## 2023-09-12 DIAGNOSIS — Z510 Encounter for antineoplastic radiation therapy: Secondary | ICD-10-CM | POA: Diagnosis not present

## 2023-09-12 LAB — RAD ONC ARIA SESSION SUMMARY
Course Elapsed Days: 0
Plan Fractions Treated to Date: 1
Plan Prescribed Dose Per Fraction: 3 Gy
Plan Total Fractions Prescribed: 10
Plan Total Prescribed Dose: 30 Gy
Reference Point Dosage Given to Date: 3 Gy
Reference Point Session Dosage Given: 3 Gy
Session Number: 1

## 2023-09-13 ENCOUNTER — Ambulatory Visit
Admission: RE | Admit: 2023-09-13 | Discharge: 2023-09-13 | Disposition: A | Discharge status: Home / Self Care | Source: Ambulatory Visit | Attending: Radiation Oncology | Admitting: Radiation Oncology

## 2023-09-13 ENCOUNTER — Other Ambulatory Visit: Payer: Self-pay

## 2023-09-13 DIAGNOSIS — Z510 Encounter for antineoplastic radiation therapy: Secondary | ICD-10-CM | POA: Diagnosis not present

## 2023-09-13 LAB — RAD ONC ARIA SESSION SUMMARY
Course Elapsed Days: 1
Plan Fractions Treated to Date: 2
Plan Prescribed Dose Per Fraction: 3 Gy
Plan Total Fractions Prescribed: 10
Plan Total Prescribed Dose: 30 Gy
Reference Point Dosage Given to Date: 6 Gy
Reference Point Session Dosage Given: 3 Gy
Session Number: 2

## 2023-09-14 ENCOUNTER — Other Ambulatory Visit: Payer: Self-pay

## 2023-09-14 ENCOUNTER — Ambulatory Visit
Admission: RE | Admit: 2023-09-14 | Discharge: 2023-09-14 | Disposition: A | Discharge status: Home / Self Care | Source: Ambulatory Visit | Attending: Radiation Oncology | Admitting: Radiation Oncology

## 2023-09-14 DIAGNOSIS — Z510 Encounter for antineoplastic radiation therapy: Secondary | ICD-10-CM | POA: Diagnosis not present

## 2023-09-14 LAB — RAD ONC ARIA SESSION SUMMARY
Course Elapsed Days: 2
Plan Fractions Treated to Date: 3
Plan Prescribed Dose Per Fraction: 3 Gy
Plan Total Fractions Prescribed: 10
Plan Total Prescribed Dose: 30 Gy
Reference Point Dosage Given to Date: 9 Gy
Reference Point Session Dosage Given: 3 Gy
Session Number: 3

## 2023-09-15 ENCOUNTER — Ambulatory Visit
Admission: RE | Admit: 2023-09-15 | Discharge: 2023-09-15 | Disposition: A | Discharge status: Home / Self Care | Source: Ambulatory Visit | Attending: Radiation Oncology | Admitting: Radiation Oncology

## 2023-09-15 ENCOUNTER — Other Ambulatory Visit: Payer: Self-pay

## 2023-09-15 DIAGNOSIS — Z510 Encounter for antineoplastic radiation therapy: Secondary | ICD-10-CM | POA: Diagnosis not present

## 2023-09-15 LAB — RAD ONC ARIA SESSION SUMMARY
Course Elapsed Days: 3
Plan Fractions Treated to Date: 4
Plan Prescribed Dose Per Fraction: 3 Gy
Plan Total Fractions Prescribed: 10
Plan Total Prescribed Dose: 30 Gy
Reference Point Dosage Given to Date: 12 Gy
Reference Point Session Dosage Given: 3 Gy
Session Number: 4

## 2023-09-16 ENCOUNTER — Ambulatory Visit
Admission: RE | Admit: 2023-09-16 | Discharge: 2023-09-16 | Disposition: A | Discharge status: Home / Self Care | Source: Ambulatory Visit | Attending: Radiation Oncology | Admitting: Radiation Oncology

## 2023-09-16 ENCOUNTER — Other Ambulatory Visit: Payer: Self-pay

## 2023-09-16 DIAGNOSIS — Z510 Encounter for antineoplastic radiation therapy: Secondary | ICD-10-CM | POA: Diagnosis not present

## 2023-09-16 LAB — RAD ONC ARIA SESSION SUMMARY
Course Elapsed Days: 4
Plan Fractions Treated to Date: 5
Plan Prescribed Dose Per Fraction: 3 Gy
Plan Total Fractions Prescribed: 10
Plan Total Prescribed Dose: 30 Gy
Reference Point Dosage Given to Date: 15 Gy
Reference Point Session Dosage Given: 3 Gy
Session Number: 5

## 2023-09-19 ENCOUNTER — Other Ambulatory Visit: Payer: Self-pay | Admitting: *Deleted

## 2023-09-19 ENCOUNTER — Ambulatory Visit
Admission: RE | Admit: 2023-09-19 | Discharge: 2023-09-19 | Disposition: A | Discharge status: Home / Self Care | Source: Ambulatory Visit | Attending: Radiation Oncology | Admitting: Radiation Oncology

## 2023-09-19 ENCOUNTER — Inpatient Hospital Stay: Attending: Radiation Oncology

## 2023-09-19 ENCOUNTER — Other Ambulatory Visit: Payer: Self-pay

## 2023-09-19 DIAGNOSIS — Z510 Encounter for antineoplastic radiation therapy: Secondary | ICD-10-CM | POA: Diagnosis not present

## 2023-09-19 DIAGNOSIS — Z79899 Other long term (current) drug therapy: Secondary | ICD-10-CM | POA: Insufficient documentation

## 2023-09-19 DIAGNOSIS — C7951 Secondary malignant neoplasm of bone: Secondary | ICD-10-CM | POA: Insufficient documentation

## 2023-09-19 DIAGNOSIS — C61 Malignant neoplasm of prostate: Secondary | ICD-10-CM | POA: Diagnosis present

## 2023-09-19 LAB — CBC (CANCER CENTER ONLY)
HCT: 35.4 % — ABNORMAL LOW (ref 39.0–52.0)
Hemoglobin: 11.7 g/dL — ABNORMAL LOW (ref 13.0–17.0)
MCH: 26.5 pg (ref 26.0–34.0)
MCHC: 33.1 g/dL (ref 30.0–36.0)
MCV: 80.1 fL (ref 80.0–100.0)
Platelet Count: 104 K/uL — ABNORMAL LOW (ref 150–400)
RBC: 4.42 MIL/uL (ref 4.22–5.81)
RDW: 14.9 % (ref 11.5–15.5)
WBC Count: 3.7 K/uL — ABNORMAL LOW (ref 4.0–10.5)
nRBC: 0 % (ref 0.0–0.2)

## 2023-09-19 LAB — RAD ONC ARIA SESSION SUMMARY
Course Elapsed Days: 7
Plan Fractions Treated to Date: 6
Plan Prescribed Dose Per Fraction: 3 Gy
Plan Total Fractions Prescribed: 10
Plan Total Prescribed Dose: 30 Gy
Reference Point Dosage Given to Date: 18 Gy
Reference Point Session Dosage Given: 3 Gy
Session Number: 6

## 2023-09-20 ENCOUNTER — Other Ambulatory Visit: Payer: Self-pay

## 2023-09-20 ENCOUNTER — Ambulatory Visit
Admission: RE | Admit: 2023-09-20 | Discharge: 2023-09-20 | Disposition: A | Discharge status: Home / Self Care | Source: Ambulatory Visit | Attending: Radiation Oncology | Admitting: Radiation Oncology

## 2023-09-20 DIAGNOSIS — Z510 Encounter for antineoplastic radiation therapy: Secondary | ICD-10-CM | POA: Diagnosis not present

## 2023-09-20 LAB — RAD ONC ARIA SESSION SUMMARY
Course Elapsed Days: 8
Plan Fractions Treated to Date: 7
Plan Prescribed Dose Per Fraction: 3 Gy
Plan Total Fractions Prescribed: 10
Plan Total Prescribed Dose: 30 Gy
Reference Point Dosage Given to Date: 21 Gy
Reference Point Session Dosage Given: 3 Gy
Session Number: 7

## 2023-09-21 ENCOUNTER — Other Ambulatory Visit: Payer: Self-pay

## 2023-09-21 ENCOUNTER — Ambulatory Visit
Admission: RE | Admit: 2023-09-21 | Discharge: 2023-09-21 | Disposition: A | Discharge status: Home / Self Care | Source: Ambulatory Visit | Attending: Radiation Oncology | Admitting: Radiation Oncology

## 2023-09-21 DIAGNOSIS — Z510 Encounter for antineoplastic radiation therapy: Secondary | ICD-10-CM | POA: Diagnosis not present

## 2023-09-21 LAB — RAD ONC ARIA SESSION SUMMARY
Course Elapsed Days: 9
Plan Fractions Treated to Date: 8
Plan Prescribed Dose Per Fraction: 3 Gy
Plan Total Fractions Prescribed: 10
Plan Total Prescribed Dose: 30 Gy
Reference Point Dosage Given to Date: 24 Gy
Reference Point Session Dosage Given: 3 Gy
Session Number: 8

## 2023-09-22 ENCOUNTER — Other Ambulatory Visit: Payer: Self-pay

## 2023-09-22 ENCOUNTER — Ambulatory Visit
Admission: RE | Admit: 2023-09-22 | Discharge: 2023-09-22 | Disposition: A | Discharge status: Home / Self Care | Source: Ambulatory Visit | Attending: Radiation Oncology | Admitting: Radiation Oncology

## 2023-09-22 DIAGNOSIS — Z510 Encounter for antineoplastic radiation therapy: Secondary | ICD-10-CM | POA: Diagnosis not present

## 2023-09-22 LAB — RAD ONC ARIA SESSION SUMMARY
Course Elapsed Days: 10
Plan Fractions Treated to Date: 9
Plan Prescribed Dose Per Fraction: 3 Gy
Plan Total Fractions Prescribed: 10
Plan Total Prescribed Dose: 30 Gy
Reference Point Dosage Given to Date: 27 Gy
Reference Point Session Dosage Given: 3 Gy
Session Number: 9

## 2023-09-23 ENCOUNTER — Other Ambulatory Visit: Payer: Self-pay

## 2023-09-23 ENCOUNTER — Ambulatory Visit
Admission: RE | Admit: 2023-09-23 | Discharge: 2023-09-23 | Disposition: A | Discharge status: Home / Self Care | Source: Ambulatory Visit | Attending: Radiation Oncology | Admitting: Radiation Oncology

## 2023-09-23 DIAGNOSIS — Z510 Encounter for antineoplastic radiation therapy: Secondary | ICD-10-CM | POA: Diagnosis not present

## 2023-09-23 LAB — RAD ONC ARIA SESSION SUMMARY
Course Elapsed Days: 11
Plan Fractions Treated to Date: 10
Plan Prescribed Dose Per Fraction: 3 Gy
Plan Total Fractions Prescribed: 10
Plan Total Prescribed Dose: 30 Gy
Reference Point Dosage Given to Date: 30 Gy
Reference Point Session Dosage Given: 3 Gy
Session Number: 10

## 2023-09-26 NOTE — Radiation Completion Notes (Signed)
 Patient Name: Kevin Santiago, Kevin Santiago MRN: [id number] Date of Birth: 08-01-1940 Referring Physician: [facility], M.D. Date of Service: 2023-09-26 Radiation Oncologist: Marcey Penton, M.D. [facility]                             RADIATION ONCOLOGY END OF TREATMENT NOTE     Diagnosis: C79.51 Secondary malignant neoplasm of bone Staging on 2022-06-10: Prostate cancer metastatic to bone (HCC) T=cTX, N=cN1, M=cM1 Intent: Palliative     HPI: Patient is an 83 year old male well-known to department having received palliative radiation therapy to his pelvis x 2.  He has been undergoing Pluvicto treatments with his last infusion approximately 2 months ago.  He is also having some left shoulder pain.  I ran a bone scan which showed overall improvement in multifocal skeletal metastatic pattern although there is still intense radiotracer activity in the upper lumbar spine over 4 vertebral body levels.  Patient been having nausea he has been treated for that at the [place] although it persists he is lost about 10 pounds over the past 6 months.  He does have splenomegaly on his previous PSMA PET scan      ==========DELIVERED PLANS==========  First Treatment Date: 2023-09-12 Last Treatment Date: 2023-09-23   Plan Name: Spine Site: Lumbar Spine Technique: 3D Mode: Photon Dose Per Fraction: 3 Gy Prescribed Dose (Delivered / Prescribed): 30 Gy / 30 Gy Prescribed Fxs (Delivered / Prescribed): 10 / 10     ==========ON TREATMENT VISIT DATES========== 2023-09-13, 2023-09-20     ==========UPCOMING VISITS========== 10/24/2023 [facility] RAD ONCOLOGY FOLLOW UP 30 Chrystal, Glenn, MD        ==========APPENDIX - ON TREATMENT VISIT NOTES==========   See weekly On Treatment Notes in Epic for details in the Media tab (listed as Progress notes on the On Treatment Visit Dates listed above).

## 2023-10-24 ENCOUNTER — Telehealth: Payer: Self-pay | Admitting: Oncology

## 2023-10-24 ENCOUNTER — Encounter: Payer: Self-pay | Admitting: Radiation Oncology

## 2023-10-24 ENCOUNTER — Ambulatory Visit
Admission: RE | Admit: 2023-10-24 | Discharge: 2023-10-24 | Disposition: A | Discharge status: Home / Self Care | Source: Ambulatory Visit | Attending: Radiation Oncology | Admitting: Radiation Oncology

## 2023-10-24 VITALS — BP 131/71 | HR 62 | Temp 98.0°F | Resp 15 | Ht 68.0 in | Wt 158.7 lb

## 2023-10-24 DIAGNOSIS — C61 Malignant neoplasm of prostate: Secondary | ICD-10-CM | POA: Insufficient documentation

## 2023-10-24 DIAGNOSIS — C7951 Secondary malignant neoplasm of bone: Secondary | ICD-10-CM | POA: Diagnosis present

## 2023-10-24 DIAGNOSIS — Z510 Encounter for antineoplastic radiation therapy: Secondary | ICD-10-CM | POA: Insufficient documentation

## 2023-10-24 NOTE — Progress Notes (Signed)
 Radiation Oncology Follow up Note  Name: Kevin Santiago   Date:   10/24/2023 MRN:  [id number] DOB: 12-06-1940    This 83 y.o. male presents to the clinic today for 1 month follow-up status post palliative radiation therapy.  2 is upper lumbar lower thoracic spine and patient with stage IV prostate cancer  REFERRING PROVID[facility]  HPI: Patient is an 83 year old male now out 1 month having completed palliative radiation therapy to his lower thoracic upper lumbar spine for metastatic involvement of prostate cancer.  He is received multiple areas of treatment in the past including his pelvis and other areas of his spine.  He had a good palliative benefit to the radiation therapy although today is complaining of left scapular shoulder pain.  On his PSMA PET scan back in.  April he did have activity in the left scapula.  He did have Pluvicto treatments back in May although only only underwent 1 treatment and had to be discontinued secondary to his blood counts.  COMPLICATIONS OF TREATMENT: none  FOLLOW UP COMPLIANCE: keeps appointments   PHYSICAL EXAM:  BP 131/71   Pulse 62   Temp 98 F (36.7 C) (Tympanic)   Resp 15   Ht 5' 8 (1.727 m)   Wt 158 lb 11.2 oz (72 kg)   BMI 24.13 kg/m  Range of motion of his upper extremity does not elicit pain deep palpation of his left scapula does elicit some pain.  Palpation of the spine does not elicit pain.  Motor and sensory levels in the lower extremities are equal and symmetric.  Well-developed well-nourished patient in NAD. HEENT reveals PERLA, EOMI, discs not visualized.  Oral cavity is clear. No oral mucosal lesions are identified. Neck is clear without evidence of cervical or supraclavicular adenopathy. Lungs are clear to A&P. Cardiac examination is essentially unremarkable with regular rate and rhythm without murmur rub or thrill. Abdomen is benign with no organomegaly or masses noted. Motor sensory and DTR levels are equal and symmetric  in the upper and lower extremities. Cranial nerves II through XII are grossly intact. Proprioception is intact. No peripheral adenopathy or edema is identified. No motor or sensory levels are noted. Crude visual fields are within normal range.  RADIOLOGY RESULTS: PSMA PET scan and bone scan both reviewed compatible with above-stated findings  PLAN: At this time elect to treat his left scapula based on his original PSMA PET scan findings back in April.  Would plan on delivering 20 Gray in 5 fractions.  Risks and benefits of treatment occluding possible fatigue skin reaction were reviewed with the patient.  And also setting him up to return to Dr. Babara for continuity of care.  Patient My recommendations well.  I would like to take this opportunity to thank you for allowing me to participate in the care of your patient.SABRA Marcey Penton, MD

## 2023-10-24 NOTE — Telephone Encounter (Signed)
 Per secure chat from Luke Lento, RN Dr. Lenn would like for this patient to be seen again by Dr. Babara, please. Prostate metastatic disease   I have scheduled appt for Dr.Yu on 9/30.  I have spoken with Kevin Santiago and confirmed date and time. Kevin Santiago stated he will also see this appt in mychart.

## 2023-10-26 ENCOUNTER — Ambulatory Visit
Admission: RE | Admit: 2023-10-26 | Discharge: 2023-10-26 | Discharge status: Home / Self Care | Source: Ambulatory Visit | Attending: Radiation Oncology | Admitting: Radiation Oncology

## 2023-10-26 DIAGNOSIS — Z510 Encounter for antineoplastic radiation therapy: Secondary | ICD-10-CM | POA: Diagnosis not present

## 2023-10-29 DIAGNOSIS — Z510 Encounter for antineoplastic radiation therapy: Secondary | ICD-10-CM | POA: Diagnosis not present

## 2023-10-31 ENCOUNTER — Ambulatory Visit
Admission: RE | Admit: 2023-10-31 | Discharge: 2023-10-31 | Disposition: A | Source: Ambulatory Visit | Attending: Radiation Oncology | Admitting: Radiation Oncology

## 2023-10-31 ENCOUNTER — Other Ambulatory Visit: Payer: Self-pay

## 2023-10-31 DIAGNOSIS — Z510 Encounter for antineoplastic radiation therapy: Secondary | ICD-10-CM | POA: Diagnosis not present

## 2023-10-31 LAB — RAD ONC ARIA SESSION SUMMARY
Course Elapsed Days: 0
Plan Fractions Treated to Date: 1
Plan Prescribed Dose Per Fraction: 4 Gy
Plan Total Fractions Prescribed: 5
Plan Total Prescribed Dose: 20 Gy
Reference Point Dosage Given to Date: 4 Gy
Reference Point Session Dosage Given: 4 Gy
Session Number: 1

## 2023-11-01 ENCOUNTER — Inpatient Hospital Stay: Attending: Radiation Oncology | Admitting: Oncology

## 2023-11-01 ENCOUNTER — Other Ambulatory Visit: Payer: Self-pay

## 2023-11-01 ENCOUNTER — Ambulatory Visit
Admission: RE | Admit: 2023-11-01 | Discharge: 2023-11-01 | Disposition: A | Source: Ambulatory Visit | Attending: Radiation Oncology | Admitting: Radiation Oncology

## 2023-11-01 VITALS — BP 130/59 | HR 70 | Temp 98.4°F | Resp 16 | Wt 155.4 lb

## 2023-11-01 DIAGNOSIS — Z7952 Long term (current) use of systemic steroids: Secondary | ICD-10-CM | POA: Diagnosis not present

## 2023-11-01 DIAGNOSIS — Z87891 Personal history of nicotine dependence: Secondary | ICD-10-CM | POA: Insufficient documentation

## 2023-11-01 DIAGNOSIS — D709 Neutropenia, unspecified: Secondary | ICD-10-CM | POA: Insufficient documentation

## 2023-11-01 DIAGNOSIS — C7951 Secondary malignant neoplasm of bone: Secondary | ICD-10-CM | POA: Diagnosis present

## 2023-11-01 DIAGNOSIS — Z79899 Other long term (current) drug therapy: Secondary | ICD-10-CM | POA: Insufficient documentation

## 2023-11-01 DIAGNOSIS — C61 Malignant neoplasm of prostate: Secondary | ICD-10-CM | POA: Insufficient documentation

## 2023-11-01 DIAGNOSIS — I10 Essential (primary) hypertension: Secondary | ICD-10-CM | POA: Insufficient documentation

## 2023-11-01 DIAGNOSIS — Z510 Encounter for antineoplastic radiation therapy: Secondary | ICD-10-CM | POA: Diagnosis not present

## 2023-11-01 LAB — RAD ONC ARIA SESSION SUMMARY
Course Elapsed Days: 1
Plan Fractions Treated to Date: 2
Plan Prescribed Dose Per Fraction: 4 Gy
Plan Total Fractions Prescribed: 5
Plan Total Prescribed Dose: 20 Gy
Reference Point Dosage Given to Date: 8 Gy
Reference Point Session Dosage Given: 4 Gy
Session Number: 2

## 2023-11-01 NOTE — Assessment & Plan Note (Signed)
 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 He is currently following up with [facility] oncology.  Patient would like to get a second opinion and be treated at our cancer center.  Recommend patient to call his [facility] insurance to see if our cancer center is in his network.   Patient has progressed after first-line treatment for Darolutamide, second line treatment with Pluvicto.  Additionally Pluvicto was not given due to neutropenia.  If his insurance allows him to get oncology care here, I plan to repeat lab work with CBC, CMP PSA. Patient is reluctant to consider chemotherapy docetaxel Consider repeat liquid NGS testing.  Would like to obtain previous NGS result copy.
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
## 2023-11-01 NOTE — Assessment & Plan Note (Signed)
 Consider bone strengthening agents

## 2023-11-02 ENCOUNTER — Other Ambulatory Visit: Payer: Self-pay

## 2023-11-02 ENCOUNTER — Ambulatory Visit
Admission: RE | Admit: 2023-11-02 | Discharge: 2023-11-02 | Disposition: A | Discharge status: Home / Self Care | Source: Ambulatory Visit | Attending: Radiation Oncology | Admitting: Radiation Oncology

## 2023-11-02 DIAGNOSIS — C61 Malignant neoplasm of prostate: Secondary | ICD-10-CM | POA: Diagnosis present

## 2023-11-02 DIAGNOSIS — C7951 Secondary malignant neoplasm of bone: Secondary | ICD-10-CM | POA: Diagnosis present

## 2023-11-02 DIAGNOSIS — Z510 Encounter for antineoplastic radiation therapy: Secondary | ICD-10-CM | POA: Diagnosis not present

## 2023-11-02 LAB — RAD ONC ARIA SESSION SUMMARY
Course Elapsed Days: 2
Plan Fractions Treated to Date: 3
Plan Prescribed Dose Per Fraction: 4 Gy
Plan Total Fractions Prescribed: 5
Plan Total Prescribed Dose: 20 Gy
Reference Point Dosage Given to Date: 12 Gy
Reference Point Session Dosage Given: 4 Gy
Session Number: 3

## 2023-11-03 ENCOUNTER — Ambulatory Visit
Admission: RE | Admit: 2023-11-03 | Discharge: 2023-11-03 | Disposition: A | Source: Ambulatory Visit | Attending: Radiation Oncology | Admitting: Radiation Oncology

## 2023-11-03 ENCOUNTER — Other Ambulatory Visit: Payer: Self-pay

## 2023-11-03 DIAGNOSIS — Z510 Encounter for antineoplastic radiation therapy: Secondary | ICD-10-CM | POA: Diagnosis not present

## 2023-11-03 LAB — RAD ONC ARIA SESSION SUMMARY
Course Elapsed Days: 3
Plan Fractions Treated to Date: 4
Plan Prescribed Dose Per Fraction: 4 Gy
Plan Total Fractions Prescribed: 5
Plan Total Prescribed Dose: 20 Gy
Reference Point Dosage Given to Date: 16 Gy
Reference Point Session Dosage Given: 4 Gy
Session Number: 4

## 2023-11-04 ENCOUNTER — Ambulatory Visit
Admission: RE | Admit: 2023-11-04 | Discharge: 2023-11-04 | Disposition: A | Discharge status: Home / Self Care | Source: Ambulatory Visit | Attending: Radiation Oncology | Admitting: Radiation Oncology

## 2023-11-04 ENCOUNTER — Other Ambulatory Visit: Payer: Self-pay

## 2023-11-04 DIAGNOSIS — Z510 Encounter for antineoplastic radiation therapy: Secondary | ICD-10-CM | POA: Diagnosis not present

## 2023-11-04 LAB — RAD ONC ARIA SESSION SUMMARY
Course Elapsed Days: 4
Plan Fractions Treated to Date: 5
Plan Prescribed Dose Per Fraction: 4 Gy
Plan Total Fractions Prescribed: 5
Plan Total Prescribed Dose: 20 Gy
Reference Point Dosage Given to Date: 20 Gy
Reference Point Session Dosage Given: 4 Gy
Session Number: 5

## 2023-11-07 ENCOUNTER — Ambulatory Visit

## 2023-11-09 ENCOUNTER — Emergency Department (HOSPITAL_COMMUNITY): Admission: EM | Admit: 2023-11-09 | Discharge: 2023-11-09 | Disposition: A | Discharge status: Home / Self Care

## 2023-11-09 ENCOUNTER — Other Ambulatory Visit: Payer: Self-pay

## 2023-11-09 ENCOUNTER — Encounter (HOSPITAL_COMMUNITY): Payer: Self-pay

## 2023-11-09 ENCOUNTER — Emergency Department (HOSPITAL_COMMUNITY)

## 2023-11-09 DIAGNOSIS — R1032 Left lower quadrant pain: Secondary | ICD-10-CM | POA: Insufficient documentation

## 2023-11-09 DIAGNOSIS — M545 Low back pain, unspecified: Secondary | ICD-10-CM

## 2023-11-09 DIAGNOSIS — X58XXXA Exposure to other specified factors, initial encounter: Secondary | ICD-10-CM | POA: Diagnosis not present

## 2023-11-09 DIAGNOSIS — C7951 Secondary malignant neoplasm of bone: Secondary | ICD-10-CM | POA: Insufficient documentation

## 2023-11-09 DIAGNOSIS — C61 Malignant neoplasm of prostate: Secondary | ICD-10-CM | POA: Insufficient documentation

## 2023-11-09 DIAGNOSIS — S32010A Wedge compression fracture of first lumbar vertebra, initial encounter for closed fracture: Secondary | ICD-10-CM | POA: Insufficient documentation

## 2023-11-09 LAB — COMPREHENSIVE METABOLIC PANEL WITH GFR
ALT: 14 U/L (ref 0–44)
AST: 31 U/L (ref 15–41)
Albumin: 4 g/dL (ref 3.5–5.0)
Alkaline Phosphatase: 166 U/L — ABNORMAL HIGH (ref 38–126)
Anion gap: 9 (ref 5–15)
BUN: 21 mg/dL (ref 8–23)
CO2: 18 mmol/L — ABNORMAL LOW (ref 22–32)
Calcium: 9 mg/dL (ref 8.9–10.3)
Chloride: 107 mmol/L (ref 98–111)
Creatinine, Ser: 0.97 mg/dL (ref 0.61–1.24)
GFR, Estimated: >60 mL/min (ref >60)
Glucose, Bld: 108 mg/dL — ABNORMAL HIGH (ref 70–99)
Potassium: 4.3 mmol/L (ref 3.5–5.1)
Sodium: 134 mmol/L — ABNORMAL LOW (ref 135–145)
Total Bilirubin: 0.8 mg/dL (ref 0.0–1.2)
Total Protein: 6.7 g/dL (ref 6.5–8.1)

## 2023-11-09 LAB — CBC
HCT: 33 % — ABNORMAL LOW (ref 39.0–52.0)
Hemoglobin: 10.9 g/dL — ABNORMAL LOW (ref 13.0–17.0)
MCH: 26.1 pg (ref 26.0–34.0)
MCHC: 33 g/dL (ref 30.0–36.0)
MCV: 79.1 fL — ABNORMAL LOW (ref 80.0–100.0)
Platelets: 114 K/uL — ABNORMAL LOW (ref 150–400)
RBC: 4.17 MIL/uL — ABNORMAL LOW (ref 4.22–5.81)
RDW: 15.7 % — ABNORMAL HIGH (ref 11.5–15.5)
WBC: 3.1 K/uL — ABNORMAL LOW (ref 4.0–10.5)
nRBC: 0 % (ref 0.0–0.2)

## 2023-11-09 MED ORDER — KETOROLAC TROMETHAMINE 15 MG/ML IJ SOLN
15.0000 mg | Freq: Once | INTRAMUSCULAR | Status: AC
  Administered 2023-11-09: 15 mg via INTRAMUSCULAR

## 2023-11-09 MED ORDER — KETOROLAC TROMETHAMINE 15 MG/ML IJ SOLN
15.0000 mg | Freq: Once | INTRAMUSCULAR | Status: DC
  Filled 2023-11-09: qty 1

## 2023-11-09 MED ORDER — OXYCODONE HCL 5 MG PO TABS
10.0000 mg | ORAL_TABLET | Freq: Once | ORAL | Status: AC
  Administered 2023-11-09: 10 mg via ORAL
  Filled 2023-11-09: qty 2

## 2023-11-09 MED ORDER — BACLOFEN 10 MG PO TABS: 10.0000 mg | ORAL_TABLET | Freq: Three times a day (TID) | ORAL | 0 refills | Status: AC

## 2023-11-09 NOTE — ED Notes (Signed)
 Patient discharged by RN. Patient verbalizes understanding of discharge instructions. In lobby to be picked up by family due to pain medicine admin.

## 2023-11-09 NOTE — ED Triage Notes (Signed)
 Pt is coming in for lower back pain, he has [facility] and was told if he started having increasing back pain that he should come into the ER. He mentions the back pain has been on going x 2 days and worsening today. Pt is otherwise stable at this time in triage and does regularly take oxycodone  in which his last dose was 10pm

## 2023-11-09 NOTE — ED Notes (Signed)
 Outside vendor placed LSO brace

## 2023-11-09 NOTE — Progress Notes (Signed)
 Orthopedic Tech Progress Note Patient Details:  Kevin Santiago 1940-04-17 [id number]  Called in order to [facility] for a STAT LSO BACK BRACE   Patient ID: Kevin Santiago, male   DOB: 1940-05-30, 83 y.o.   MRN: [id number]  Delanna LITTIE Pac 11/09/2023, 8:09 AM

## 2023-11-09 NOTE — ED Notes (Signed)
 Outside vendor at bedside

## 2023-11-09 NOTE — ED Provider Notes (Signed)
 [place] EMERGENCY DEPARTMENT AT [facility] Provider Note   CSN: [id number] Arrival date & time: 11/09/23  9671     Patient presents with: Back Pain   Kevin Santiago is a 83 y.o. male.   83 year old male presents with complaint of low back pain, history of metastatic cancer (care through [facility] in [place] as well as hospice through the [facility]). Baseline back pain, now worse over the past 2 days without fall/injury. Care team called in Oxycodone  yesterday. Pain is generally worse at night, sleeping in a recliner. Denies saddle paresthesia, loss of bowel or bladder control, abdominal pain, leg weakness.        Prior to Admission medications   Medication Sig Start Date End Date Taking? Authorizing Provider  baclofen (LIORESAL) 10 MG tablet Take 1 tablet (10 mg total) by mouth 3 (three) times daily. 11/09/23  Yes Beverley Leita DELENA, PA-C  acetaminophen  (TYLENOL ) 500 MG tablet Take 1,000 mg by mouth every 6 (six) hours as needed for moderate pain.    [provider]  atorvastatin  (LIPITOR ) 80 MG tablet Take 80 mg by mouth daily. Patient not taking: Reported on 11/01/2023    [provider]  Cholecalciferol  (VITAMIN D ) 50 MCG (2000 UT) tablet Take 4,000 Units by mouth daily. Patient not taking: Reported on 11/01/2023    [provider]  darolutamide (NUBEQA) 300 MG tablet Take 300 mg by mouth 2 (two) times daily with a meal. 10/19/22   [provider]  dexamethasone  (DECADRON ) 2 MG tablet Take 2 mg by mouth 2 (two) times daily with a meal.    [provider]  docusate sodium  (COLACE) 50 MG capsule Take 100 mg by mouth 2 (two) times daily.    [provider]  famotidine (PEPCID) 40 MG tablet Take 40 mg by mouth daily. 07/08/22   [provider]  ferrous sulfate 325 (65 FE) MG tablet Take 325 mg by mouth daily as needed (energy). Patient not taking: Reported on 11/01/2023 02/29/20   [provider]  fluorouracil (EFUDEX)  5 % cream Apply 1 application  topically daily as needed. Skin basil cell areas as directed 07/18/20   [provider]  hydroxypropyl methylcellulose (ISOPTO TEARS) 2.5 % ophthalmic solution Place 2 drops into both eyes 2 (two) times daily as needed for dry eyes.    [provider]  losartan (COZAAR) 50 MG tablet Take 50 mg by mouth daily. Patient not taking: Reported on 11/01/2023    [provider]  omeprazole (PRILOSEC) 20 MG capsule Take 20 mg by mouth daily as needed (acid reflux).    [provider]  oxybutynin (DITROPAN-XL) 5 MG 24 hr tablet TAKE ONE TABLET BY MOUTH DAILY FOR OVERACTIVE BLADDER 04/12/22   [provider]  pravastatin  (PRAVACHOL ) 80 MG tablet Take 80 mg by mouth daily. Patient not taking: Reported on 11/01/2023    [provider]  sertraline  (ZOLOFT ) 100 MG tablet Take 100 mg by mouth 2 (two) times daily.    [provider]  tamsulosin  (FLOMAX ) 0.4 MG CAPS capsule Take 0.4 mg by mouth at bedtime.    [provider]  terbinafine (LAMISIL) 1 % cream Apply 1 application  topically 2 (two) times daily as needed (itching).    [provider]  ticagrelor  (BRILINTA ) 90 MG TABS tablet Take 90 mg by mouth 2 (two) times daily. 05/11/22   [provider]    Allergies: Alendronate sodium, Nitroglycerin , Simvastatin, Other, and Penicillins  Review of Systems Negative except as per HPI Updated Vital Signs BP 135/76   Pulse (!) 58   Temp 97.8 F (36.6 C) (Oral)   Resp 16   Ht 5' 8 (1.727 m)   Wt 68.9 kg   SpO2 95%   BMI 23.11 kg/m   Physical Exam Vitals and nursing note reviewed.  Constitutional:      General: He is not in acute distress.    Appearance: He is well-developed. He is not diaphoretic.  HENT:     Head: Normocephalic and atraumatic.  Cardiovascular:     Pulses: Normal pulses.  Pulmonary:     Effort: Pulmonary effort is normal.  Abdominal:     Palpations: Abdomen is soft.      Tenderness: There is abdominal tenderness in the left lower quadrant.  Musculoskeletal:     Thoracic back: No tenderness or bony tenderness.     Lumbar back: No tenderness or bony tenderness.     Right lower leg: No edema.     Left lower leg: No edema.  Skin:    General: Skin is warm and dry.     Findings: No bruising, erythema or rash.  Neurological:     Mental Status: He is alert and oriented to person, place, and time.     Sensory: No sensory deficit.     Motor: No weakness.  Psychiatric:        Behavior: Behavior normal.     (all labs ordered are listed, but only abnormal results are displayed) Labs Reviewed  CBC - Abnormal; Notable for the following components:      Result Value   WBC 3.1 (*)    RBC 4.17 (*)    Hemoglobin 10.9 (*)    HCT 33.0 (*)    MCV 79.1 (*)    RDW 15.7 (*)    Platelets 114 (*)    All other components within normal limits  COMPREHENSIVE METABOLIC PANEL WITH GFR - Abnormal; Notable for the following components:   Sodium 134 (*)    CO2 18 (*)    Glucose, Bld 108 (*)    Alkaline Phosphatase 166 (*)    All other components within normal limits    EKG: None  Radiology: CT L-SPINE NO CHARGE Result Date: 11/09/2023 CLINICAL DATA:  Metastatic prostate carcinoma. Worsening low back pain. EXAM: CT LUMBAR SPINE WITHOUT CONTRAST TECHNIQUE: Multidetector CT imaging of the lumbar spine was performed without intravenous contrast administration. Multiplanar CT image reconstructions were also generated. RADIATION DOSE REDUCTION: This exam was performed according to the departmental dose-optimization program which includes automated exposure control, adjustment of the mA and/or kV according to patient size and/or use of iterative reconstruction technique. COMPARISON:  None Available. FINDINGS: Alignment: Normal. Vertebrae: Diffuse sclerotic bone metastases are seen. Mild superior endplate compression fracture of the L2 vertebral body is seen which appears  acute. Chronic fusion seen at T12-L1, with chronic wedge deformity of the T12 vertebral body. Paraspinal and other soft tissues: Unremarkable. Disc levels: No evidence of disc space narrowing. IMPRESSION: Mild superior endplate compression fracture of the L2 vertebral body, which appears acute. Diffuse sclerotic bone metastases. Chronic fusion at T12-L1, with chronic wedge deformity of the T12 vertebral body. Electronically Signed   By: Norleen DELENA Kil M.D.   On: 11/09/2023 05:42   CT ABDOMEN PELVIS WO CONTRAST Result Date: 11/09/2023 CLINICAL DATA:  Abdominal and flank pain. Metastatic prostate carcinoma. * Tracking Code: BO * EXAM: CT ABDOMEN AND PELVIS WITHOUT CONTRAST TECHNIQUE: Multidetector  CT imaging of the abdomen and pelvis was performed following the standard protocol without IV contrast. RADIATION DOSE REDUCTION: This exam was performed according to the departmental dose-optimization program which includes automated exposure control, adjustment of the mA and/or kV according to patient size and/or use of iterative reconstruction technique. COMPARISON:  PET-CT on 05/24/2023 FINDINGS: Lower chest: No acute findings. Hepatobiliary: No mass visualized on this unenhanced exam. Punctate gallstone again noted, without signs of cholecystitis or biliary ductal dilatation. Pancreas: No mass or inflammatory process visualized on this unenhanced exam. Spleen: Stable mild splenomegaly, measuring approximately 14 cm in length. Adrenals/Urinary tract: No evidence of urolithiasis or hydronephrosis. Unremarkable unopacified urinary bladder. Stomach/Bowel: No evidence of obstruction, inflammatory process, or abnormal fluid collections. Normal appendix visualized. Vascular/Lymphatic: No pathologically enlarged lymph nodes identified. No evidence of abdominal aortic aneurysm. Reproductive:  Slightly enlarged prostate gland. Other:  None. Musculoskeletal: Diffuse sclerotic bone metastases are again seen. Healing subacute  pathologic fractures of the left posterolateral 8th rib and right superior pubic ramus are new since prior exam in April. IMPRESSION: Diffuse sclerotic bone metastases, which show no significant change. No evidence of soft tissue metastatic disease. Healing subacute pathologic fractures of the left posterolateral 8th rib and right superior pubic ramus, but new since PET-CT in April. Stable mild splenomegaly. Cholelithiasis. No radiographic evidence of cholecystitis. Electronically Signed   By: Norleen DELENA Kil M.D.   On: 11/09/2023 05:35     Procedures   Medications Ordered in the ED  oxyCODONE  (Oxy IR/ROXICODONE ) immediate release tablet 10 mg (10 mg Oral Given 11/09/23 0733)  ketorolac (TORADOL) 15 MG/ML injection 15 mg (15 mg Intramuscular Given 11/09/23 0736)                                    Medical Decision Making Risk Prescription drug management.   This patient presents to the ED for concern of low back pain with hx of metastatic cancer, this involves an extensive number of treatment options, and is a complaint that carries with it a high risk of complications and morbidity.  The differential diagnosis includes but not limited to compression fracture, cauda equina, colitis, mass   Co morbidities / Chronic conditions that complicate the patient evaluation  Metastatic prostate cancer, AKI, prediabetes, HTN, HLD   Additional history obtained:  Additional history obtained from EMR External records from outside source obtained and reviewed including prior labs and imaging    Lab Tests:  I Ordered, and personally interpreted labs.  The pertinent results include:  CMP with elevated alk phos. CBC without significant change from prior    Imaging Studies ordered:  I ordered imaging studies including CT L-spine, CT abd/pelvis  I independently visualized and interpreted imaging which showed L1 endplate compression fracture I agree with the radiologist interpretation   Problem List  / ED Course / Critical interventions / Medication management  83 year old male with history of metastatic prostate cancer presents with complaint of worsening pain in his back.  Generally managed with tramadol , called his care team yesterday who prescribed him milligram oxycodone , last took last night.  At this time, he is comfortable in bed however as the bed is adjusted for his exam, he experiences return of his back pain. CT with L1 endplate fracture.  Last patient, provided with Toradol and oxycodone  with improvement in his pain.  LSO brace for comfort.  Referred to neurosurgery for follow-up.  Recommend he contact his  care team to discuss pain management. I ordered medication including oxycodone , toradol    Reevaluation of the patient after these medicines showed that the patient pain improved I have reviewed the patients home medicines and have made adjustments as needed   Consultations Obtained:  I requested consultation with the ER attending, Dr. Gennaro,  and discussed lab and imaging findings as well as pertinent plan - they recommend: Agrees with plan of care   Social Determinants of Health:  [facility]   Test / Admission - Considered:  Stable for dc with plan to follow up with his care team and referral to neurosurgery       Final diagnoses:  Acute bilateral low back pain without sciatica  Prostate cancer metastatic to bone [facility])  Closed compression fracture of body of L1 vertebra [facility])    ED Discharge Orders          Ordered    baclofen (LIORESAL) 10 MG tablet  3 times daily        11/09/23 0904               Beverley Leita LABOR, PA-C 11/09/23 1416    Kammerer, Megan L, DO 11/10/23 1001

## 2023-11-09 NOTE — Discharge Instructions (Addendum)
 Follow up with neurosurgery, call to schedule an appointment.  Call your careteam today to discuss pain management.  Wear brace for comfort.  You can take Baclofen as prescribed. Do not drive while taking this medication.

## 2023-11-09 NOTE — ED Notes (Signed)
 Ortho tech called, outside vendor can be contacted at [phone] per ortho and brace will be brought to patient

## 2023-11-09 NOTE — ED Notes (Signed)
 Awaiting outside vendor brace

## 2023-11-11 NOTE — Radiation Completion Notes (Signed)
 Patient Name: Kevin Santiago, Kevin Santiago MRN: [id number] Date of Birth: [birth year] Referring Physician: [facility], M.D. Date of Service: 2023-11-11 Radiation Oncologist: Marcey Penton, M.D. [facility]                             RADIATION ONCOLOGY END OF TREATMENT NOTE     Diagnosis: C79.51 Secondary malignant neoplasm of bone Staging on 2022-06-10: Prostate cancer metastatic to bone (HCC) T=cTX, N=cN1, M=cM1 Intent: Palliative     HPI: Patient is an 83 year old male now out 1 month having completed palliative radiation therapy to his lower thoracic upper lumbar spine for metastatic involvement of prostate cancer.  He is received multiple areas of treatment in the past including his pelvis and other areas of his spine.  He had a good palliative benefit to the radiation therapy although today is complaining of left scapular shoulder pain.  On his PSMA PET scan back in.  April he did have activity in the left scapula.  He did have Pluvicto treatments back in May although only only underwent 1 treatment and had to be discontinued secondary to his blood counts.      ==========DELIVERED PLANS==========  First Treatment Date: 2023-10-31 Last Treatment Date: 2023-11-04   Plan Name: Chest_L_Scap Site: Scapula, Left Technique: Isodose Plan Mode: Photon Dose Per Fraction: 4 Gy Prescribed Dose (Delivered / Prescribed): 20 Gy / 20 Gy Prescribed Fxs (Delivered / Prescribed): 5 / 5     ==========ON TREATMENT VISIT DATES========== 2023-11-01     ==========UPCOMING VISITS========== 12/05/2023 [facility] RAD ONCOLOGY FOLLOW UP 30 Chrystal, Glenn, MD        ==========APPENDIX - ON TREATMENT VISIT NOTES==========   See weekly On Treatment Notes in Epic for details in the Media tab (listed as Progress notes on the On Treatment Visit Dates listed above).

## 2023-11-16 ENCOUNTER — Telehealth: Payer: Self-pay

## 2023-11-16 NOTE — Telephone Encounter (Signed)
 Call made to pt. He states taht his insurance will not allow him to receive oncology care her at [facility].

## 2023-11-25 ENCOUNTER — Telehealth: Payer: Self-pay | Admitting: *Deleted

## 2023-11-25 NOTE — Telephone Encounter (Signed)
 He statesI checked with Dr. Babara she says the same thing that the [facility] says that they will not [facility] for anything here in [place] and cancer.  The patient says that he is right that we should not come to the cancer center however he is okay for radiation.  He says he has some pain medicines and he has been using them and he has a appointment at the [facility] coming up soon.  There is a person that helps him and she is helping him.

## 2023-11-25 NOTE — Telephone Encounter (Signed)
 The patient says that he has lower back pain and they are shooting down his legs and he wanted to have somebody to see him.  I went to talk to Kevin Santiago because thing that was not here before I started a note that is not getting any treatment he is a [facility] patient.  So I did not know whether he could come or not for this pain.  Please let me know what to do

## 2023-11-30 ENCOUNTER — Telehealth: Payer: Self-pay | Admitting: *Deleted

## 2023-11-30 NOTE — Telephone Encounter (Signed)
 Achy from the [facility] wanted to know if Mr. Sells came on October 6 for his radiation and I looked it up and talk to the people in radiation and he did not come.  I left a message to Levon saying that he did not come on 10/ 6.

## 2023-12-05 ENCOUNTER — Ambulatory Visit: Admitting: Radiation Oncology

## 2023-12-11 ENCOUNTER — Emergency Department (HOSPITAL_COMMUNITY)
Admission: EM | Admit: 2023-12-11 | Discharge: 2023-12-11 | Disposition: A | Discharge status: Home / Self Care | Attending: Emergency Medicine | Admitting: Emergency Medicine

## 2023-12-11 ENCOUNTER — Encounter (HOSPITAL_COMMUNITY): Payer: Self-pay

## 2023-12-11 DIAGNOSIS — Z8546 Personal history of malignant neoplasm of prostate: Secondary | ICD-10-CM | POA: Diagnosis not present

## 2023-12-11 DIAGNOSIS — I10 Essential (primary) hypertension: Secondary | ICD-10-CM | POA: Diagnosis not present

## 2023-12-11 DIAGNOSIS — R109 Unspecified abdominal pain: Secondary | ICD-10-CM | POA: Diagnosis not present

## 2023-12-11 DIAGNOSIS — G8929 Other chronic pain: Secondary | ICD-10-CM | POA: Insufficient documentation

## 2023-12-11 DIAGNOSIS — M545 Low back pain, unspecified: Secondary | ICD-10-CM | POA: Insufficient documentation

## 2023-12-11 DIAGNOSIS — Z79899 Other long term (current) drug therapy: Secondary | ICD-10-CM | POA: Insufficient documentation

## 2023-12-11 LAB — BASIC METABOLIC PANEL WITH GFR
Anion gap: 13 (ref 5–15)
BUN: 37 mg/dL — ABNORMAL HIGH (ref 8–23)
CO2: 23 mmol/L (ref 22–32)
Calcium: 8.7 mg/dL — ABNORMAL LOW (ref 8.9–10.3)
Chloride: 99 mmol/L (ref 98–111)
Creatinine, Ser: 1.14 mg/dL (ref 0.61–1.24)
GFR, Estimated: >60 mL/min (ref >60)
Glucose, Bld: 140 mg/dL — ABNORMAL HIGH (ref 70–99)
Potassium: 3.9 mmol/L (ref 3.5–5.1)
Sodium: 135 mmol/L (ref 135–145)

## 2023-12-11 LAB — URINALYSIS, W/ REFLEX TO CULTURE (INFECTION SUSPECTED)
Bilirubin Urine: NEGATIVE
Glucose, UA: NEGATIVE mg/dL
Ketones, ur: NEGATIVE mg/dL
Nitrite: NEGATIVE
Protein, ur: 100 mg/dL — AB
RBC / HPF: >50 RBC/hpf (ref 0–5)
Specific Gravity, Urine: 1.015 (ref 1.005–1.030)
pH: 5 (ref 5.0–8.0)

## 2023-12-11 LAB — CBC WITH DIFFERENTIAL/PLATELET
Abs Immature Granulocytes: 0.06 K/uL (ref 0.00–0.07)
Basophils Absolute: 0 K/uL (ref 0.0–0.1)
Basophils Relative: 0 %
Eosinophils Absolute: 0 K/uL (ref 0.0–0.5)
Eosinophils Relative: 0 %
HCT: 37.7 % — ABNORMAL LOW (ref 39.0–52.0)
Hemoglobin: 11.9 g/dL — ABNORMAL LOW (ref 13.0–17.0)
Immature Granulocytes: 1 %
Lymphocytes Relative: 3 %
Lymphs Abs: 0.2 K/uL — ABNORMAL LOW (ref 0.7–4.0)
MCH: 25.4 pg — ABNORMAL LOW (ref 26.0–34.0)
MCHC: 31.6 g/dL (ref 30.0–36.0)
MCV: 80.6 fL (ref 80.0–100.0)
Monocytes Absolute: 0.4 K/uL (ref 0.1–1.0)
Monocytes Relative: 5 %
Neutro Abs: 7.8 K/uL — ABNORMAL HIGH (ref 1.7–7.7)
Neutrophils Relative %: 91 %
Platelets: 135 K/uL — ABNORMAL LOW (ref 150–400)
RBC: 4.68 MIL/uL (ref 4.22–5.81)
RDW: 15.9 % — ABNORMAL HIGH (ref 11.5–15.5)
WBC: 8.5 K/uL (ref 4.0–10.5)
nRBC: 0 % (ref 0.0–0.2)

## 2023-12-11 MED ORDER — SODIUM CHLORIDE 0.9 % IV SOLN
1.0000 g | Freq: Once | INTRAVENOUS | Status: AC
  Administered 2023-12-11: 1 g via INTRAVENOUS
  Filled 2023-12-11: qty 10

## 2023-12-11 MED ORDER — MORPHINE SULFATE (PF) 4 MG/ML IV SOLN
4.0000 mg | Freq: Once | INTRAVENOUS | Status: AC
  Administered 2023-12-11: 4 mg via INTRAVENOUS
  Filled 2023-12-11: qty 1

## 2023-12-11 MED ORDER — MORPHINE SULFATE (PF) 4 MG/ML IV SOLN: 4.0000 mg | Freq: Once | INTRAVENOUS | Status: DC

## 2023-12-11 MED ORDER — SODIUM CHLORIDE 0.9 % IV BOLUS
500.0000 mL | Freq: Once | INTRAVENOUS | Status: AC
  Administered 2023-12-11: 500 mL via INTRAVENOUS

## 2023-12-11 NOTE — ED Notes (Signed)
 [facility] notified that the patient has been moved to RM 52 in the purple zone.

## 2023-12-11 NOTE — Discharge Instructions (Addendum)
 Follow-up with your hospice doctor.  Return to the emergency room if you have any worsening symptoms.

## 2023-12-11 NOTE — ED Triage Notes (Signed)
 Pt bib [facility] from home for abd and back pain that has lasted the past 2 days. Pt is on home hospice and has hx of prostate cancer. Hospice nurse noted decreased urinary output in last 2 days, the nurses removed old cath and placed new catheter and only got a small amount of urine output with new catheter.     EMS vs  124/88 85 hr 92 % RA baseline.  Cbg 187

## 2023-12-11 NOTE — ED Notes (Signed)
 PTAR has been scheduled for the patient to return home.  No ETA could be provided by dispatch.

## 2023-12-11 NOTE — ED Provider Notes (Signed)
 [place] EMERGENCY DEPARTMENT AT [facility] Provider Note   CSN: [id number] Arrival date & time: 12/11/23  1338     Patient presents with: Abdominal Pain and Back Pain   Kevin Santiago is a 83 y.o. male.   Patient is a 83 year old male who presents with decreased urine output.  He has a history of metastatic prostate cancer.  He was seen here in the ED about a month ago for back pain and had a L2 endplate fracture.  He comes in today with reported decreased urine output from home hospice nurse who is at the patient's home.  Reportedly the nurse had placed a new catheter recently after he had had urinary retention.  Today they noticed it was not draining well and it was replaced but still did not seem to be draining well.  He was sent here to further assess the catheter and potentially do a bladder scan.  This information was obtained from EMS and the patient's son who I talked on the phone with.  The son states that patient has had a pretty big decline over the last month.  He was previously at [facility] in [place].  He was discharged back to his home on October 10 and at that point was ambulating with a walker but a few days later stopped walking and has been bedridden ever since.  He has not had any use of his legs since that time.  He has required high doses of opioid pain medications.  He has had some worsening confusion and hallucinations.       Prior to Admission medications   Medication Sig Start Date End Date Taking? Authorizing Provider  acetaminophen  (TYLENOL ) 500 MG tablet Take 1,000 mg by mouth every 6 (six) hours as needed for moderate pain.    [provider]  atorvastatin  (LIPITOR ) 80 MG tablet Take 80 mg by mouth daily. Patient not taking: Reported on 11/01/2023    [provider]  baclofen (LIORESAL) 10 MG tablet Take 1 tablet (10 mg total) by mouth 3 (three) times daily. 11/09/23   Beverley Leita DELENA, PA-C  Cholecalciferol   (VITAMIN D ) 50 MCG (2000 UT) tablet Take 4,000 Units by mouth daily. Patient not taking: Reported on 11/01/2023    [provider]  darolutamide (NUBEQA) 300 MG tablet Take 300 mg by mouth 2 (two) times daily with a meal. 10/19/22   [provider]  dexamethasone  (DECADRON ) 2 MG tablet Take 2 mg by mouth 2 (two) times daily with a meal.    [provider]  docusate sodium  (COLACE) 50 MG capsule Take 100 mg by mouth 2 (two) times daily.    [provider]  famotidine (PEPCID) 40 MG tablet Take 40 mg by mouth daily. 07/08/22   [provider]  ferrous sulfate 325 (65 FE) MG tablet Take 325 mg by mouth daily as needed (energy). Patient not taking: Reported on 11/01/2023 02/29/20   [provider]  fluorouracil (EFUDEX) 5 % cream Apply 1 application  topically daily as needed. Skin basil cell areas as directed 07/18/20   [provider]  hydroxypropyl methylcellulose (ISOPTO TEARS) 2.5 % ophthalmic solution Place 2 drops into both eyes 2 (two) times daily as needed for dry eyes.    [provider]  losartan (COZAAR) 50 MG tablet Take 50 mg by mouth daily. Patient not taking: Reported on 11/01/2023    [provider]  omeprazole (PRILOSEC) 20 MG capsule Take 20 mg  by mouth daily as needed (acid reflux).    [provider]  oxybutynin (DITROPAN-XL) 5 MG 24 hr tablet TAKE ONE TABLET BY MOUTH DAILY FOR OVERACTIVE BLADDER 04/12/22   [provider]  pravastatin  (PRAVACHOL ) 80 MG tablet Take 80 mg by mouth daily. Patient not taking: Reported on 11/01/2023    [provider]  sertraline  (ZOLOFT ) 100 MG tablet Take 100 mg by mouth 2 (two) times daily.    [provider]  tamsulosin  (FLOMAX ) 0.4 MG CAPS capsule Take 0.4 mg by mouth at bedtime.    [provider]  terbinafine (LAMISIL) 1 % cream Apply 1 application  topically 2 (two) times daily as needed (itching).    [provider]   ticagrelor  (BRILINTA ) 90 MG TABS tablet Take 90 mg by mouth 2 (two) times daily. 05/11/22   [provider]    Allergies: Alendronate sodium, Nitroglycerin , Simvastatin, Other, and Penicillins    Review of Systems  Unable to perform ROS: Mental status change    Updated Vital Signs BP (!) 137/93   Pulse 85   Temp 97.7 F (36.5 C) (Axillary)   Resp 20   SpO2 99%   Physical Exam Constitutional:      Appearance: He is well-developed. He is ill-appearing.     Comments: Disheveled  HENT:     Head: Normocephalic and atraumatic.  Eyes:     Pupils: Pupils are equal, round, and reactive to light.  Cardiovascular:     Rate and Rhythm: Normal rate and regular rhythm.     Heart sounds: Normal heart sounds.  Pulmonary:     Effort: Pulmonary effort is normal. No respiratory distress.     Breath sounds: Normal breath sounds. No wheezing or rales.  Chest:     Chest wall: No tenderness.  Abdominal:     General: Bowel sounds are normal.     Palpations: Abdomen is soft.     Tenderness: There is no abdominal tenderness. There is no guarding or rebound.  Genitourinary:    Comments: Foley catheter in place, it is draining dark yellow urine Musculoskeletal:        General: Normal range of motion.     Cervical back: Normal range of motion and neck supple.  Lymphadenopathy:     Cervical: No cervical adenopathy.  Skin:    General: Skin is warm and dry.     Findings: No rash.  Neurological:     Mental Status: He is alert.     Comments: Patient is oriented to person only.  He does tell me the year is 25.  He says he knows where he is but cannot tell me the name of the place that he is.  He is moving his upper extremities well.  He has no movement of his lower extremities.     (all labs ordered are listed, but only abnormal results are displayed) Labs Reviewed  BASIC METABOLIC PANEL WITH GFR - Abnormal; Notable for the following components:      Result Value   Glucose, Bld 140  (*)    BUN 37 (*)    Calcium  8.7 (*)    All other components within normal limits  CBC WITH DIFFERENTIAL/PLATELET - Abnormal; Notable for the following components:   Hemoglobin 11.9 (*)    HCT 37.7 (*)    MCH 25.4 (*)    RDW 15.9 (*)    Platelets 135 (*)    Neutro Abs 7.8 (*)    Lymphs Abs  0.2 (*)    All other components within normal limits  URINALYSIS, W/ REFLEX TO CULTURE (INFECTION SUSPECTED) - Abnormal; Notable for the following components:   APPearance HAZY (*)    Hgb urine dipstick LARGE (*)    Protein, ur 100 (*)    Leukocytes,Ua LARGE (*)    Bacteria, UA RARE (*)    All other components within normal limits  URINE CULTURE    EKG: None  Radiology: No results found.   Procedures   Medications Ordered in the ED  sodium chloride  0.9 % bolus 500 mL (0 mLs Intravenous Stopped 12/11/23 1539)  morphine  (PF) 4 MG/ML injection 4 mg (4 mg Intravenous Given 12/11/23 1501)  cefTRIAXone (ROCEPHIN) 1 g in sodium chloride  0.9 % 100 mL IVPB (0 g Intravenous Stopped 12/11/23 1620)                                    Medical Decision Making Amount and/or Complexity of Data Reviewed Labs: ordered.  Risk Prescription drug management.   This patient presents to the ED for concern of back pain, abdominal pain, this involves an extensive number of treatment options, and is a complaint that carries with it a high risk of complications and morbidity.  I considered the following differential and admission for this acute, potentially life threatening condition.  The differential diagnosis includes metastatic disease, spinal fracture, cauda equina, bowel obstruction, infection, UTI  MDM:    Patient is a 83 year old male who has metastatic prostate cancer who presents with reports of back and abdominal pain.  However he is disoriented.  No family is at bedside.  Discussed with patient's son, Thresa as well as hospice nurse on-call for [employer].  It seems that patient is in hospice and is  comfort care.  His son does not want any aggressive treatment although does want his pain managed.  The hospice nurse also indicated that patient is comfort care only and does not want any aggressive treatments although can take oral antibiotics.  His urine does look infected.  Gave him a dose of antibiotics here in the ED and the hospice nurse has taken a verbal order to start patient on Keflex as an outpatient 500 mg twice a day for 7 days.  He does have profound weakness in his lower extremities which the son says is been going on for about the last 3 weeks.  He does not seem to move his legs at all.  He could have a spinal lesion that is causing this although it is clear that he is comfort care and does not want any aggressive treatment so I do not feel that any imaging is indicated.  The hospice nurse does indicate that they have room to go up on his home medications to help with better pain control at home.  Updated the son on this.  He will be discharged back to his home with hospice care.  (Labs, imaging, consults)  Labs: I Ordered, and personally interpreted labs.  The pertinent results include: Mild anemia, urinary tract infection  Imaging Studies ordered: I ordered imaging studies including   I independently visualized and interpreted imaging. I agree with the radiologist interpretation  Additional history obtained from son, hospice nurse.  External records from outside source obtained and reviewed including history, goals of care  Cardiac Monitoring: The patient was maintained on a cardiac monitor.  If on the cardiac monitor, I  personally viewed and interpreted the cardiac monitored which showed an underlying rhythm of: Sinus rhythm  Reevaluation: After the interventions noted above, I reevaluated the patient and found that they have :stayed the same  Social Determinants of Health:    Disposition: Discharged to home  Co morbidities that complicate the patient evaluation  Past  Medical History:  Diagnosis Date   Anxiety    Depression    Hypercholesteremia    Hypertension      Medicines Meds ordered this encounter  Medications   sodium chloride  0.9 % bolus 500 mL   morphine  (PF) 4 MG/ML injection 4 mg   cefTRIAXone (ROCEPHIN) 1 g in sodium chloride  0.9 % 100 mL IVPB    Antibiotic Indication::   UTI    I have reviewed the patients home medicines and have made adjustments as needed  Problem List / ED Course: Problem List Items Addressed This Visit   None Visit Diagnoses       Chronic low back pain, unspecified back pain laterality, unspecified whether sciatica present    -  Primary   Relevant Medications   morphine  (PF) 4 MG/ML injection 4 mg (Completed)     Abdominal pain, unspecified abdominal location                    Final diagnoses:  Chronic low back pain, unspecified back pain laterality, unspecified whether sciatica present  Abdominal pain, unspecified abdominal location    ED Discharge Orders     None          Lenor Hollering, MD 12/11/23 1715

## 2023-12-12 ENCOUNTER — Ambulatory Visit: Admitting: Radiation Oncology

## 2023-12-12 LAB — URINE CULTURE: Culture: NO GROWTH

## 2024-01-02 DEATH — deceased
